# Patient Record
Sex: Male | Born: 1950 | Hispanic: No | Marital: Married
Health system: Northeastern US, Academic
[De-identification: ages and names within clinical notes are randomized; demographics above are authoritative.]

---

## 2020-01-25 IMAGING — CR Foot R
3 series · 3 of 3 positions shown · non-contrast
Comparison: none

Exam: Right foot 3 views
Patient has a history of an ulcer on the foot.
Significant soft tissue abnormality involving the lateral aspect of the foot at the base of the fifth toe. Erosive changes involving the proximal phalanx of the fifth toe and the distal aspect of the fifth metatarsal. Probable pathologic fractures through the base of the proximal phalanx of the fifth toe and the distal aspect of the fifth metatarsal. No radiopaque foreign body. No other bony abnormality.

[AP]
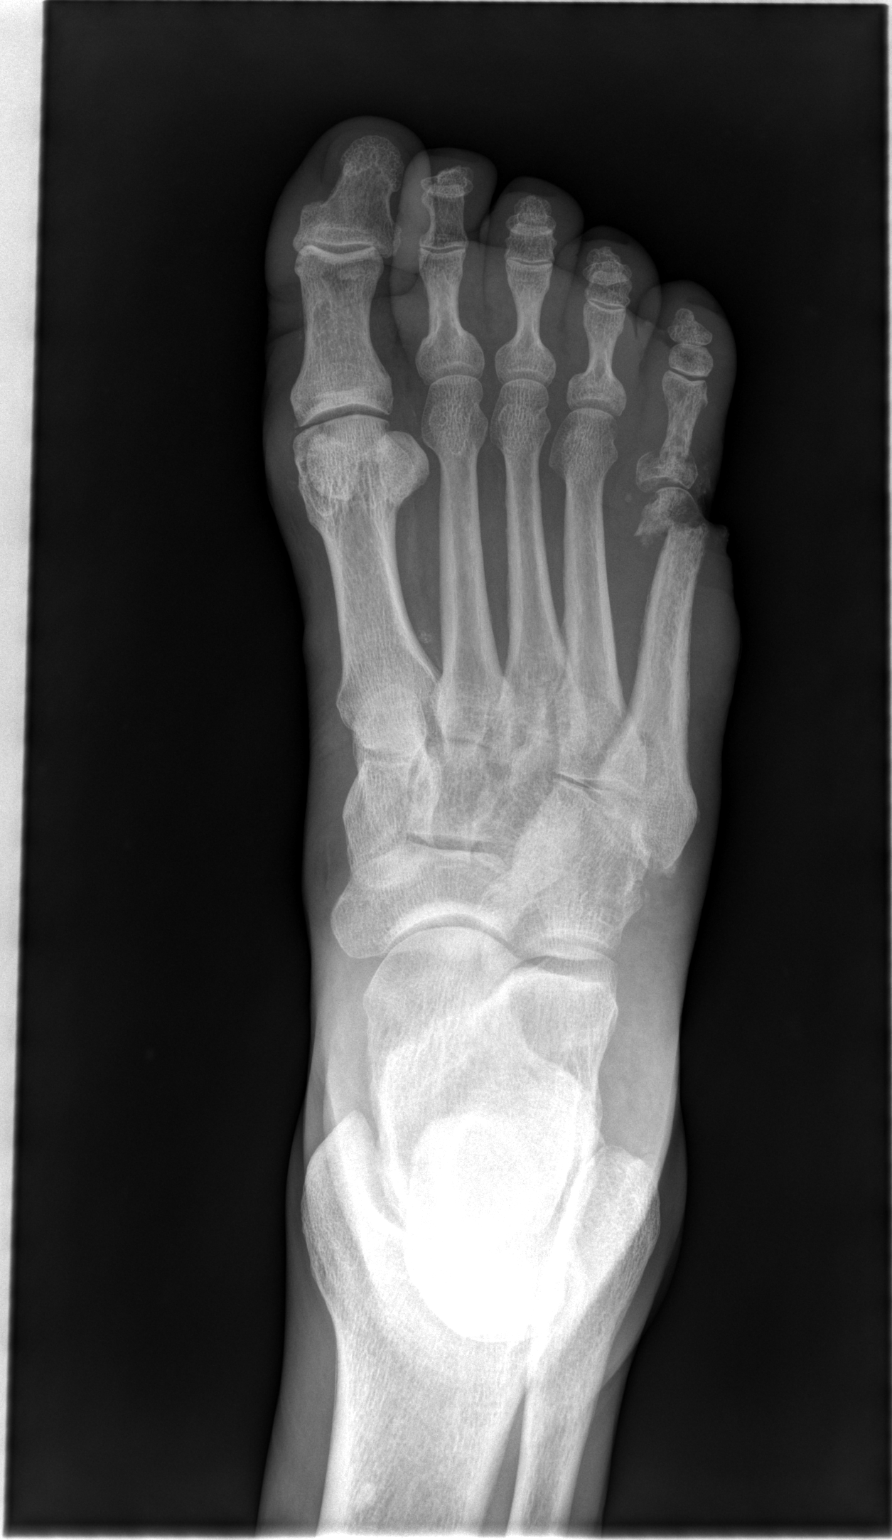

[oblique]
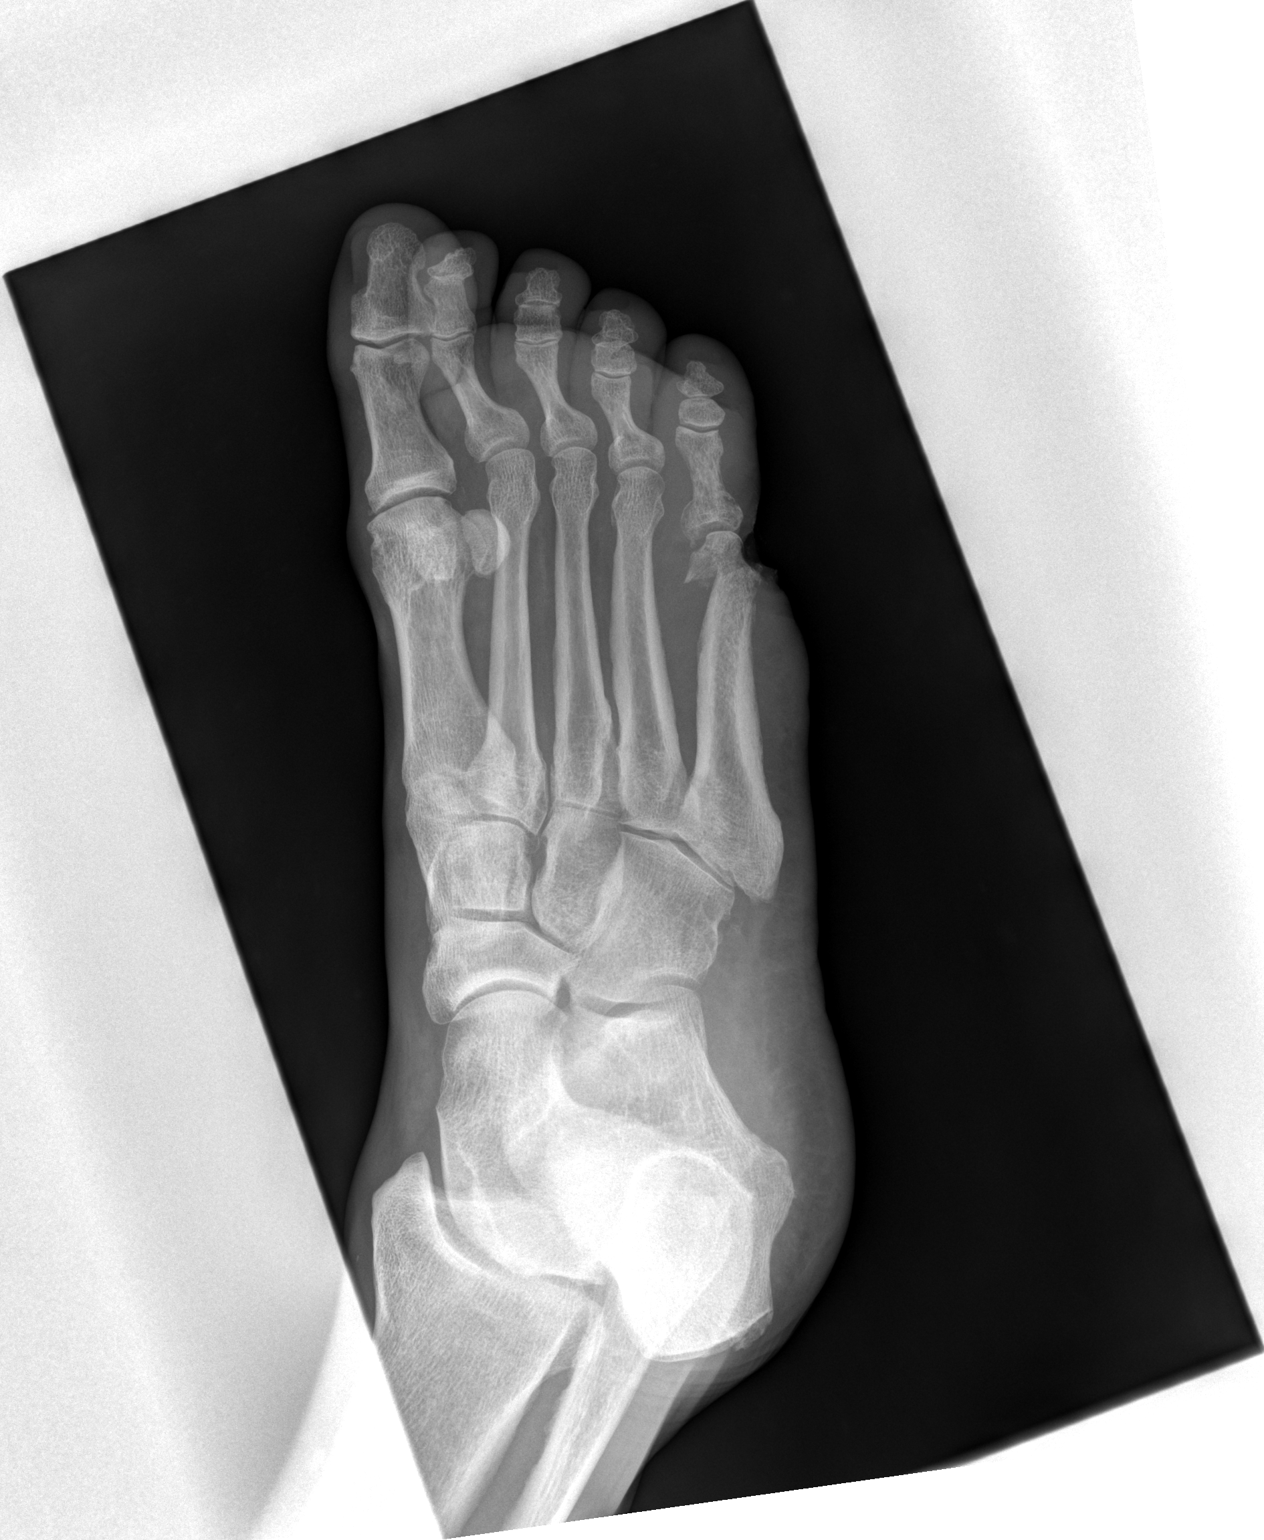

[lateral]
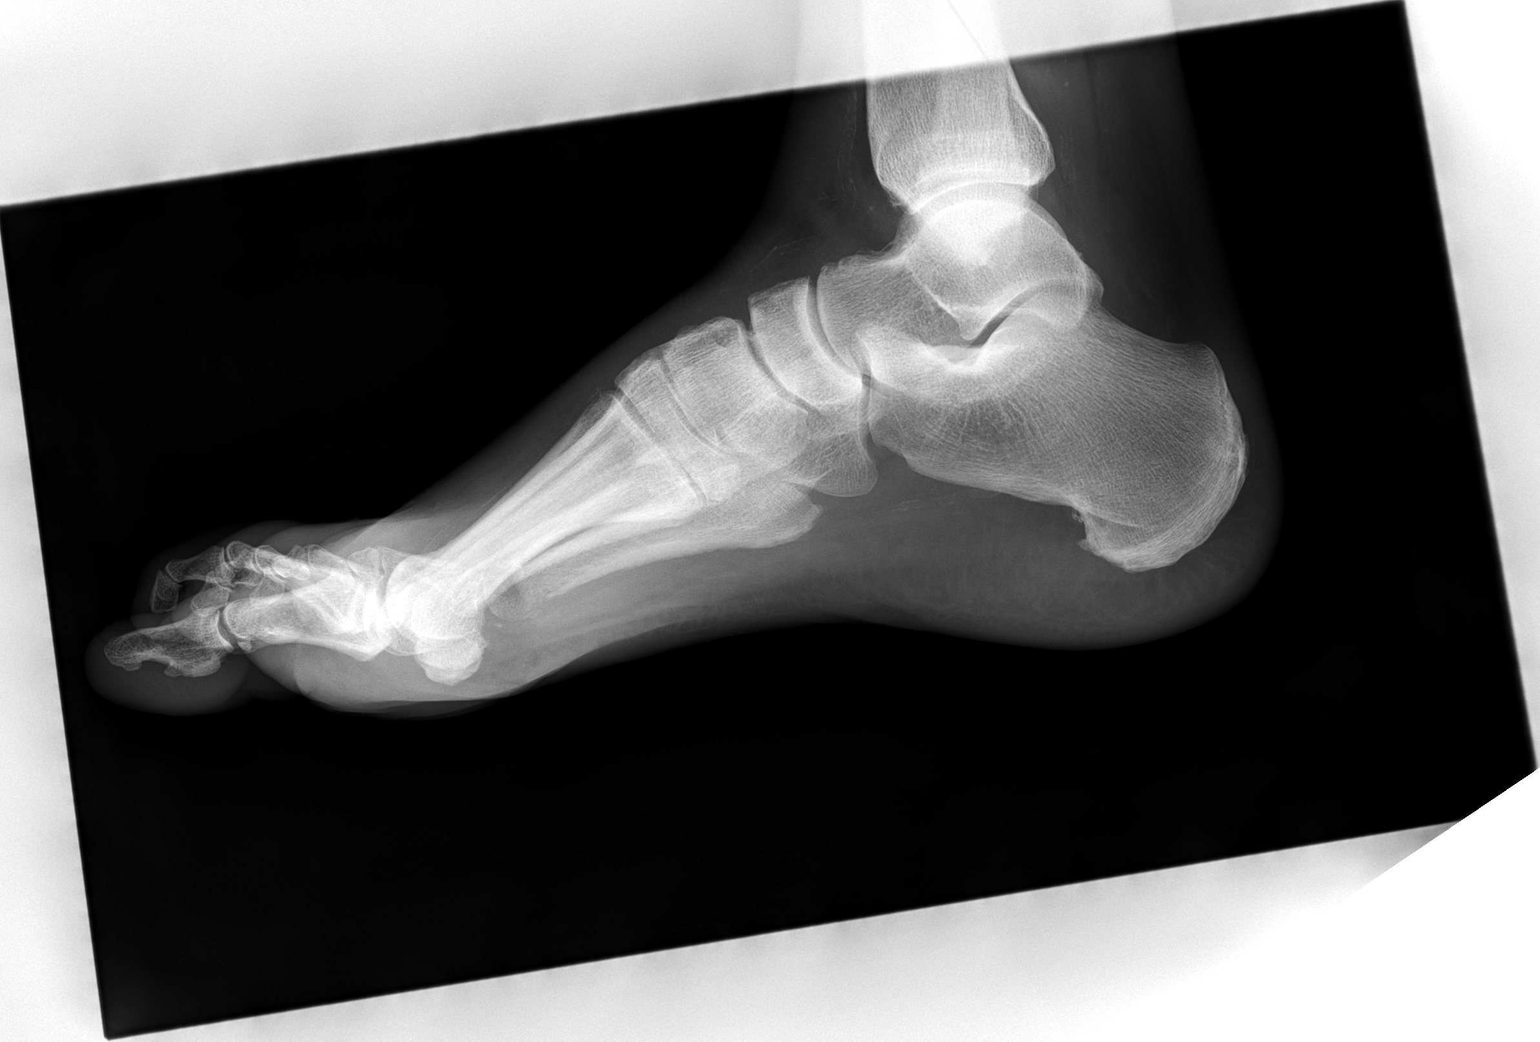

[3 of 3 positions shown; findings below may reference images not displayed]

IMPRESSION: Significant soft tissue abnormality at the level the fifth metatarsal phalangeal joint. Probable osteomyelitis involving the proximal phalanx of the fifth toe and the distal aspect of the fifth metatarsal. Probable pathologic fractures through the proximal phalanx of the fifth toe and the distal aspect of the fifth metatarsal.

## 2020-01-25 IMAGING — US US LOW EXT ARTERIES RIGHT
1 series · 13 of 16 positions shown · non-contrast
Comparison: none

Exam: Right lower extremity arterial ultrasound
CLINICAL HISTORY: Right-sided diabetic foot ulcer hypertension

[Series 1: us low ext arteries right · 13 of 44 slices shown]
[im 1/44]
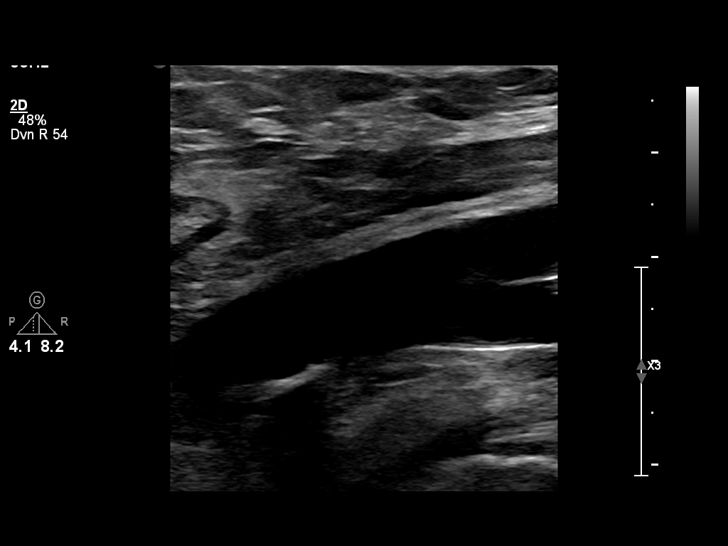
[im 3/44]
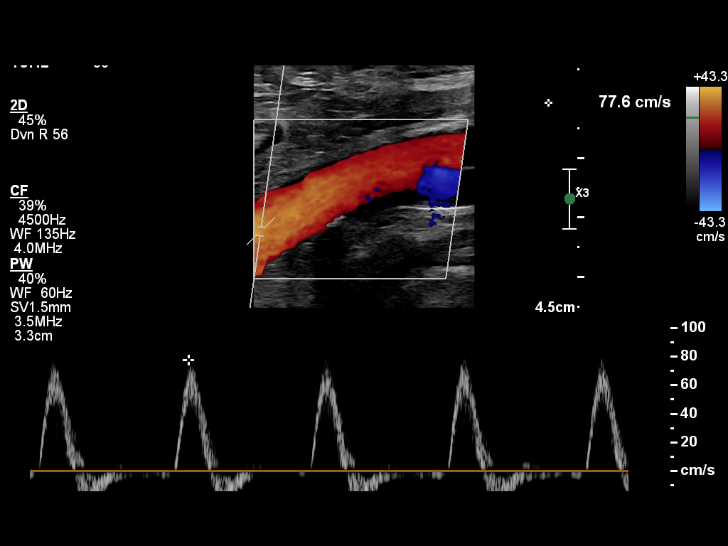
[im 9/44]
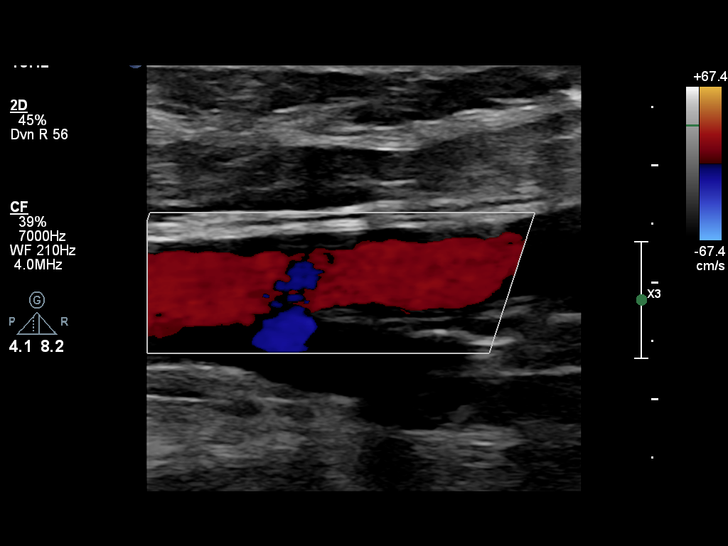
[im 12/44]
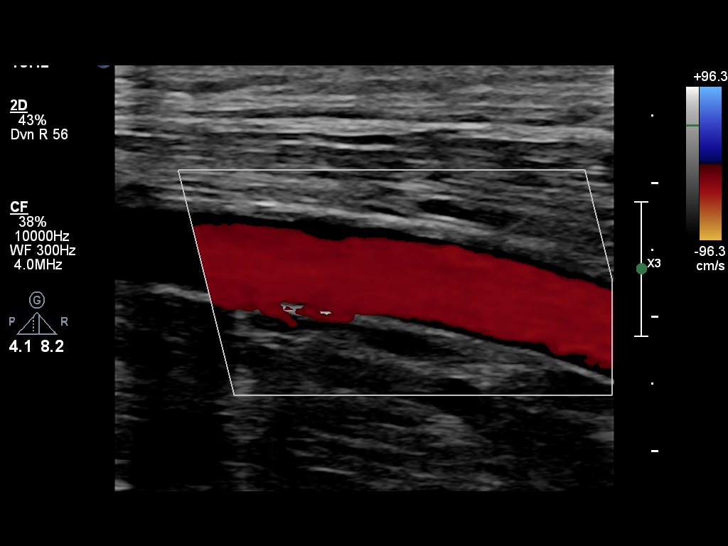
[im 15/44]
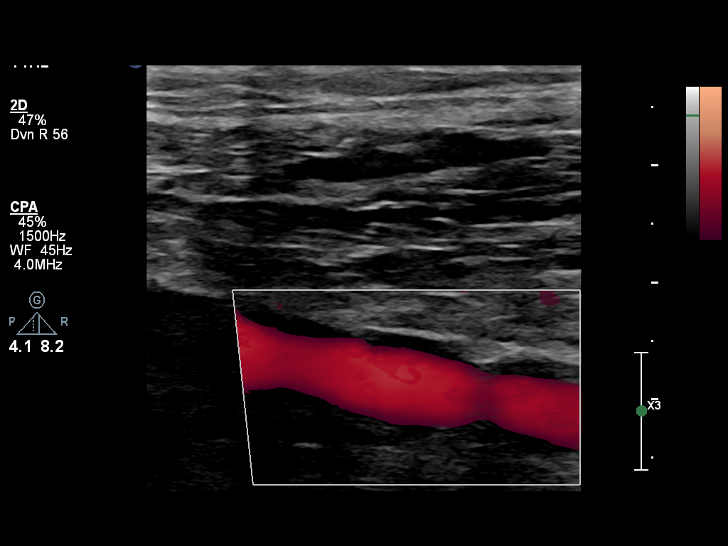
[im 18/44]
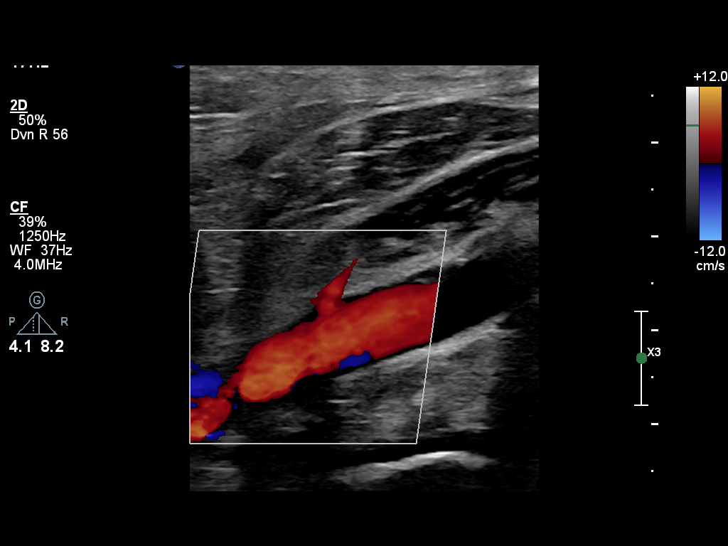
[im 23/44]
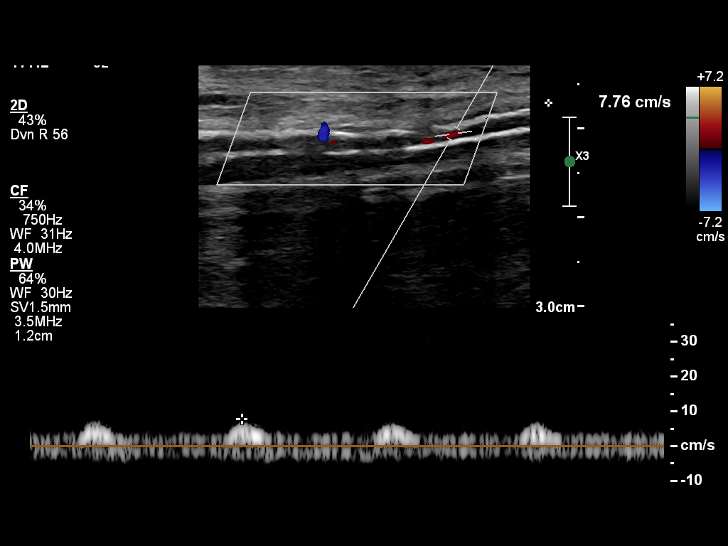
[im 26/44]
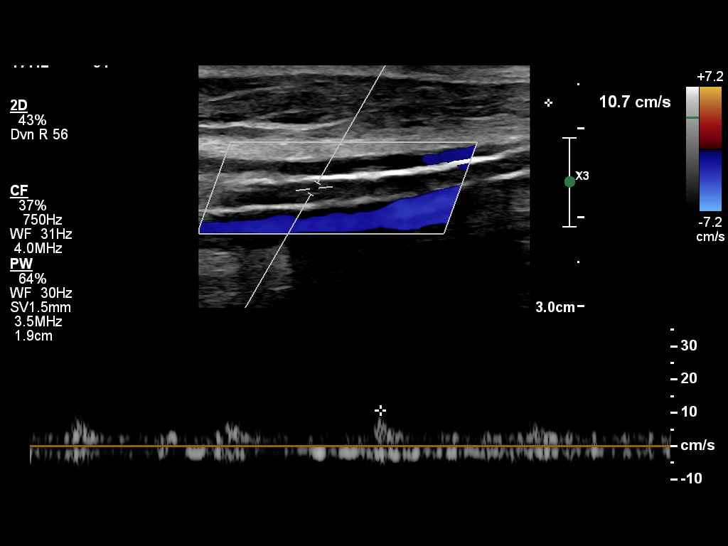
[im 29/44]
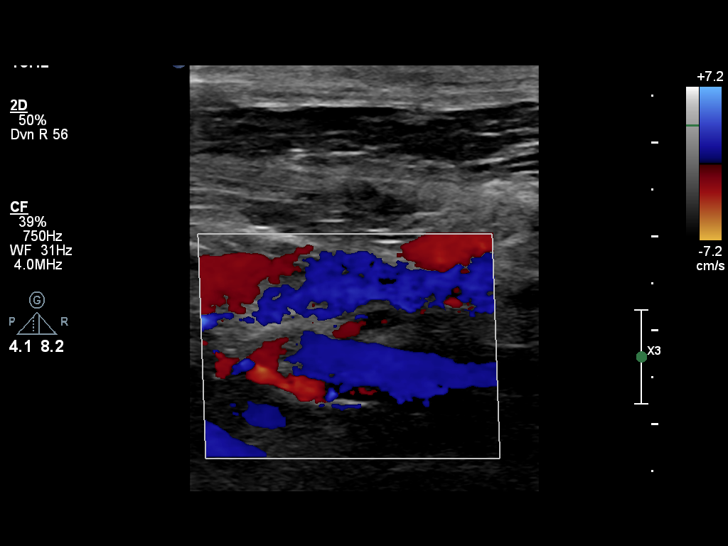
[im 32/44]
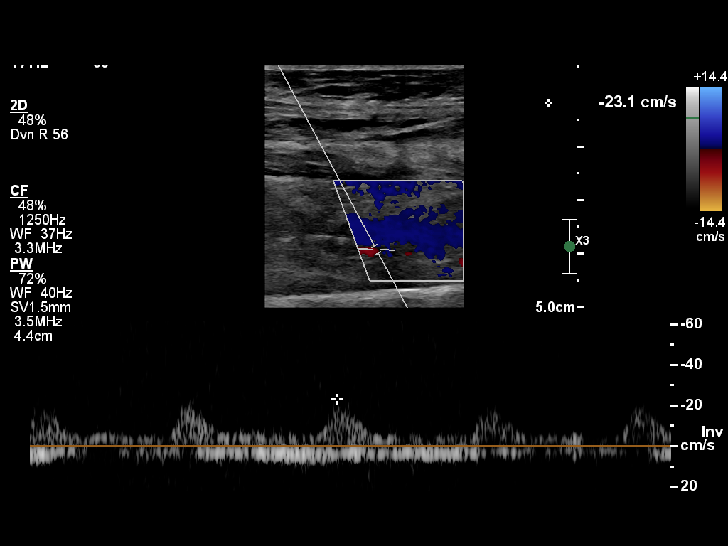
[im 35/44]
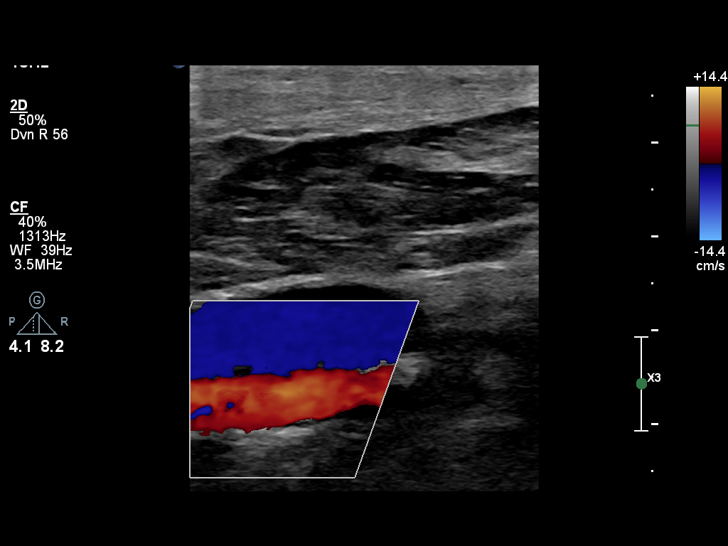
[im 41/44]
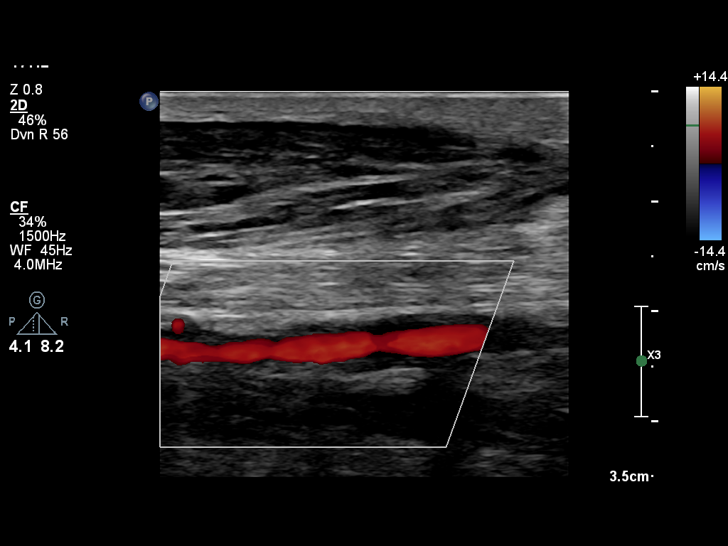
[im 44/44]
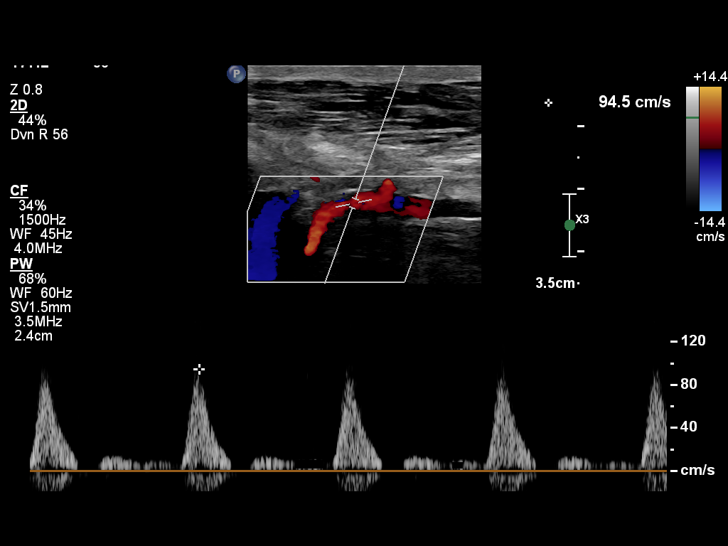

[13 of 16 positions shown; findings below may reference images not displayed]

FINDINGS: The right side: There is biphasic to triphasic waveform in the right common femoral artery. There is biphasic waveform in the right profunda femoris artery. There is triphasic waveform in the proximal mid and distal right superficial femoral artery. There is triphasic waveform in the proximal popliteal artery and the distal popliteal artery.
In the right posterior tibial artery there is biphasic waveform in the proximal segment with peak systolic velocity of 34 cm/s. In the mid posterior tibial artery the flow is very muted and monophasic at 11 cm/s. There is muted and slow flow that is monophasic in the distal right posterior tibial artery peak systolic velocity of 8 cm/s. This suggests significant stenosis in the proximal to mid posterior tibial artery.
In the right anterior tibial artery there is biphasic waveform in the proximal and mid segments.. There is biphasic waveform in the distal segment. There is only monophasic waveform significantly muted and slow in the right dorsalis pedis artery with peak systolic velocity of 12 cm/s. This suggests stenosis in the distal right anterior tibial artery.
There is monophasic waveform throughout the peroneal artery.
IMPRESSION: The appearance of likely stenosis, significant in the mid right posterior tibial and distal anterior tibial arteries as there is monophasic and muted flow more distally. Please see detailed discussion above.
Location 12

## 2020-03-22 IMAGING — US US LOW EXT ARTERIES BILAT
1 series · 13 of 16 positions shown · non-contrast
Comparison: none

REASON FOR EXAM: Peripheral arterial disease
STUDY PERFORMED: Bilateral lower extremity arterial duplex.

[Series 1: us low ext arteries bilat · 13 of 21 slices shown]
[im 1/21]
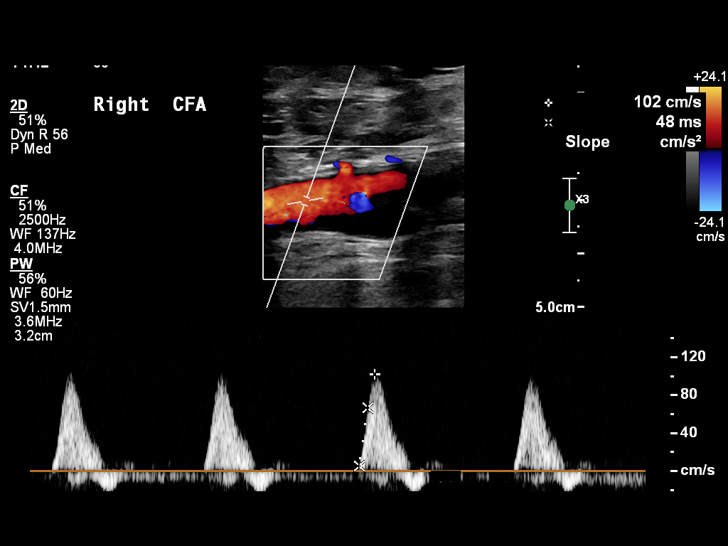
[im 2/21]
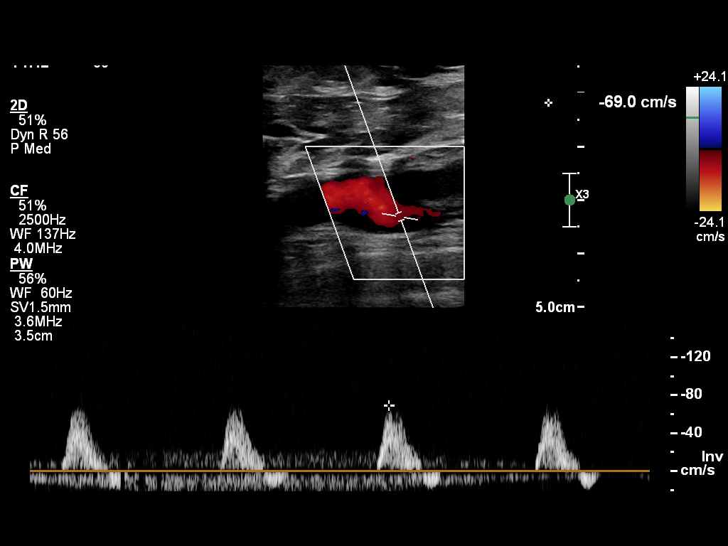
[im 5/21]
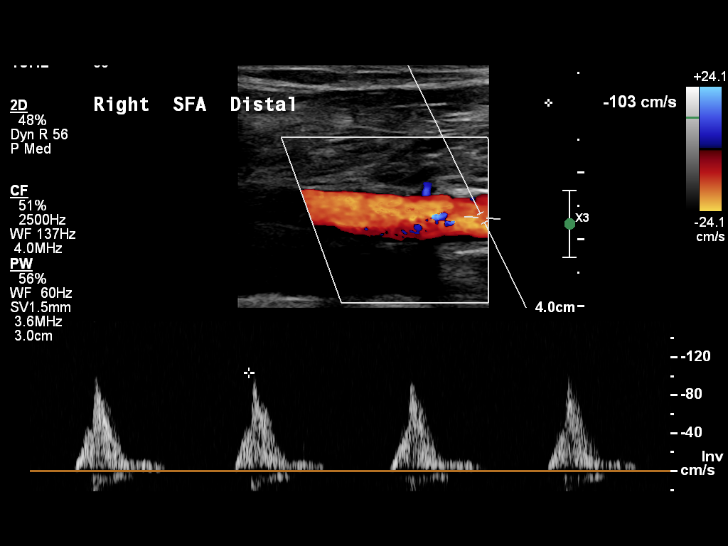
[im 6/21]
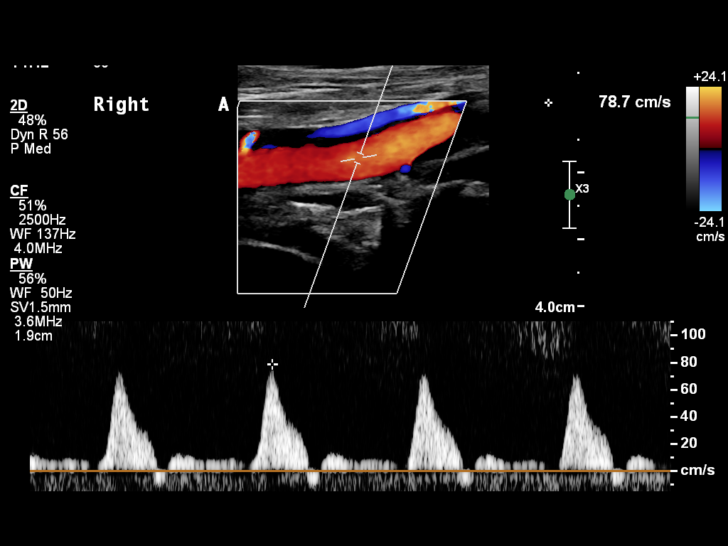
[im 7/21]
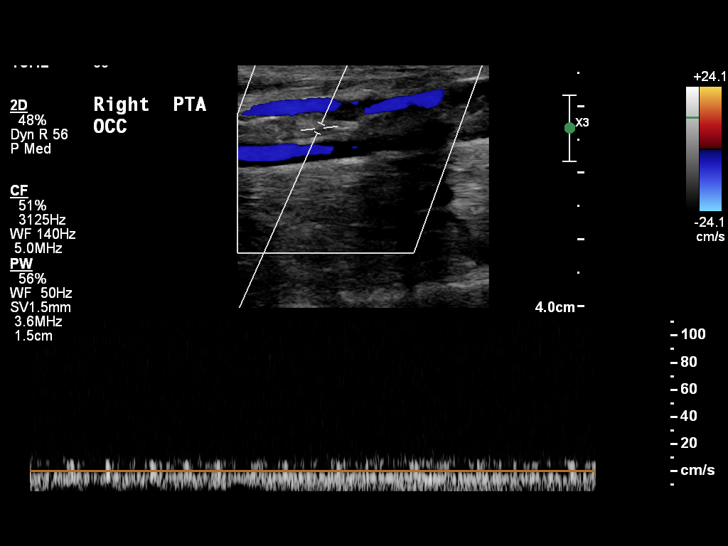
[im 9/21]
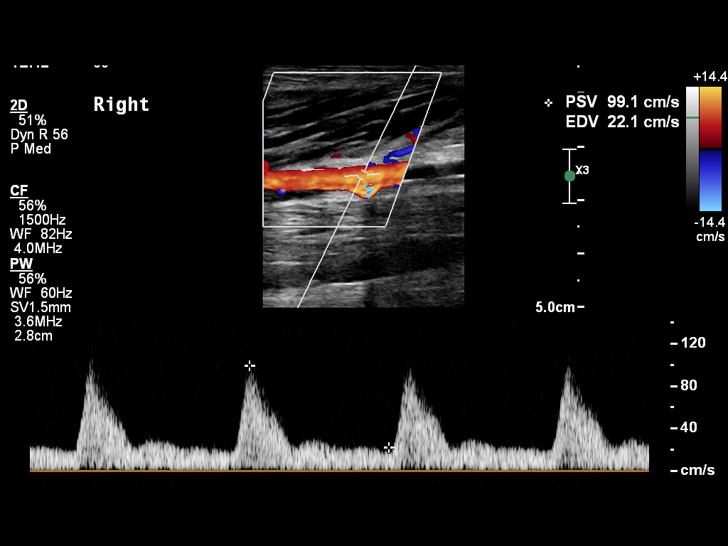
[im 11/21]
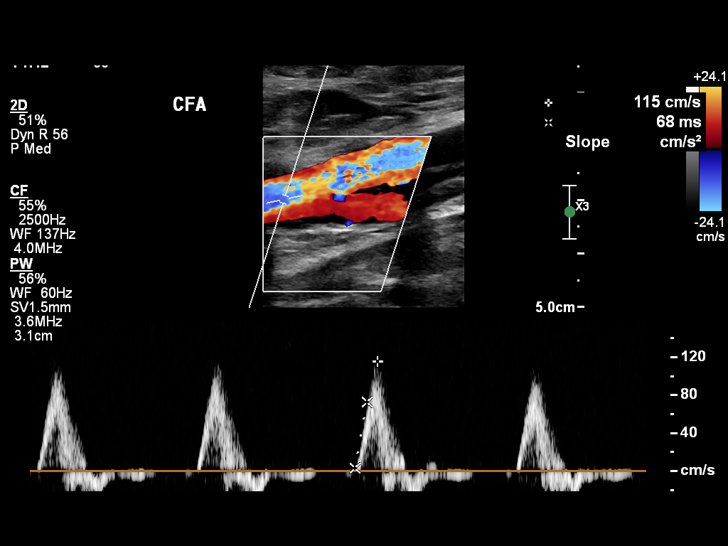
[im 13/21]
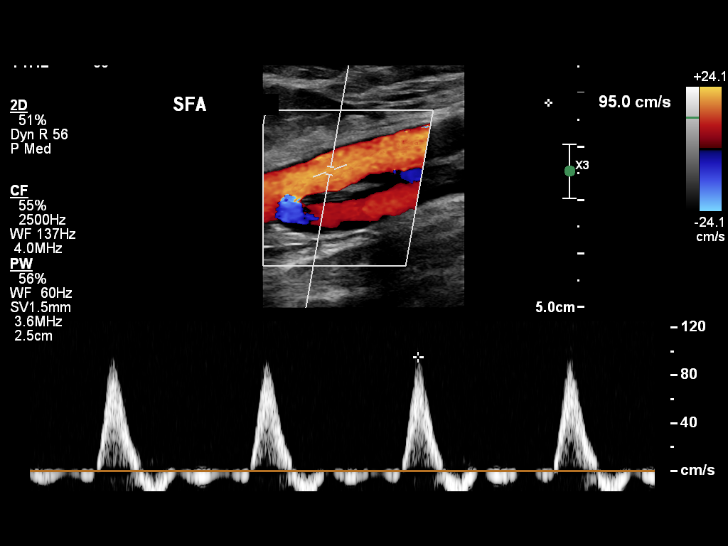
[im 14/21]
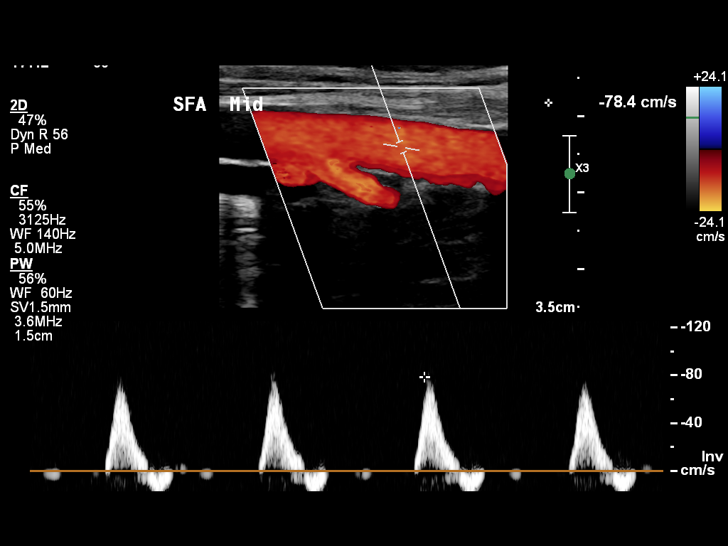
[im 15/21]
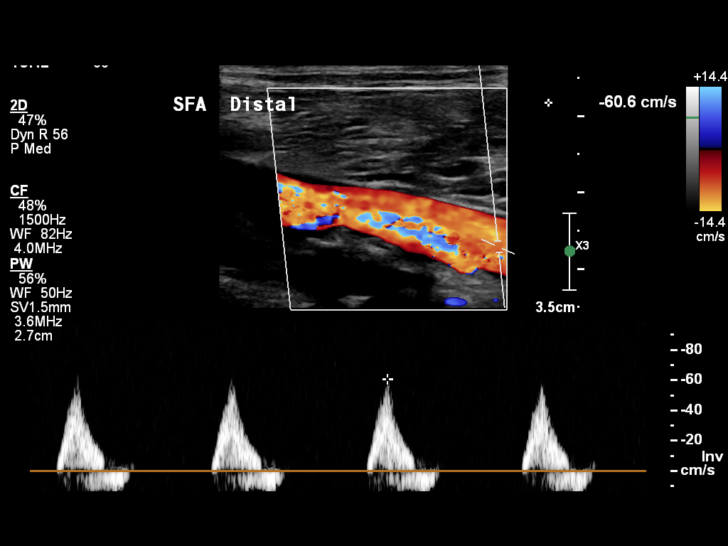
[im 17/21]
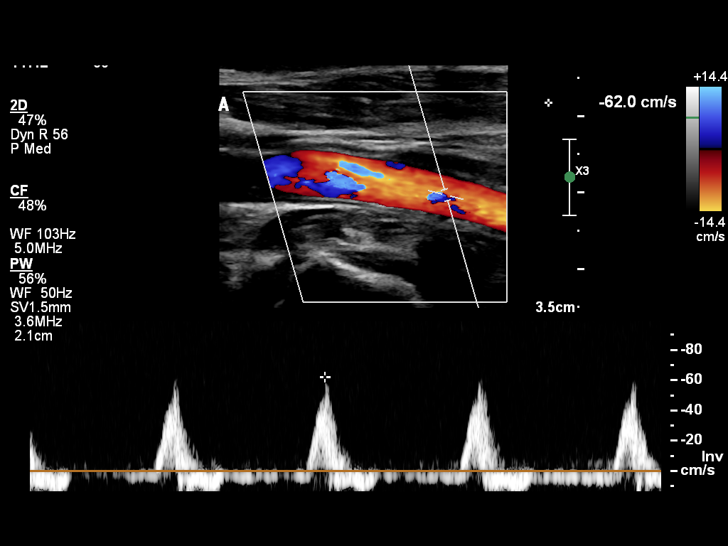
[im 19/21]
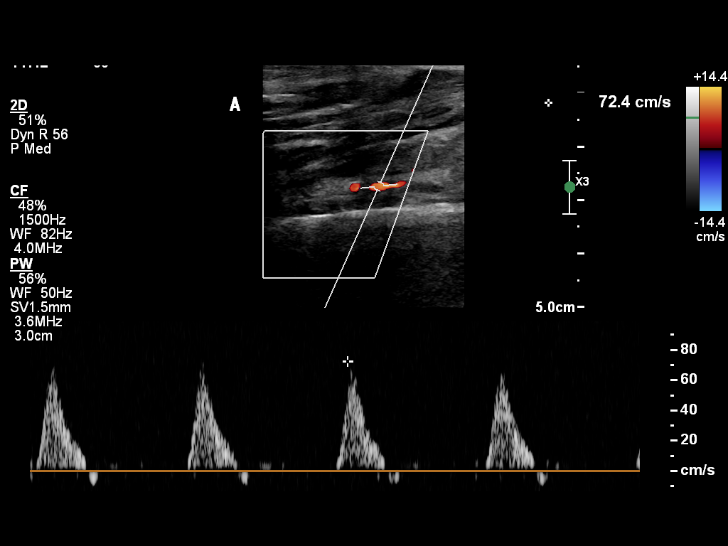
[im 21/21]
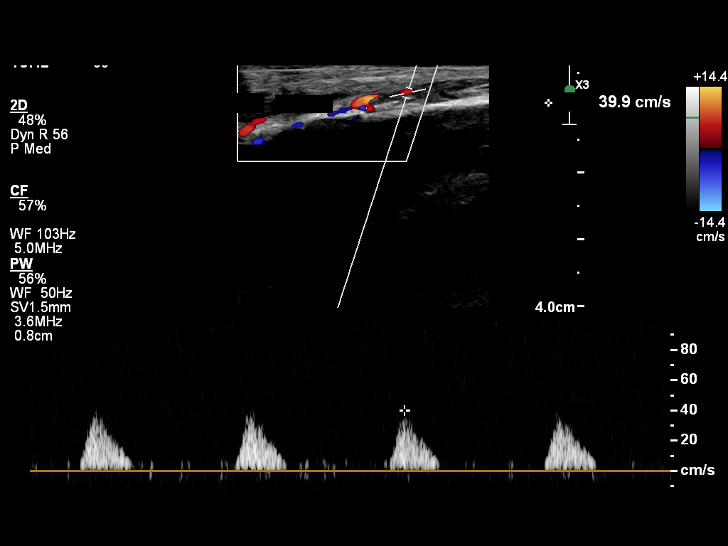

[13 of 16 positions shown; findings below may reference images not displayed]

FINDINGS: Right common femoral artery is patent with normal-appearing biphasic waveform. Right profunda femoris artery is patent. Right superficial femoral artery is patent with no focal velocity elevation to suggest stenosis. Right popliteal artery is patent with maintained biphasic waveform, as are the peroneal, anterior tibial, and dorsalis pedis arteries. Posterior tibial artery is occluded.
Left common femoral artery  patent with normal-appearing biphasic waveform. Left profunda femoris artery is patent. Left superficial femoral artery is patent with no focal velocity elevation to suggest stenosis. Left popliteal artery is patent with maintained biphasic waveform, as is the peroneal artery. The posterior tibial artery is occluded. Anterior tibial is retrograde with dorsalis pedis reconstituted antegrade likely from peroneal collaterals with relatively good velocity 40 cm/s.
IMPRESSION: 1.  Tibial vessel occlusive disease is seen bilaterally with the right posterior tibial artery occluded and left lower extremity with peroneal artery only runoff.

## 2021-06-18 IMAGING — CR XR FOOT 3+ VIEWS LEFT
1 series · 3 of 3 positions shown · non-contrast
Comparison: 421

FINAL REPORT:
XR FOOT 3+ VIEWS LEFT
INDICATION: Cellulitis of left lower limb

[Series 3656: AP · right · 3 of 3 slices shown]
[im 1/3]
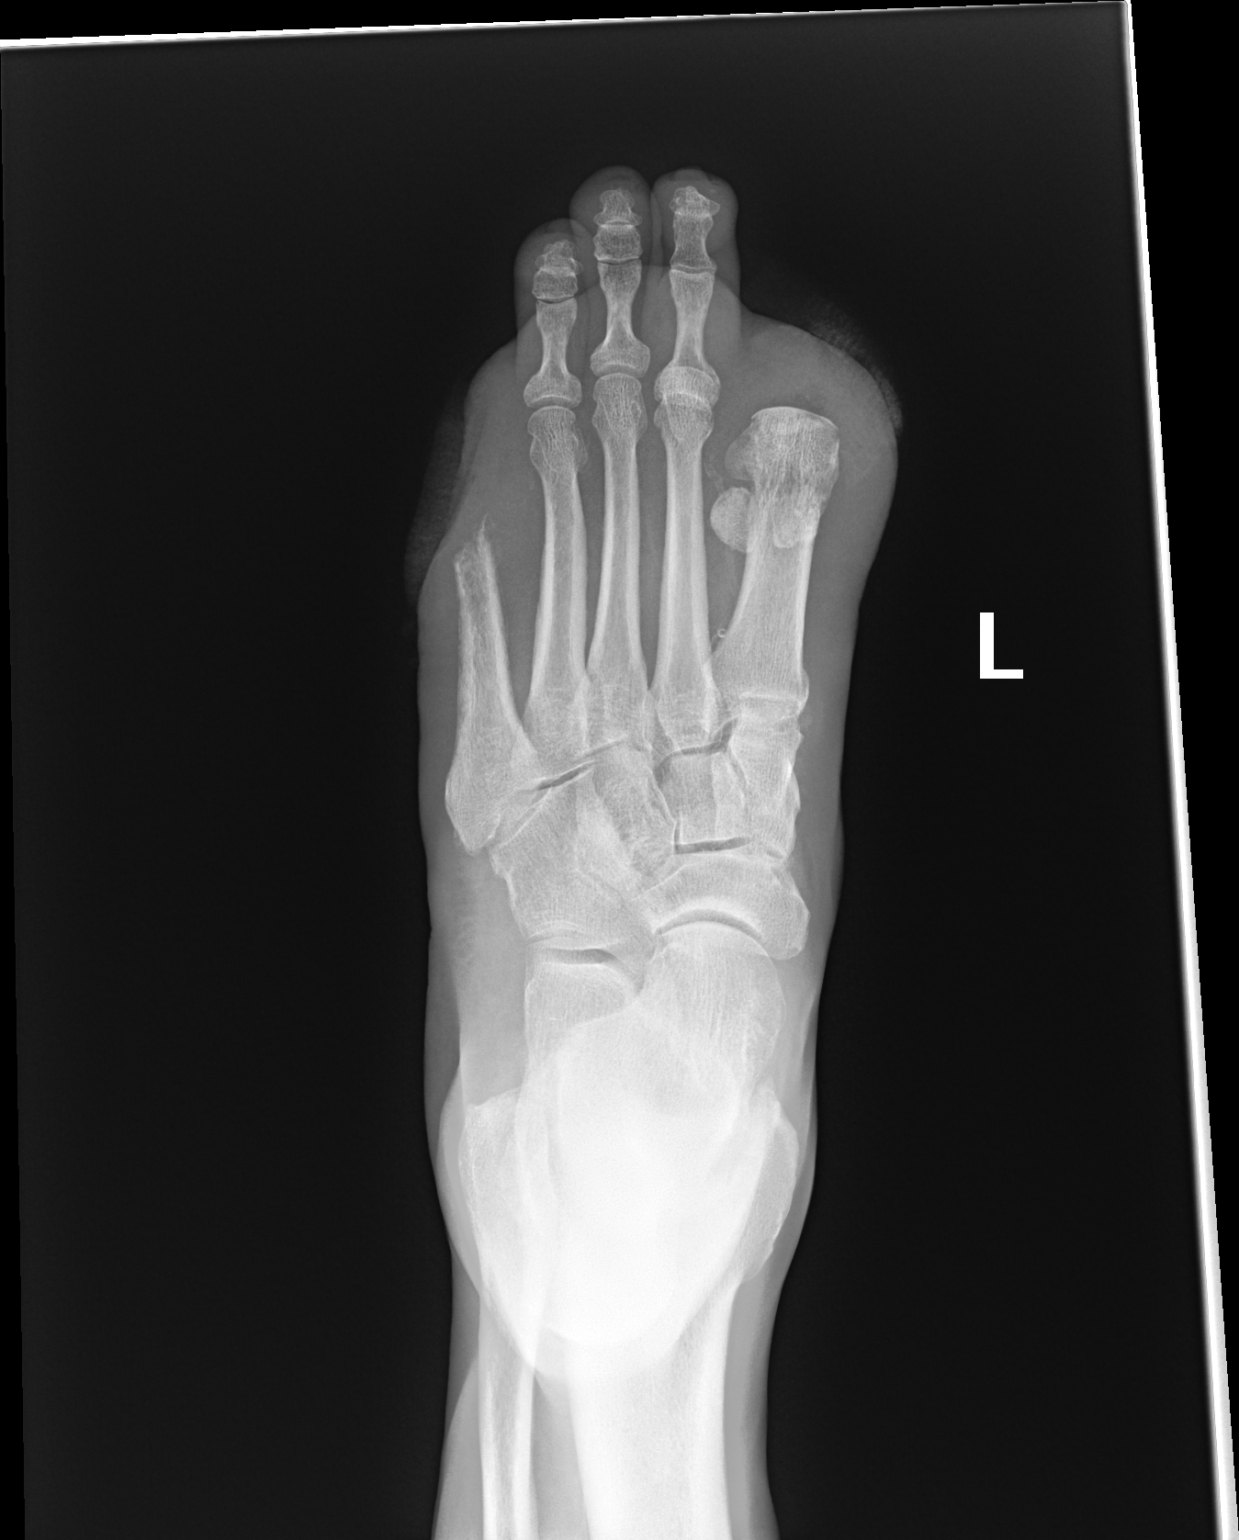
[im 2/3]
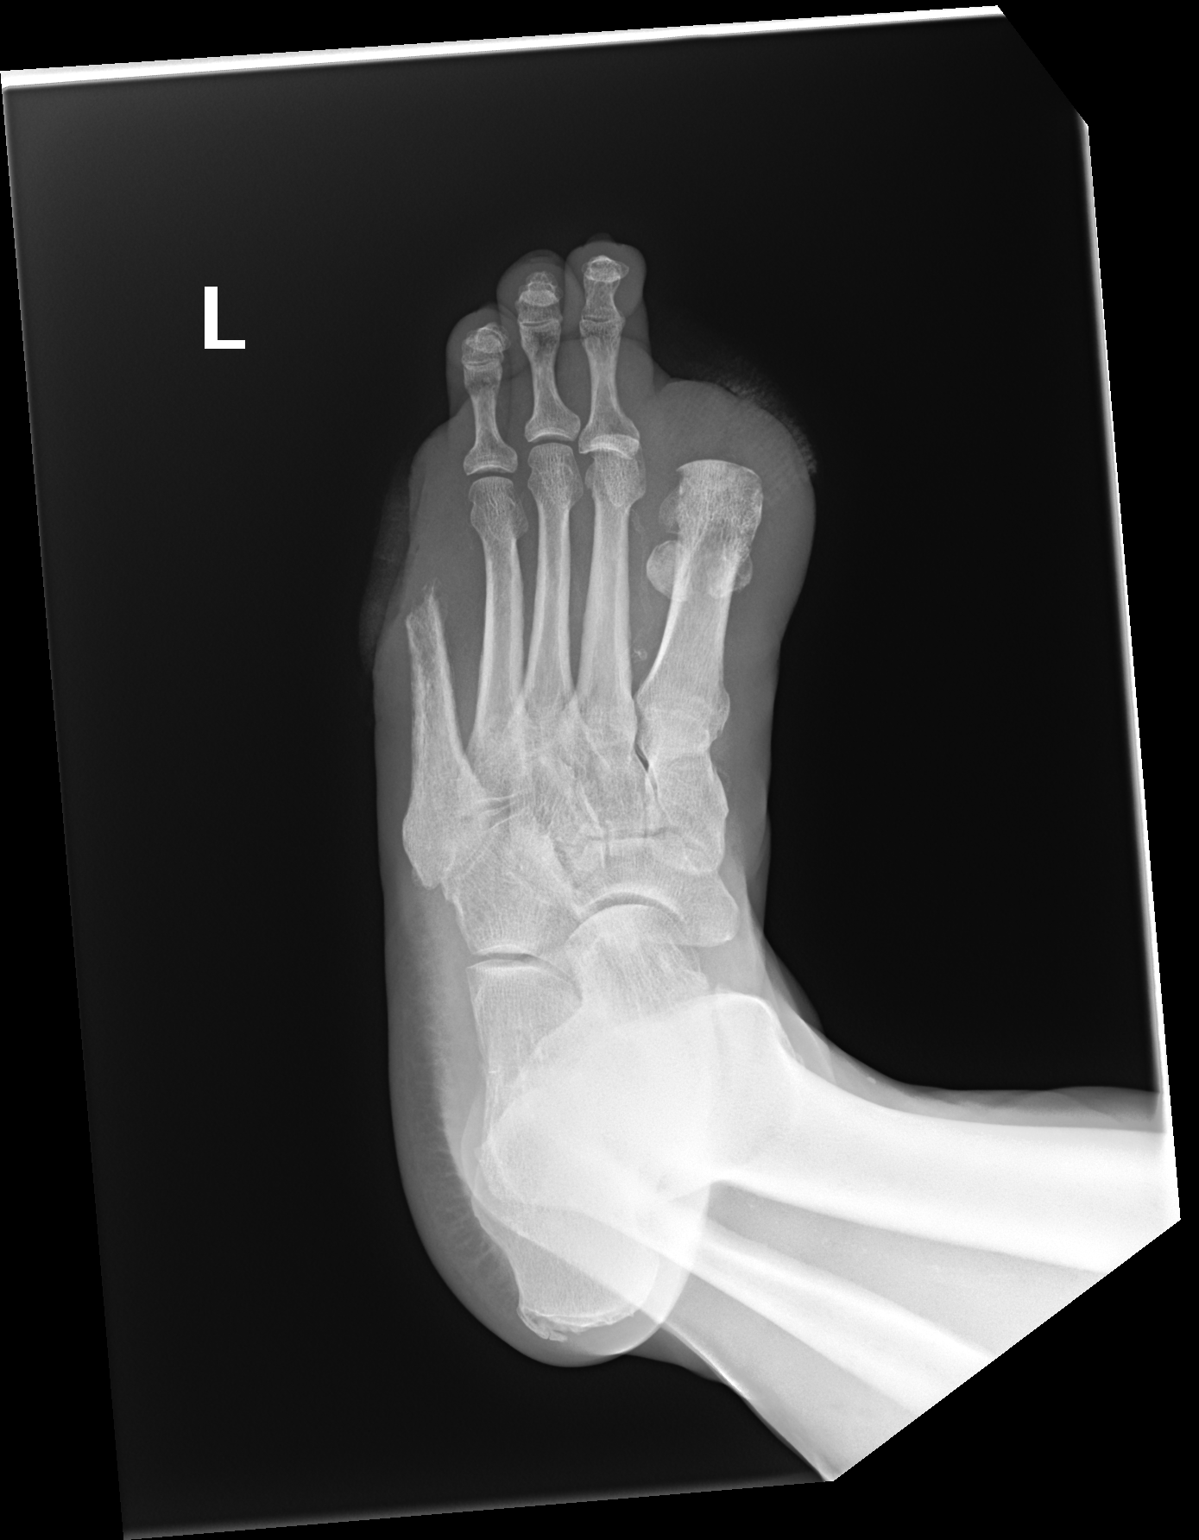
[im 3/3]
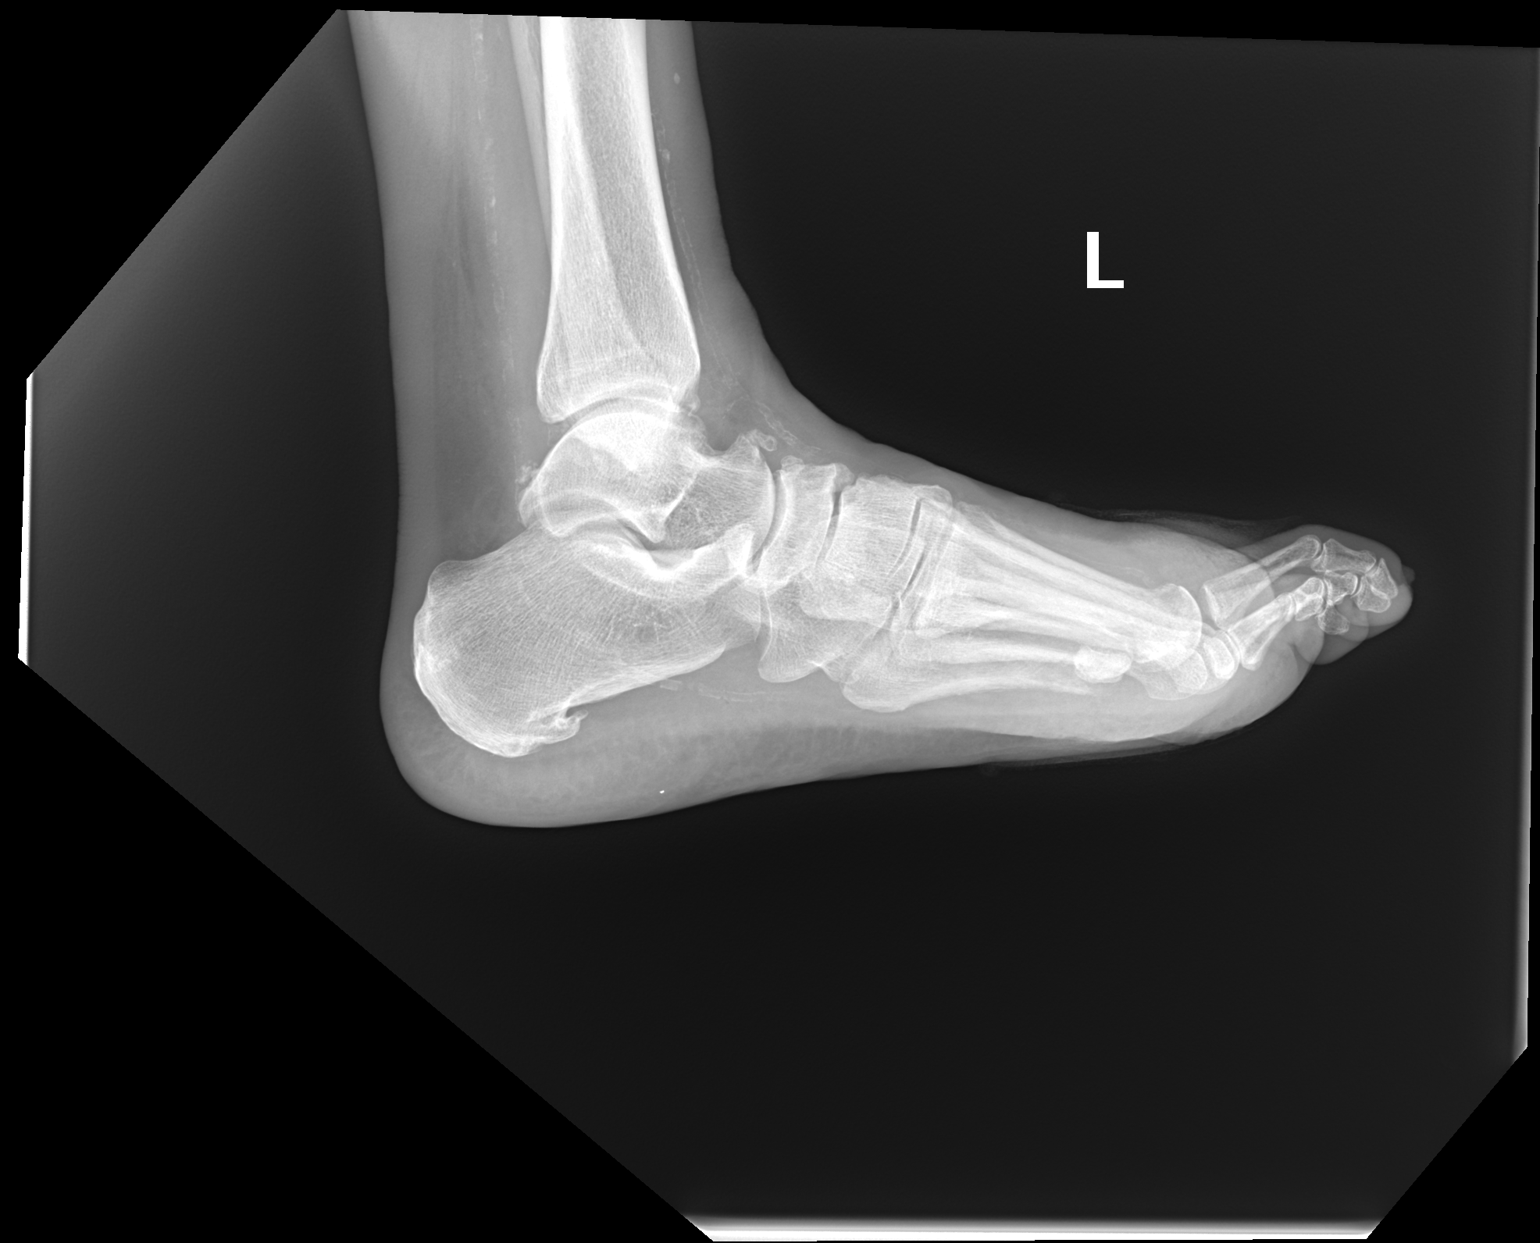

[3 of 3 positions shown; findings below may reference images not displayed]

FINDINGS: 3 views were obtained.
There has been resection of the first digit at the first metatarsophalangeal joint and fifth digits in the transmetatarsal location. There is a plantar calcaneal spur. Mild soft tissue swelling is seen over both amputation sites. No periosteal reaction or erosions or joint space abnormality is identified. Joint spaces are maintained. There are vascular calcifications throughout the colon and foot. There is a small plantar calcaneal spur.
IMPRESSION: Postoperative changes as above without findings of osteomyelitis. If this is persistent clinical concern recommend three-phase bone scan. Tissue swelling is likely due to cellulitis.

## 2021-07-04 IMAGING — MR MRI FOOT LEFT WITH AND WITHOUT IV CONTRAST
10 series · 40 of 40 positions shown · IV contrast (agent unspecified)
Comparison: None available

FINAL REPORT:
HISTORY: Foot ulceration
Procedure: MRI of the left foot without and with contrast
TECHNIQUE: Multisequence, multiplanar imaging of the left foot was performed.

[Series 2: survey · axial · left · 6.0mm · 0.59mm/px · 1 of 11 slices shown]
[im 1/11]
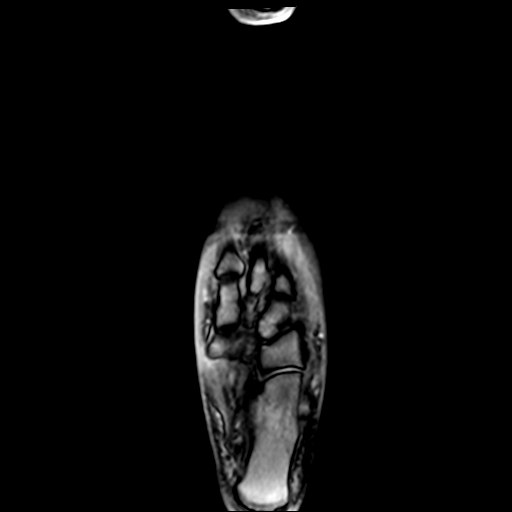

[Series 3: T2 fat-sat · coronal · left · 4.0mm · 0.47mm/px · 5 of 30 slices shown (1 of 2)]
[im 1/30]
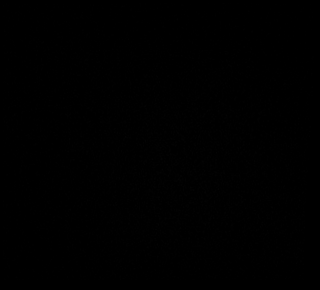
[im 8/30]
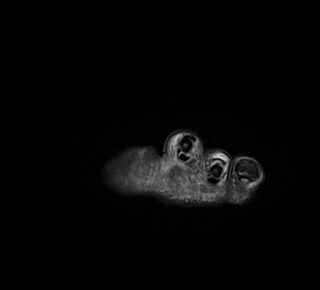
[im 15/30]
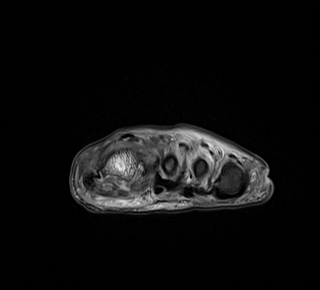
[im 22/30]
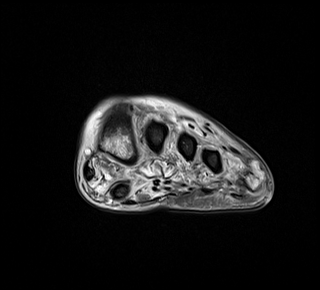
[im 30/30]
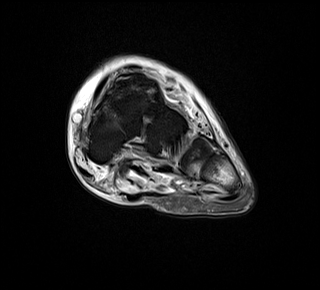

[Series 4: T1 · coronal · left · 4.0mm · 0.47mm/px · 5 of 30 slices shown (1 of 3)]
[im 1/30]
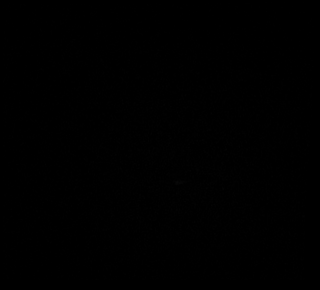
[im 8/30]
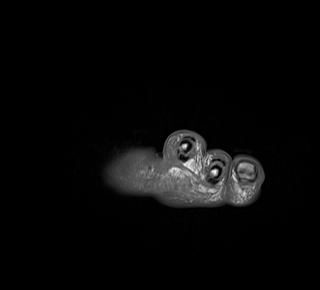
[im 15/30]
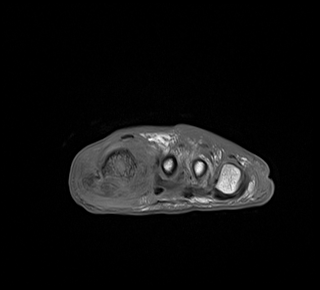
[im 22/30]
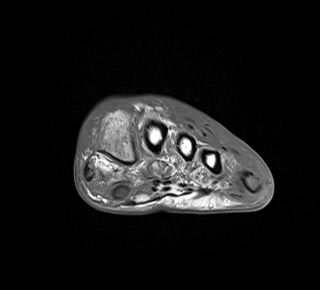
[im 30/30]
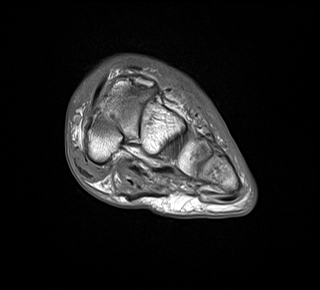

[Series 5: T1 · axial · left · 4.0mm · 0.42mm/px · z∈[-65,+18]mm · 3 of 20 slices shown (2 of 3)]
[im 1/20]
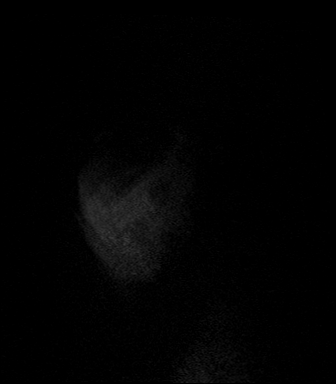
[im 10/20]
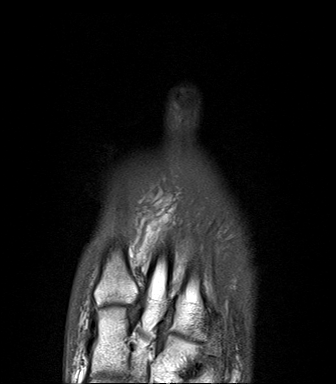
[im 20/20]
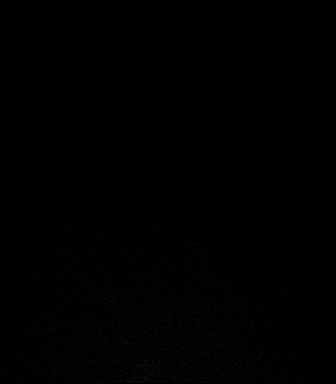

[Series 6: T2 fat-sat · axial · left · 4.0mm · 0.42mm/px · z∈[-65,+18]mm · 3 of 20 slices shown (2 of 2)]
[im 1/20]
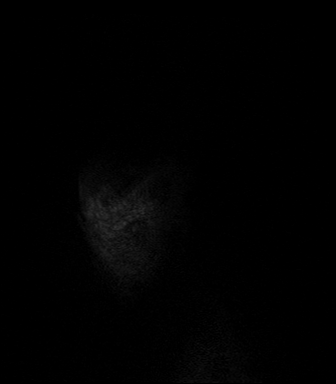
[im 10/20]
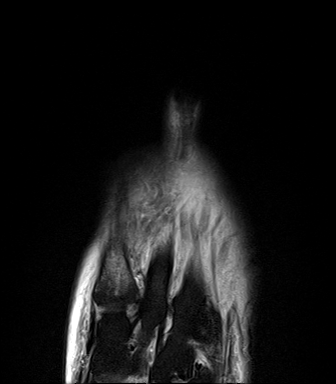
[im 20/20]
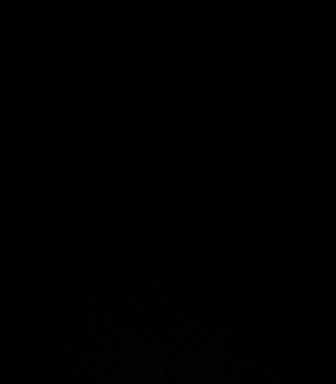

[Series 7: STIR · sagittal · left · 3.0mm · 0.50mm/px · 5 of 30 slices shown]
[im 1/30]
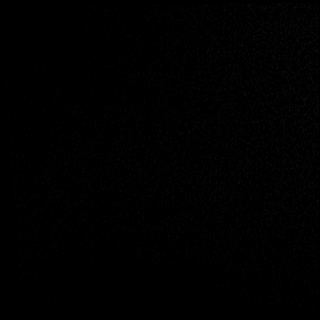
[im 8/30]
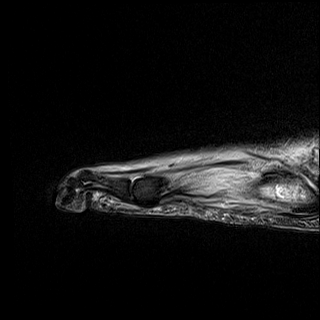
[im 15/30]
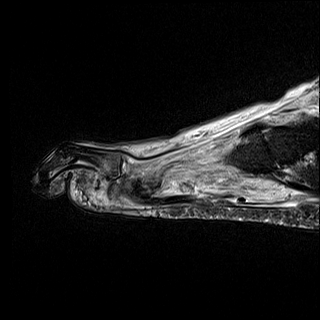
[im 22/30]
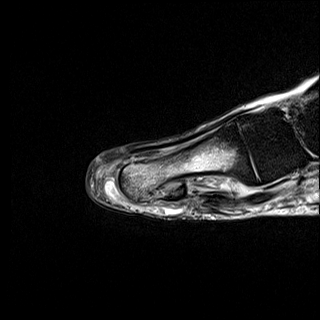
[im 30/30]
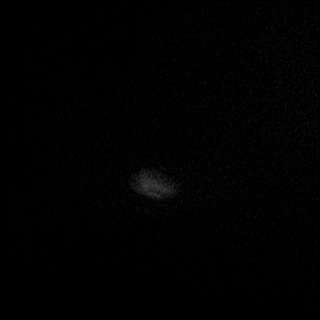

[Series 8: T1 · sagittal · left · 3.0mm · 0.50mm/px · 5 of 30 slices shown (3 of 3)]
[im 1/30]
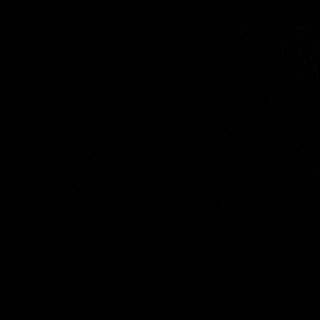
[im 8/30]
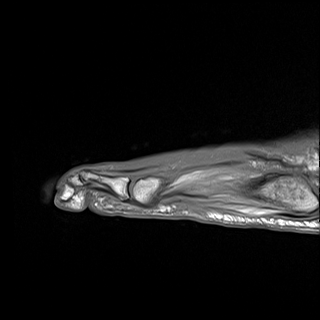
[im 15/30]
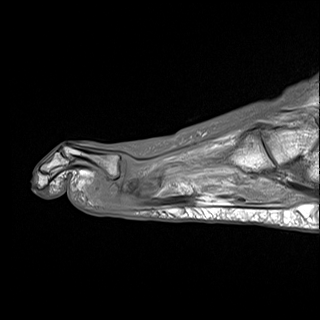
[im 22/30]
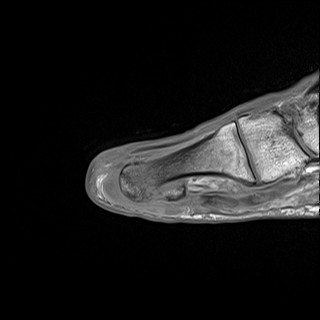
[im 30/30]
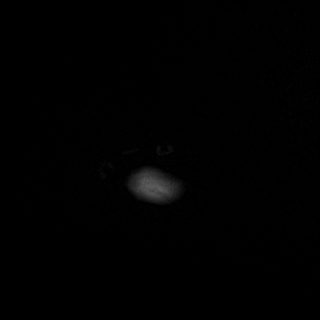

[Series 9: T1 fat-sat post-contrast · axial · left · 4.0mm · 0.42mm/px · z∈[-65,+18]mm · 3 of 20 slices shown (1 of 3)]
[im 1/20]
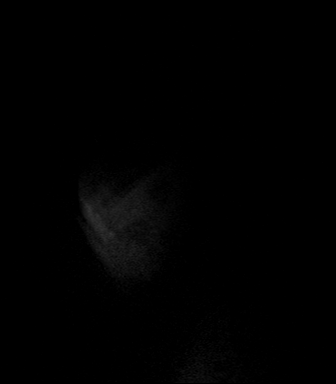
[im 10/20]
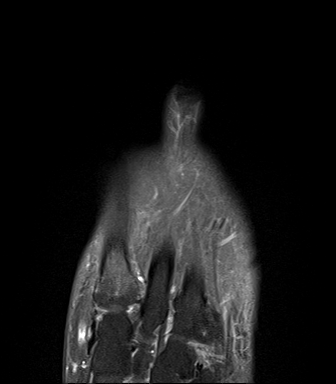
[im 20/20]
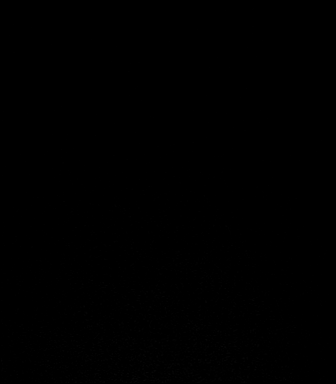

[Series 10: T1 fat-sat post-contrast · coronal · left · 4.0mm · 0.47mm/px · 5 of 30 slices shown (2 of 3)]
[im 1/30]
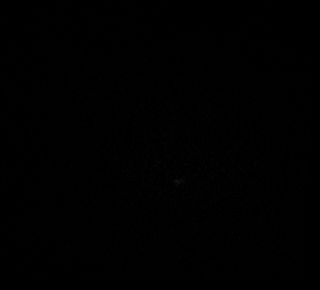
[im 8/30]
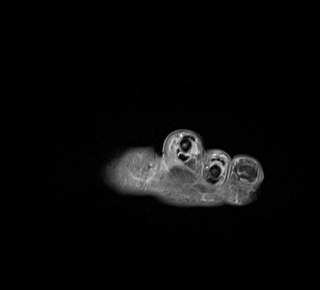
[im 15/30]
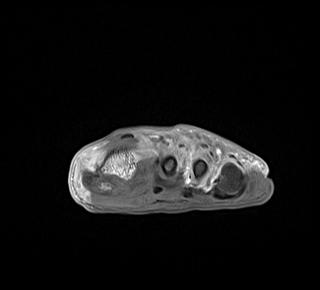
[im 22/30]
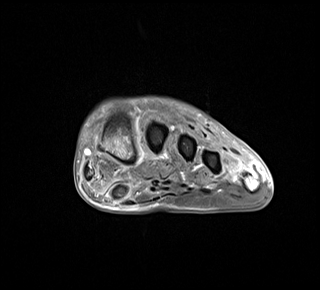
[im 30/30]
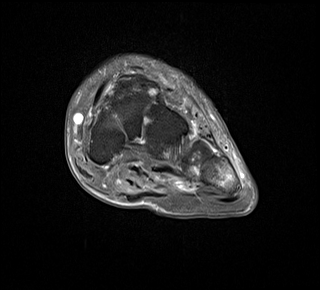

[Series 11: T1 fat-sat post-contrast · sagittal · left · 3.0mm · 0.50mm/px · 5 of 30 slices shown (3 of 3)]
[im 1/30]
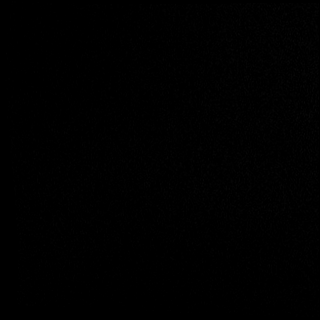
[im 8/30]
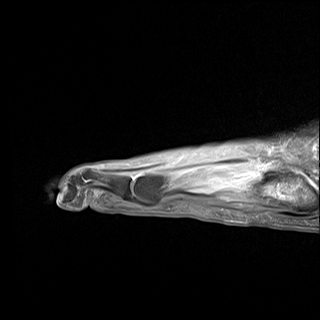
[im 15/30]
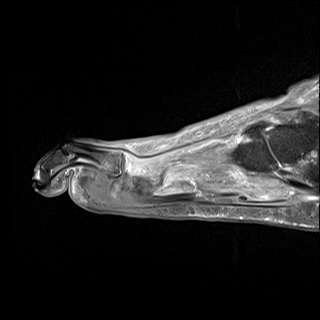
[im 22/30]
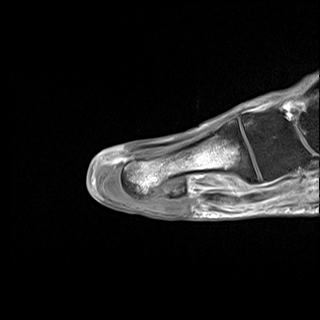
[im 30/30]
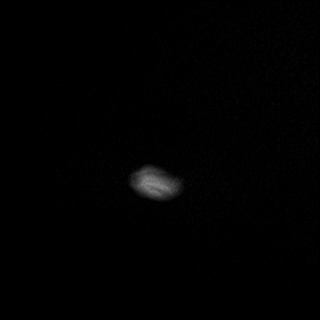

[40 of 40 positions shown; findings below may reference images not displayed]

FINDINGS: Edema-like signal throughout the first metatarsal. There is corresponding T1 hypointensity. There is amputation of the phalanges. There is an ulcer seen extending to the first metatarsal head. Edema and T1 hypointensity within the sesamoids. Edema of the second metatarsal head is present. There is no definitive T1 hypointensity. There is dorsal dislocation of the second phalanges. There is dorsal and plantar edema. Laxity of the flexor analysis longus tendon from amputation. The remainder of the tendons are unremarkable.
IMPRESSION: 
IMPRESSION: 1. Osteomyelitis the majority of the shaft of the first metatarsal and the adjacent sesamoids of the metatarsal head
2. Edema like signal in the second metatarsal head likely reactive osteitis
3. Dorsal dislocation of the second phalanges

## 2021-09-24 IMAGING — DX XR FOOT 3+ VIEWS LEFT
1 series · 3 of 3 positions shown · non-contrast
Comparison: none

FINAL REPORT:
XR FOOT 3+ VIEWS LEFT
INDICATION: Provided clinical information: "Foot pain" Additional information: Pain.
TECHNIQUE: Radiographs. Site / views listed above.
_______________

[Series 4186: AP · left · 0.20mm/px · 3 of 3 slices shown]
[im 1/3]
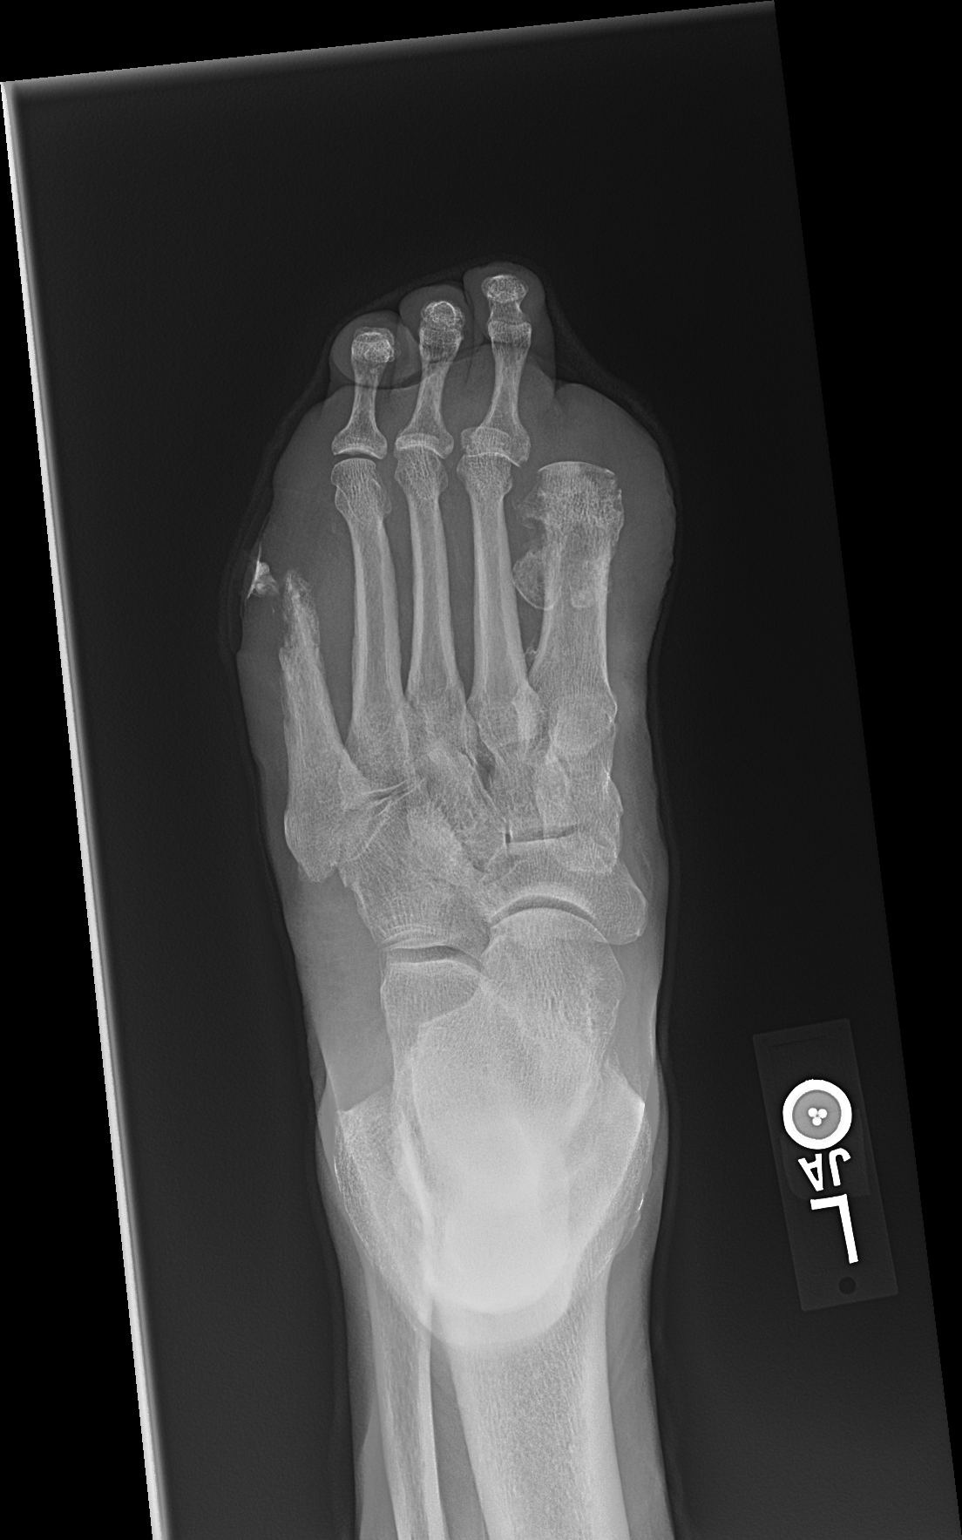
[im 2/3]
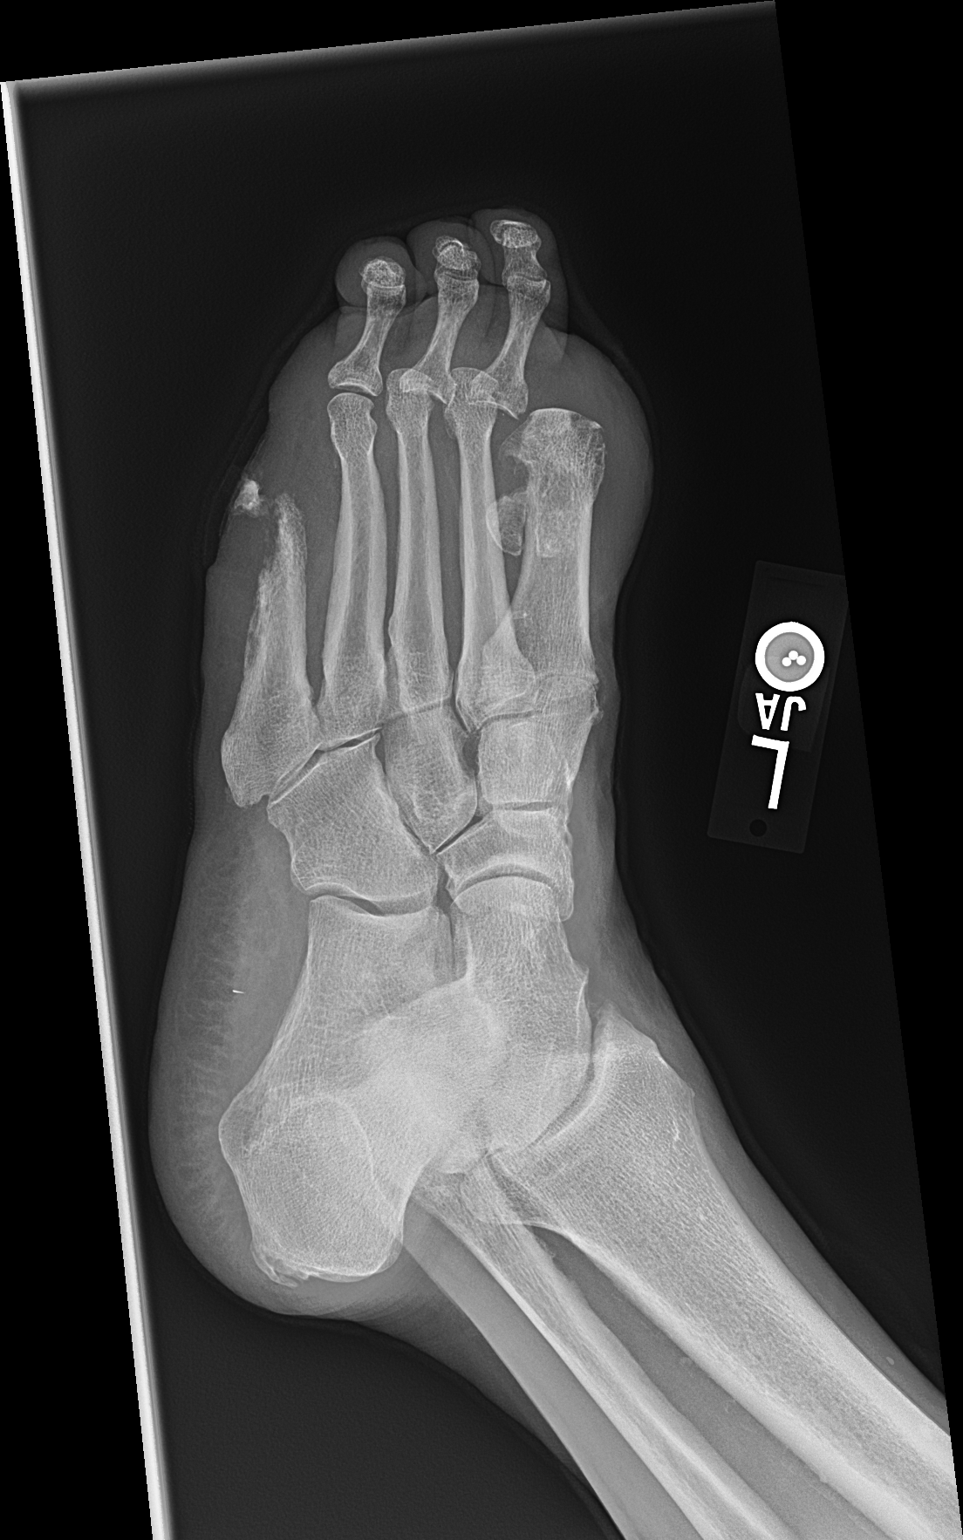
[im 3/3]
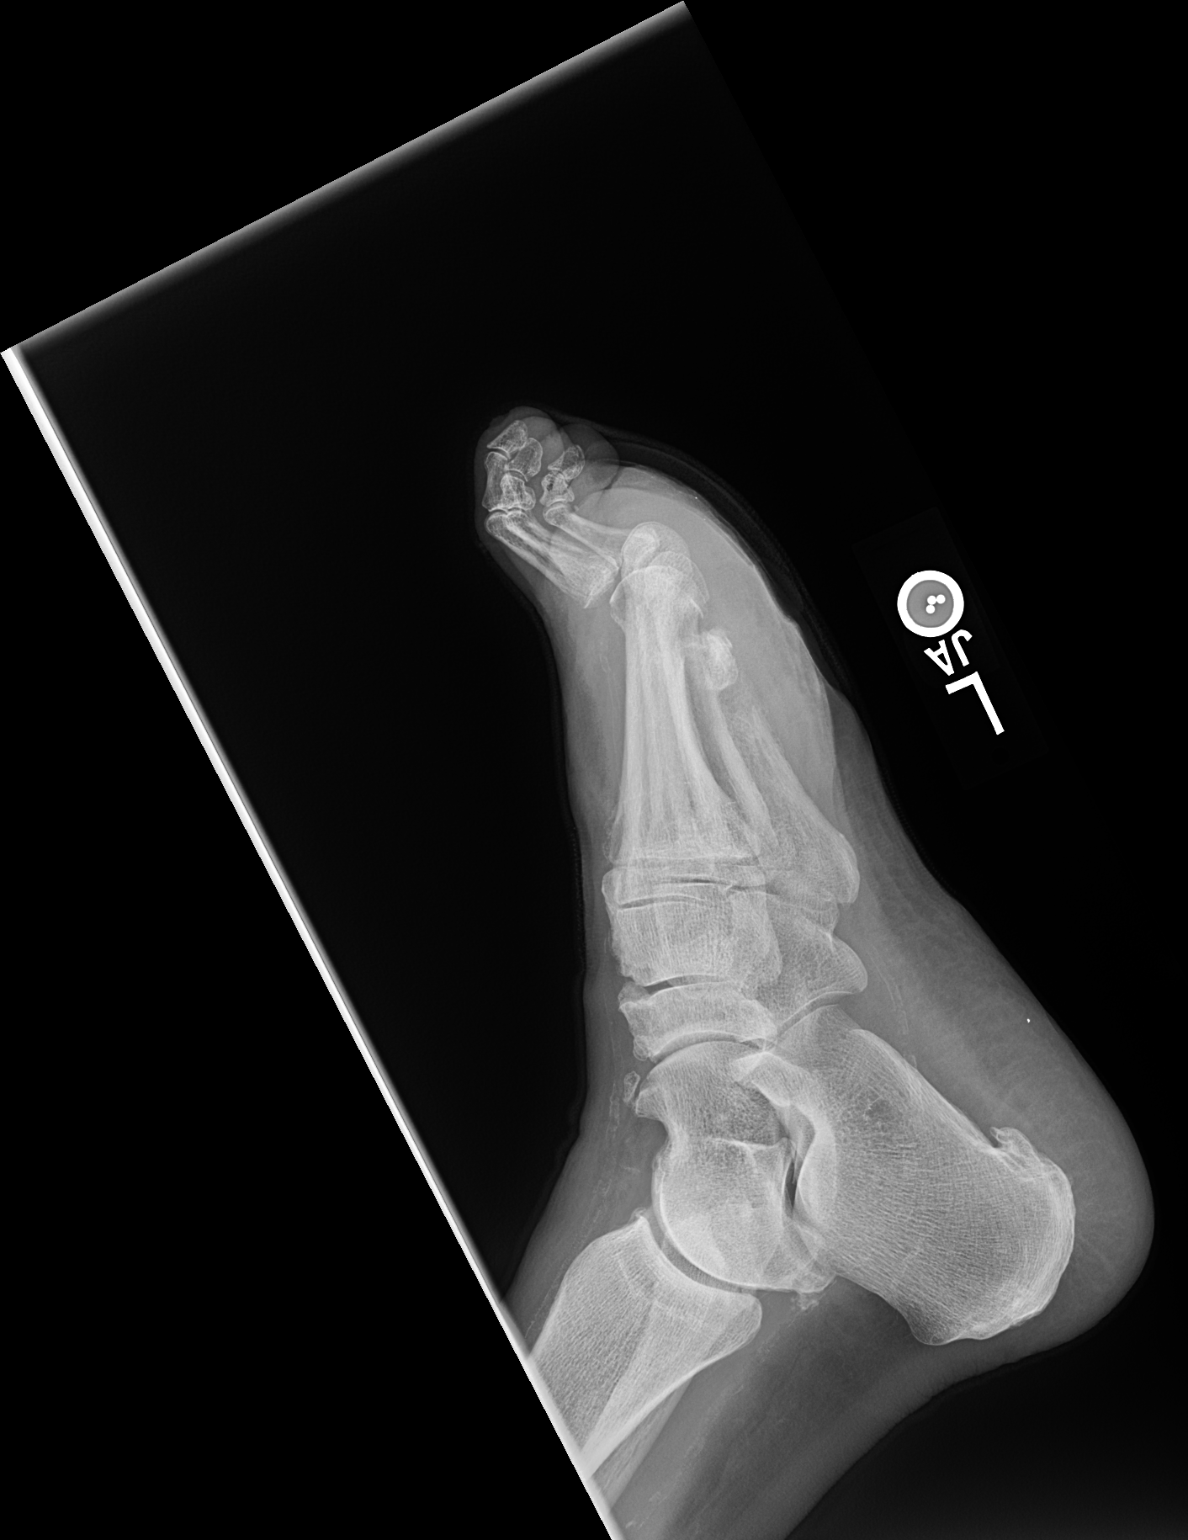

[3 of 3 positions shown; findings below may reference images not displayed]

FINDINGS: There is no acute fracture or dislocation.
There is amputation of the phalanges of the first digit and the fifth digit. Since last radiograph there has been progression of cortical changes at the first digit and the fifth digit suggesting osteomyelitis. The second third and fourth digits appear intact
No radiopaque foreign body or subcutaneous emphysema in the soft tissue.
_______________
IMPRESSION: First and fifth digit osteomyelitis. Recommend correlation with MRI of the foot.

## 2021-10-14 ENCOUNTER — Inpatient Hospital Stay: Admit: 2021-10-14 | Payer: MEDICARE

## 2021-10-14 ENCOUNTER — Emergency Department: Admit: 2021-10-14 | Payer: MEDICARE

## 2021-10-14 ENCOUNTER — Inpatient Hospital Stay: Admit: 2021-10-14 | Discharge: 2021-11-01 | Payer: MEDICARE | Attending: Foot & Ankle Surgery

## 2021-10-14 DIAGNOSIS — M869 Osteomyelitis, unspecified: Secondary | ICD-10-CM

## 2021-10-14 LAB — CBC WITH AUTO DIFFERENTIAL
BKR WAM ABSOLUTE IMMATURE GRANULOCYTES.: 0.05 x 1000/ÂµL (ref 0.00–0.30)
BKR WAM ABSOLUTE LYMPHOCYTE COUNT.: 1.45 x 1000/ÂµL (ref 0.60–3.70)
BKR WAM ABSOLUTE NRBC (2 DEC): 0 x 1000/ÂµL (ref 0.00–1.00)
BKR WAM ANALYZER ANC: 8.46 x 1000/ÂµL — ABNORMAL HIGH (ref 2.00–7.60)
BKR WAM BASOPHIL ABSOLUTE COUNT.: 0.04 x 1000/ÂµL (ref 0.00–1.00)
BKR WAM BASOPHILS: 0.4 % (ref 0.0–1.4)
BKR WAM EOSINOPHIL ABSOLUTE COUNT.: 0.18 x 1000/ÂµL (ref 0.00–1.00)
BKR WAM EOSINOPHILS: 1.6 % (ref 0.0–5.0)
BKR WAM HEMATOCRIT (2 DEC): 33.4 % — ABNORMAL LOW (ref 38.50–50.00)
BKR WAM HEMOGLOBIN: 10.7 g/dL — ABNORMAL LOW (ref 13.2–17.1)
BKR WAM IMMATURE GRANULOCYTES: 0.4 % (ref 0.0–1.0)
BKR WAM LYMPHOCYTES: 13 % — ABNORMAL LOW (ref 17.0–50.0)
BKR WAM MCH (PG): 25.4 pg — ABNORMAL LOW (ref 27.0–33.0)
BKR WAM MCHC: 32 g/dL (ref 31.0–36.0)
BKR WAM MCV: 79.1 fL — ABNORMAL LOW (ref 80.0–100.0)
BKR WAM MONOCYTE ABSOLUTE COUNT.: 0.97 x 1000/ÂµL (ref 0.00–1.00)
BKR WAM MONOCYTES: 8.7 % (ref 4.0–12.0)
BKR WAM MPV: 8.3 fL (ref 8.0–12.0)
BKR WAM NEUTROPHILS: 75.9 % — ABNORMAL HIGH (ref 39.0–72.0)
BKR WAM NUCLEATED RED BLOOD CELLS: 0 % (ref 0.0–1.0)
BKR WAM PLATELETS: 290 x1000/ÂµL (ref 150–420)
BKR WAM RDW-CV: 14.8 % (ref 11.0–15.0)
BKR WAM RED BLOOD CELL COUNT.: 4.22 M/ÂµL (ref 4.00–6.00)
BKR WAM WHITE BLOOD CELL COUNT: 11.2 x1000/ÂµL — ABNORMAL HIGH (ref 4.0–11.0)

## 2021-10-14 LAB — BASIC METABOLIC PANEL
BKR ANION GAP: 12 (ref 7–17)
BKR BLOOD UREA NITROGEN: 18 mg/dL (ref 8–23)
BKR BUN / CREAT RATIO: 16.4 (ref 8.0–23.0)
BKR CALCIUM: 10.2 mg/dL (ref 8.8–10.2)
BKR CHLORIDE: 106 mmol/L (ref 98–107)
BKR CO2: 25 mmol/L (ref 20–30)
BKR CREATININE: 1.1 mg/dL (ref 0.40–1.30)
BKR EGFR, CREATININE (CKD-EPI 2021): >60 mL/min/{1.73_m2} (ref >=60)
BKR GLUCOSE: 135 mg/dL — ABNORMAL HIGH (ref 70–100)
BKR POTASSIUM: 4.5 mmol/L (ref 3.3–5.3)
BKR SODIUM: 143 mmol/L (ref 136–144)

## 2021-10-14 LAB — SARS COV-2 (COVID-19) RNA: BKR SARS-COV-2 RNA (COVID-19) (YH): NEGATIVE

## 2021-10-14 LAB — C-REACTIVE PROTEIN     (CRP): BKR C-REACTIVE PROTEIN, HIGH SENSITIVITY: 182.5 mg/L — ABNORMAL HIGH

## 2021-10-14 LAB — PT/INR AND PTT (BH GH L LMW YH)
BKR INR: 1.06 (ref 0.89–1.15)
BKR PARTIAL THROMBOPLASTIN TIME: 30.4 s (ref 22.5–32.0)
BKR PROTHROMBIN TIME: 11.2 s (ref 9.5–12.1)

## 2021-10-14 MED ORDER — PIPERACILLIN-TAZOBACTAM (ZOSYN) 4.5GM MBP
Freq: Four times a day (QID) | INTRAVENOUS | Status: DC
Start: 2021-10-14 — End: 2021-10-15
  Administered 2021-10-15: 02:00:00 100.000 mL/h via INTRAVENOUS

## 2021-10-14 MED ORDER — DEXTROSE 15 GRAM/60 ML ORAL LIQUID
15 gram/60 mL | ORAL | Status: DC | PRN
Start: 2021-10-14 — End: 2021-10-23

## 2021-10-14 MED ORDER — PIPERACILLIN-TAZOBACTAM (ZOSYN) 4.5GM MBP
Freq: Four times a day (QID) | INTRAVENOUS | Status: DC
Start: 2021-10-14 — End: 2021-10-29
  Administered 2021-10-15 – 2021-10-29 (×56): 100.000 mL/h via INTRAVENOUS

## 2021-10-14 MED ORDER — DIPHENHYDRAMINE 25 MG CAPSULE
25 mg | Freq: Four times a day (QID) | ORAL | Status: DC | PRN
Start: 2021-10-14 — End: 2021-10-15

## 2021-10-14 MED ORDER — SODIUM CHLORIDE 0.9 % (FLUSH) INJECTION SYRINGE
0.9 % | Freq: Three times a day (TID) | INTRAVENOUS | Status: DC
Start: 2021-10-14 — End: 2021-11-02
  Administered 2021-10-15 – 2021-11-01 (×52): 0.9 mL via INTRAVENOUS

## 2021-10-14 MED ORDER — FRUIT JUICE
ORAL | Status: DC | PRN
Start: 2021-10-14 — End: 2021-10-23

## 2021-10-14 MED ORDER — VANCOMYCIN IVPB (1.25 G IN 250ML NS)
Freq: Two times a day (BID) | INTRAVENOUS | Status: DC
Start: 2021-10-14 — End: 2021-10-24
  Administered 2021-10-15 – 2021-10-23 (×18): 250.000 mL/h via INTRAVENOUS

## 2021-10-14 MED ORDER — ENOXAPARIN 30 MG/0.3 ML SUBCUTANEOUS SYRINGE
30 mg/0.3 mL | Freq: Two times a day (BID) | SUBCUTANEOUS | Status: DC
Start: 2021-10-14 — End: 2021-10-15
  Administered 2021-10-15: 02:00:00 30 mg/0.3 mL via SUBCUTANEOUS

## 2021-10-14 MED ORDER — VANCOMYCIN 1.5 G IN 500 ML IVPB (VIALMATE)
Freq: Once | INTRAVENOUS | Status: CP
Start: 2021-10-14 — End: ?
  Administered 2021-10-14: 500.000 mL/h via INTRAVENOUS

## 2021-10-14 MED ORDER — SODIUM CHLORIDE 0.9 % BOLUS (NEW BAG)
0.9 % | Freq: Once | INTRAVENOUS | Status: CP
Start: 2021-10-14 — End: ?
  Administered 2021-10-14: 21:00:00 0.9 mL/h via INTRAVENOUS

## 2021-10-14 MED ORDER — SKIM MILK
ORAL | Status: DC | PRN
Start: 2021-10-14 — End: 2021-10-23

## 2021-10-14 MED ORDER — VANCOMYCIN 1.5 G IN 500 ML IVPB (VIALMATE)
Freq: Two times a day (BID) | INTRAVENOUS | Status: DC
Start: 2021-10-14 — End: 2021-10-14

## 2021-10-14 MED ORDER — VANCOMYCIN 1.5 GRAM INTRAVENOUS SOLUTION
1.5 gram | Status: DC
Start: 2021-10-14 — End: 2021-11-02

## 2021-10-14 MED ORDER — POLYETHYLENE GLYCOL 3350 17 GRAM ORAL POWDER PACKET
17 gram | Freq: Every day | ORAL | Status: DC
Start: 2021-10-14 — End: 2021-10-21
  Administered 2021-10-16: 12:00:00 17 gram via ORAL

## 2021-10-14 MED ORDER — ACETAMINOPHEN 325 MG TABLET
325 mg | Freq: Four times a day (QID) | ORAL | Status: DC | PRN
Start: 2021-10-14 — End: 2021-11-02
  Administered 2021-10-26 – 2021-10-28 (×3): 325 mg via ORAL

## 2021-10-14 MED ORDER — OXYCODONE IMMEDIATE RELEASE 5 MG TABLET
5 mg | ORAL | Status: AC | PRN
Start: 2021-10-14 — End: ?

## 2021-10-14 MED ORDER — GLUCAGON 1 MG/ML IN STERILE WATER
Freq: Once | INTRAMUSCULAR | Status: DC | PRN
Start: 2021-10-14 — End: 2021-11-02

## 2021-10-14 MED ORDER — INSULIN LISPRO 100 UNIT/ML (SLIDING SCALE)
100 unit/mL | Freq: Three times a day (TID) | SUBCUTANEOUS | Status: DC
Start: 2021-10-14 — End: 2021-10-16
  Administered 2021-10-16: 17:00:00 100 unit/mL via SUBCUTANEOUS

## 2021-10-14 MED ORDER — LORAZEPAM 0.5 MG TABLET
0.5 mg | Freq: Once | ORAL | Status: DC
Start: 2021-10-14 — End: 2021-10-15

## 2021-10-14 MED ORDER — PIPERACILLIN-TAZOBACTAM (ZOSYN) 4.5GM MBP - ED FIRST DOSE
Freq: Once | INTRAVENOUS | Status: CP
Start: 2021-10-14 — End: ?
  Administered 2021-10-14: 21:00:00 100.000 mL/h via INTRAVENOUS

## 2021-10-14 MED ORDER — INSULIN LISPRO 100 UNIT/ML (SLIDING SCALE)
100 unit/mL | Freq: Every evening | SUBCUTANEOUS | Status: CP
Start: 2021-10-14 — End: ?
  Administered 2021-10-22: 01:00:00 100 unit/mL via SUBCUTANEOUS

## 2021-10-14 MED ORDER — ONDANSETRON 4 MG DISINTEGRATING TABLET
4 mg | Freq: Four times a day (QID) | ORAL | Status: DC | PRN
Start: 2021-10-14 — End: 2021-11-02

## 2021-10-14 MED ORDER — DEXTROSE 10 % IV BOLUS FOR ORDERABLE
INTRAVENOUS | Status: DC | PRN
Start: 2021-10-14 — End: 2021-10-23

## 2021-10-14 MED ORDER — OXYCODONE (ROXICODONE) IMMEDIATE RELEASE 2.5 MG HALFTAB
2.5 mg | ORAL | Status: AC | PRN
Start: 2021-10-14 — End: ?

## 2021-10-14 MED ORDER — NALOXONE 0.4 MG/ML INJECTION SOLUTION
0.4 mg/mL | INTRAVENOUS | Status: DC | PRN
Start: 2021-10-14 — End: 2021-11-02

## 2021-10-14 MED ORDER — SODIUM CHLORIDE 0.9 % (FLUSH) INJECTION SYRINGE
0.9 % | INTRAVENOUS | Status: DC | PRN
Start: 2021-10-14 — End: 2021-11-02
  Administered 2021-10-16: 12:00:00 0.9 mL via INTRAVENOUS

## 2021-10-14 NOTE — ED Notes
4:49 PM From home for foot ulcer on L foot. Reports fevers/chills and feeling generally unwell. Pt is not from this area and reports he is under the care of drs where he lives and they advised him after he sent photos to come here for care. Pt arrives aaox4, there is an open wound to bottom of foot and a closed blister like elation on top of the toes, crepitus noted on palpation exam to that area. Pt has hx of diabetes and diabetic ulcer, multiple surgerys for toe amputations. Plan for xray to r/o osteo, consult with podiatry and abx. 5:09 PMTo xray5:46 PMabx initiated.

## 2021-10-14 NOTE — ED Triage Note
Provider in Triage Note53 y.o. year old male  presents with diabetic foot wound- left foot. From [place], in [place] working. Physical Exam: awake, alert, nad, no resp distress, ambulatoryOrders placed in triage: yesDisposition: Main Emergency Department for further [facility], MD/MPHYale Emergency MedicineThis is a brief medical screening note and not intended to replace or supercede a full medical examination. 10/14/2021 3:59 PM

## 2021-10-14 NOTE — Utilization Review (ED)
UM Status: Meets Inpatient StatusXR foot with concerns for osteomyelitis, IV abx ordered.

## 2021-10-15 ENCOUNTER — Inpatient Hospital Stay: Admit: 2021-10-15 | Payer: MEDICARE | Attending: Student in an Organized Health Care Education/Training Program

## 2021-10-15 ENCOUNTER — Inpatient Hospital Stay: Admit: 2021-10-15 | Payer: MEDICARE

## 2021-10-15 ENCOUNTER — Encounter: Admit: 2021-10-15 | Payer: PRIVATE HEALTH INSURANCE | Attending: Anesthesiology

## 2021-10-15 DIAGNOSIS — M869 Osteomyelitis, unspecified: Secondary | ICD-10-CM

## 2021-10-15 LAB — BASIC METABOLIC PANEL
BKR ANION GAP: 12 (ref 7–17)
BKR ANION GAP: 12 (ref 7–17)
BKR BLOOD UREA NITROGEN: 19 mg/dL (ref 8–23)
BKR BLOOD UREA NITROGEN: 11 mg/dL (ref 8–23)
BKR BUN / CREAT RATIO: 19 (ref 8.0–23.0)
BKR BUN / CREAT RATIO: 13.8 (ref 8.0–23.0)
BKR CALCIUM: 9.4 mg/dL (ref 8.8–10.2)
BKR CALCIUM: 9.3 mg/dL (ref 8.8–10.2)
BKR CHLORIDE: 107 mmol/L (ref 98–107)
BKR CHLORIDE: 107 mmol/L (ref 98–107)
BKR CO2: 20 mmol/L (ref 20–30)
BKR CO2: 18 mmol/L — ABNORMAL LOW (ref 20–30)
BKR CREATININE: 1 mg/dL (ref 0.40–1.30)
BKR CREATININE: 0.8 mg/dL (ref 0.40–1.30)
BKR EGFR, CREATININE (CKD-EPI 2021): >60 mL/min/{1.73_m2} (ref >=60)
BKR EGFR, CREATININE (CKD-EPI 2021): >60 mL/min/{1.73_m2} (ref >=60)
BKR GLUCOSE: 130 mg/dL — ABNORMAL HIGH (ref 70–100)
BKR GLUCOSE: 106 mg/dL — ABNORMAL HIGH (ref 70–100)
BKR POTASSIUM: 4.4 mmol/L (ref 3.3–5.3)
BKR POTASSIUM: 4.4 mmol/L (ref 3.3–5.3)
BKR SODIUM: 139 mmol/L (ref 136–144)
BKR SODIUM: 137 mmol/L (ref 136–144)

## 2021-10-15 LAB — CBC WITH AUTO DIFFERENTIAL
BKR WAM ABSOLUTE IMMATURE GRANULOCYTES.: 0.03 x 1000/ÂµL (ref 0.00–0.30)
BKR WAM ABSOLUTE LYMPHOCYTE COUNT.: 1.24 x 1000/ÂµL (ref 0.60–3.70)
BKR WAM ABSOLUTE NRBC (2 DEC): 0 x 1000/ÂµL (ref 0.00–1.00)
BKR WAM ANALYZER ANC: 6.17 x 1000/ÂµL (ref 2.00–7.60)
BKR WAM BASOPHIL ABSOLUTE COUNT.: 0.04 x 1000/ÂµL (ref 0.00–1.00)
BKR WAM BASOPHILS: 0.5 % (ref 0.0–1.4)
BKR WAM EOSINOPHIL ABSOLUTE COUNT.: 0.24 x 1000/ÂµL (ref 0.00–1.00)
BKR WAM EOSINOPHILS: 2.9 % (ref 0.0–5.0)
BKR WAM HEMATOCRIT (2 DEC): 35.4 % — ABNORMAL LOW (ref 38.50–50.00)
BKR WAM HEMOGLOBIN: 11.1 g/dL — ABNORMAL LOW (ref 13.2–17.1)
BKR WAM IMMATURE GRANULOCYTES: 0.4 % (ref 0.0–1.0)
BKR WAM LYMPHOCYTES: 14.9 % — ABNORMAL LOW (ref 17.0–50.0)
BKR WAM MCH (PG): 24.7 pg — ABNORMAL LOW (ref 27.0–33.0)
BKR WAM MCHC: 31.4 g/dL (ref 31.0–36.0)
BKR WAM MCV: 78.8 fL — ABNORMAL LOW (ref 80.0–100.0)
BKR WAM MONOCYTE ABSOLUTE COUNT.: 0.59 x 1000/ÂµL (ref 0.00–1.00)
BKR WAM MONOCYTES: 7.1 % (ref 4.0–12.0)
BKR WAM MPV: 8.2 fL (ref 8.0–12.0)
BKR WAM NEUTROPHILS: 74.2 % — ABNORMAL HIGH (ref 39.0–72.0)
BKR WAM NUCLEATED RED BLOOD CELLS: 0 % (ref 0.0–1.0)
BKR WAM PLATELETS: 296 x1000/ÂµL (ref 150–420)
BKR WAM RDW-CV: 14.7 % (ref 11.0–15.0)
BKR WAM RED BLOOD CELL COUNT.: 4.49 M/ÂµL (ref 4.00–6.00)
BKR WAM WHITE BLOOD CELL COUNT: 8.3 x1000/ÂµL (ref 4.0–11.0)

## 2021-10-15 LAB — LIPID PANEL
BKR CHOLESTEROL/HDL RATIO: 5 (ref 0.0–5.0)
BKR CHOLESTEROL: 130 mg/dL
BKR HDL CHOLESTEROL: 26 mg/dL — ABNORMAL LOW (ref >=40)
BKR LDL CHOLESTEROL SAMPSON CALCULATED: 85 mg/dL
BKR TRIGLYCERIDES: 101 mg/dL

## 2021-10-15 LAB — C-REACTIVE PROTEIN     (CRP): BKR C-REACTIVE PROTEIN, HIGH SENSITIVITY: 169.8 mg/L — ABNORMAL HIGH

## 2021-10-15 LAB — SEDIMENTATION RATE (ESR)
BKR SEDIMENTATION RATE, ERYTHROCYTE: 49 mm/h — ABNORMAL HIGH (ref 0–20)
BKR SEDIMENTATION RATE, ERYTHROCYTE: 66 mm/h — ABNORMAL HIGH (ref 0–20)

## 2021-10-15 LAB — ZZZMRSA BY PCR- VANCO RX (PHARMACY USE ONLY) (YH): BKR MRSA COLONIZATION STATUS PCR: NOT DETECTED

## 2021-10-15 MED ORDER — PHENYLEPHRINE 1 MG/10 ML (100 MCG/ML) IN 0.9 % SOD.CHLORIDE IV SYRINGE
110100 mg/0 mL (00 mcg/mL) | INTRAVENOUS | Status: DC | PRN
Start: 2021-10-15 — End: 2021-10-15
  Administered 2021-10-15 (×6): 1 mg/0 mL (00 mcg/mL) via INTRAVENOUS

## 2021-10-15 MED ORDER — BUPIVACAINE (PF) 0.5 % (5 MG/ML) INJECTION SOLUTION
0.55 % (5 mg/mL) | Status: DC | PRN
Start: 2021-10-15 — End: 2021-10-15
  Administered 2021-10-15: 22:00:00 0.5 % (5 mg/mL)

## 2021-10-15 MED ORDER — GADOTERATE MEGLUMINE 0.5 MMOL/ML (376.9 MG/ML) INTRAVENOUS SOLUTION
0.5 mmol/mL (376.9 mg/mL) | Freq: Once | INTRAVENOUS | Status: CP | PRN
Start: 2021-10-15 — End: ?
  Administered 2021-10-15: 10:00:00 0.5 mL via INTRAVENOUS

## 2021-10-15 MED ORDER — PROPOFOL 10 MG/ML INTRAVENOUS EMULSION
10 mg/mL | INTRAVENOUS | Status: DC | PRN
Start: 2021-10-15 — End: 2021-10-15
  Administered 2021-10-15: 22:00:00 10 mg/mL via INTRAVENOUS

## 2021-10-15 MED ORDER — LACTATED RINGERS INTRAVENOUS SOLUTION
INTRAVENOUS | Status: DC | PRN
Start: 2021-10-15 — End: 2021-10-15
  Administered 2021-10-15: 22:00:00 via INTRAVENOUS

## 2021-10-15 MED ORDER — EPHEDRINE 25 MG/5 ML IN 0.9% SODIUM CHLORIDE (WRAPPED ERX)
25 mg/5 mL (5 mg/mL) | Status: CP
Start: 2021-10-15 — End: ?

## 2021-10-15 MED ORDER — EPHEDRINE SULFATE 5 MG/ML INTRAVENOUS SOLUTION
5 mg/mL | INTRAVENOUS | Status: DC | PRN
Start: 2021-10-15 — End: 2021-10-15
  Administered 2021-10-15 (×2): 5 mg/mL via INTRAVENOUS

## 2021-10-15 MED ORDER — DIPHENHYDRAMINE 25 MG CAPSULE
25 mg | Freq: Once | ORAL | Status: CP | PRN
Start: 2021-10-15 — End: ?
  Administered 2021-10-15: 05:00:00 25 mg via ORAL

## 2021-10-15 MED ORDER — ONDANSETRON HCL (PF) 4 MG/2 ML INJECTION SOLUTION
42 mg/2 mL | INTRAVENOUS | Status: DC | PRN
Start: 2021-10-15 — End: 2021-10-15
  Administered 2021-10-15: 22:00:00 4 mg/2 mL via INTRAVENOUS

## 2021-10-15 MED ORDER — GABAPENTIN 300 MG CAPSULE
300 mg | Freq: Three times a day (TID) | ORAL | Status: DC
Start: 2021-10-15 — End: 2021-11-02
  Administered 2021-10-15 – 2021-11-01 (×52): 300 mg via ORAL

## 2021-10-15 MED ORDER — HYDROMORPHONE 0.5 MG/0.5 ML INJECTION SYRINGE
0.50.5 mg/ mL | INTRAVENOUS | Status: DC | PRN
Start: 2021-10-15 — End: 2021-10-15

## 2021-10-15 MED ORDER — EMOLLIENT COMBINATION NO.92 TOPICAL LOTION
Freq: Every day | TOPICAL | Status: DC | PRN
Start: 2021-10-15 — End: 2021-11-02
  Administered 2021-10-15 – 2021-10-19 (×4): 177.000 mL via TOPICAL

## 2021-10-15 MED ORDER — FENTANYL (PF) 50 MCG/ML INJECTION SOLUTION
50 mcg/mL | INTRAVENOUS | Status: DC | PRN
Start: 2021-10-15 — End: 2021-10-15
  Administered 2021-10-15: 22:00:00 50 mcg/mL via INTRAVENOUS

## 2021-10-15 MED ORDER — BUPIVACAINE (PF) 0.5 % (5 MG/ML) INJECTION SOLUTION
0.5 % (5 mg/mL) | Status: DC
Start: 2021-10-15 — End: 2021-11-02

## 2021-10-15 MED ORDER — ESCITALOPRAM 10 MG TABLET
10 mg | Freq: Every day | ORAL | Status: CP
Start: 2021-10-15 — End: ?
  Administered 2021-10-15 – 2021-10-24 (×10): 10 mg via ORAL

## 2021-10-15 MED ORDER — FENTANYL (PF) 50 MCG/ML INJECTION SOLUTION
50 mcg/mL | INTRAVENOUS | Status: DC | PRN
Start: 2021-10-15 — End: 2021-10-15

## 2021-10-15 MED ORDER — INSULIN U-100 REGULAR HUMAN 100 UNIT/ML (SLIDING SCALE)
100 unit/mL | Freq: Four times a day (QID) | SUBCUTANEOUS | Status: DC
Start: 2021-10-15 — End: 2021-10-15

## 2021-10-15 MED ORDER — ACETAMINOPHEN 1,000 MG/100 ML (10 MG/ML) INTRAVENOUS SOLUTION
10 mg/mL | INTRAVENOUS | Status: DC | PRN
Start: 2021-10-15 — End: 2021-10-15
  Administered 2021-10-15: 23:00:00 10 mg/mL via INTRAVENOUS

## 2021-10-15 MED ORDER — ASPIRIN 81 MG CHEWABLE TABLET
81 mg | Freq: Every day | ORAL | Status: DC
Start: 2021-10-15 — End: 2021-11-02
  Administered 2021-10-16 – 2021-11-01 (×16): 81 mg via ORAL

## 2021-10-15 MED ORDER — MIDAZOLAM 1 MG/ML INJECTION SOLUTION
1 mg/mL | Status: CP
Start: 2021-10-15 — End: ?

## 2021-10-15 MED ORDER — PROPOFOL 10 MG/ML INTRAVENOUS EMULSION
10 mg/mL | INTRAVENOUS | Status: DC | PRN
Start: 2021-10-15 — End: 2021-10-15
  Administered 2021-10-15: 22:00:00 10 mL/h via INTRAVENOUS

## 2021-10-15 MED ORDER — MIDAZOLAM 1 MG/ML INJECTION SOLUTION
1 mg/mL | INTRAVENOUS | Status: DC | PRN
Start: 2021-10-15 — End: 2021-10-15
  Administered 2021-10-15: 22:00:00 1 mg/mL via INTRAVENOUS

## 2021-10-15 MED ORDER — METOPROLOL TARTRATE IMMEDIATE RELEASE 25 MG TABLET
25 mg | Freq: Two times a day (BID) | ORAL | Status: CP
Start: 2021-10-15 — End: ?
  Administered 2021-10-15 – 2021-10-31 (×32): 25 mg via ORAL

## 2021-10-15 MED ORDER — SODIUM CHLORIDE 0.9 % IRRIGATION SOLUTION
0.9 % irrigation | Status: CP | PRN
Start: 2021-10-15 — End: ?
  Administered 2021-10-15 (×2): 0.9 % irrigation

## 2021-10-15 MED ORDER — PHENYLEPHRINE 1 MG/10 ML (100 MCG/ML) IN 0.9 % SOD.CHLORIDE IV SYRINGE
1 mg/0 mL (00 mcg/mL) | Status: CP
Start: 2021-10-15 — End: ?

## 2021-10-15 MED ORDER — DIMENHYDRINATE 5 MG/ML IN 0.9% SODIUM CHLORIDE
INTRAVENOUS | Status: DC | PRN
Start: 2021-10-15 — End: 2021-10-15

## 2021-10-15 MED ORDER — FENTANYL (PF) 50 MCG/ML INJECTION SOLUTION
50 mcg/mL | Status: CP
Start: 2021-10-15 — End: ?

## 2021-10-15 MED ORDER — SODIUM HYPOCHLORITE 0.125 % SOLUTION
0.125 % | Freq: Every day | TOPICAL | Status: DC
Start: 2021-10-15 — End: 2021-11-02
  Administered 2021-10-16 – 2021-11-01 (×16): 0.125 mL via TOPICAL

## 2021-10-15 MED ORDER — ENOXAPARIN 40 MG/0.4 ML SUBCUTANEOUS SYRINGE
40 mg/0.4 mL | SUBCUTANEOUS | Status: DC
Start: 2021-10-15 — End: 2021-10-16

## 2021-10-15 MED ORDER — PIPERACILLIN-TAZOBACTAM 4.5 GRAM INTRAVENOUS SOLUTION
4.5 gram | Status: CP
Start: 2021-10-15 — End: ?
  Administered 2021-10-17: 04:00:00 4.5 gram

## 2021-10-15 MED ORDER — ONDANSETRON HCL (PF) 4 MG/2 ML INJECTION SOLUTION
42 mg/2 mL | INTRAVENOUS | Status: DC | PRN
Start: 2021-10-15 — End: 2021-10-15

## 2021-10-15 NOTE — H&P
[facility]	Podiatry Admission H&PDate: 4/24/2023Patient Name: Edwin [name]: [id number] Attending Provider: Roma Schanz, MDCC: Left foot woundSubjective: HPI: Edwin [name] is a 71 y.o. male with PMHx of DM c/b neuropathy, multiple podiatric amputations, remaining PMH unknown, pt is from [place] and working in [place] temporarily.  Patient reports that he noticed left foot wound this week and has since worsened.  Patient states that he he has almost no feeling in his feet bilateral.  Patient unsure about progression of wound or drainage coming from wound.  Patient has not had any recent antibiotics. Denies n/v/f/c/sob/cp.PMH: PSH: No past medical history on file. No past surgical history on file. Social History: Family History: Social History Tobacco Use ? Smoking status: Not on file ? Smokeless tobacco: Not on file Substance Use Topics ? Alcohol use: Not on file  No family history on file. Allergies: Patient has no known allergies.Review of Systems: Constitutional: Negative for chills and fever. HENT: Negative for hearing loss and tinnitus.  Eyes: Negative for blurred vision and double vision. Respiratory: Negative for cough and shortness of breath.  Cardiovascular: Negative for chest pain, palpitations and leg swelling. Gastrointestinal: Negative for heartburn and nausea. Genitourinary: Negative for dysuria and urgency. Musculoskeletal: Negative for myalgias. Skin: Negative for itching and rash. Neurological: Negative for dizziness and headaches. Psychiatric/Behavioral: Negative for depression and suicidal ideas. Vitals: Vitals:  10/14/21 1602 BP: 136/82 Pulse: 82 Resp: 18 Temp: 98 ?F (36.7 ?C) Physical Exam: Gen: AAOx3, NADChest: CTAB, no r/r/wCardio: RRR, S1, S2, no m/r/gAbd: Soft NT, ND, +bsVascular: DP/PT pulses non-palpable. Delayed CRT to all digits left foot. Foot is hot to touch. Pedal hair growth is absent. Neuro: Sensation is absent.MSK: Left foot first and fifth digit/partial ray amputations.  Right 2-5 partial ray resections. No TTP. All digits left foot dactylitic Derm: Left foot: Full thickness plantar wound sub 2,3 met heads that tunnels to dorsal skin.  Wound PTB to multiple metatarsals.  Wound with necrotic tissue present in wound bed, seropurulent drainage present in wound.  HPK tissue surrounding wound.  Left foot with non-blanchable erythema and edema noted.  No fluctuance, crepitus, proximal streaking.  Atrophic skin changes to dorsal foot at the level of the 2nd and 3rd metatarsophalangeal joint, skin with purplish discoloration.  Skin is taut. Right foot:  Full-thickness wound to the plantar aspect of the 3rd metatarsal amputation site.  Negative probe to bone.  Wound bed is fibrotic.  No purulent drainage, fluctuance, proximal streaking, erythema, edema.Labs: Lab Results Component Value Date  WBC 11.2 (H) 10/14/2021  HGB 10.7 (L) 10/14/2021  HCT 33.40 (L) 10/14/2021  CREATININE 1.10 10/14/2021  BUN 18 10/14/2021  CO2 25 10/14/2021  PLT 290 10/14/2021  INR 1.06 10/14/2021  GLU 135 (H) 10/14/2021  K 4.5 10/14/2021  NA 143 10/14/2021  CL 106 10/14/2021  HSCRP 182.5 (H) 10/14/2021 Imaging: XR L foot: 10/14/2021 IMPRESSION: Postsurgical changes with findings concerning for osteomyelitis of the first metatarsal head, hallux sesamoid bone and possibly at the resection margin of the fifth metatarsal is well. Further evaluation with MRI should be considered.Micro/Path: Micro: 10/14/2021 - pending1. DWCx Left foot: Assessment/Plan: 71 y.o. male with past medical history of diabetes presenting with bilateral foot wounds.  Left foot wound with acute signs of infection.- Admit to podiatry under Dr. Matthias Hughs- Labs and imaging reviewed:	-Afebrile and VSS	-WBC 11.2 CRP 183	-XR c/f OM - Verbal consent obtained, bedside incision and drainage performed of left foot wound under sterile technique, wounds copiously lavaged with sterile saline, left foot dressed with Betadine,  4 x 4 gauze, Kerlix roll.- Right foot dressed with Betadine DSD- Podiatry will perform dressing changes as indicated- Nursing please reinforce dressing PRN with ABDs, 4x4 gauze, kerlix, ACE- Insulin regimen started- Pre-op chest XR and EKG ordered- Left foot MRI w and wo contrast ordered- Right foot XR orderedAbx- vanc/zosynConsult- Medicine: for medical comanagement and pre-op risk stratificationDiet: CCActivity: NWB to LLEDVT Ppx: lovenoxCode: FullDispo: Pending advanced imaging, improvement on IV Abx, perfusion assessmentDiscussed with attending Dr. Matthias Hughs Please page on-call podiatry resident with questions:?	SEE AMION FOR CALL SCHEDULE Electronically Signed by Heidi Dach, DPM, October 14, 2021 Discussed with resident, agree with plan. Patient will need urgent MRI, NPO after midnight given X-ray shows signs of local gas but likely mostly within the wound itself. Our team will decompress any gas bedside in the meantime.Electronically Signed by Paulene Floor, DPM, April 24, 2023Addendum:Marked left foot surrounding erythema >2cm. Sluggish CFT ~4s to left digits. Obtained informed consent from patient for bedside I&D- XR and labs reviewed- Verbal consent obtained; Site cleansed with betadine, Bedside I&D performed using 15 blade and laceration kit with ~5cc of purulence. Purulence was cultured (Dorsal wound culture). all non-viable tissue removed from wound site, wound copious lavaged with 50cc of NS, patient tolerated procedure well- Redressed wound with betadine DSD- Podiatric surgery will perform dressing changes as indicated- Nursing please reinforce PRN with 4x4 gauze, ABD pads, kerlix.- c/w V/Z- NWB to LEFT foot- ordered NIVS- pending dorsal and plantar wound cultures- Will consider PV consult in the AM- Patient consented to go to the OR tomorrow for Left foot excisional debridement of all nonviable soft tissue and bone including incision and drainage- NPO at midnight and pre-op accordinglyAttending: Dr. Leland Her, DPM, PhDPodiatric Surgery, PGY-1Discussed with resident, agree with plan. Electronically Signed by Paulene Floor, DPM, April 24, 2023I saw and evaluated Edwin [name] with the resident. I agree with the findings and the plan of care as documented in the resident note. MRIs reviewed, concerning for left foot diffuse infection with septic arthritis of 2nd and 3rd MPJs, first metatarsal osteomyelitis with gas, and fifth metatarsal osteomyelitis. I discussed with the patient that the most reasonable and effective approach to providing him with infection control but a functional amputation is a transmetatarsal amputation. The patient is amenable to this and states that he prefers a TMA as he is concerned about proximal spreading of the infection. Patient has been NPO and written informed consent has been obtained. Risks and benefits of podiatric surgical intervention discussed with patient in detail. Risks include but are not limited to infection, pain, swelling, scarring, nonhealing, deformity, altered/impaired gait, numbness, need for further surgical intervention, deep vein thrombosis, pulmonary embolism, amputation, loss of limb, loss of life. Patient demonstrated awareness of these risks and is amenable to procedure. No guarantees were given or implied.I informed him that his amputation will be open and he will require a staged procedure this admission with likely delayed primary closure and tendo achilles lengthening, pending results of his intraoperative pathology and cultures.Electronically Signed by Paulene Floor, DPM, 10/15/2021

## 2021-10-15 NOTE — Other
Operative Diagnosis:Pre-op:   Abscess of left foot [L02.612] Patient Coded Diagnosis   Pre-op diagnosis: Abscess of left foot  Post-op diagnosis: Abscess of left foot  Patient Diagnosis   Pre-op diagnosis: Abscess of left foot [L02.612]  Post-op diagnosis:     Post-op diagnosis:   * Abscess of left foot [L02.612]Operative Procedure(s) :Procedure(s) (LRB):LEFT FOOT TRANSMETATARSAL AMPUTATION (Left)Post-op Procedure & Diagnosis ConfirmationPost-op Diagnosis: Post-op Diagnosis confirmed (no changes)Post-op Procedure: Post-op Procedure confirmed (no changes)

## 2021-10-15 NOTE — Brief Op Note
[facility] HealthPatient Name: Edwin [name]        [id number] Patient DOB: Apr 24, 1951     Surgery Date: 4/25/2023Surgeon(s) and Role:   * Paulene Floor, DPM - PrimaryAssistant(s):Resident: Heidi Dach., DPMStaff:  Circulator: Ophelia Charter, RNRelief Circulator: Marcello Fennel, RNRelief Scrub: Dorna Bloom Person: Andrey Campanile, LorenzoPre-Op Diagnosis: Abscess of left foot [L02.612] Procedure(s) and Anesthesia Type:   * LEFT FOOT TRANSMETATARSAL AMPUTATION - MONITORED ANESTHESIA CAREOperative Findings (enter relevant operative findings; do not refer to an operative report that is not yet transcribed): Left foot with full thickness wounds dorsal and plantar with liquefactive necrosis present in wounds..2cc purulence expressed from dissection or upon incision. Bone stock distal to amputation site softBone stock proximal to amputation site hard. Bleeding intraoperatively was acceptable. Upon closure, flaps noted to have capillary refill time within normal limits.Signs of infection present at the time of surgery at the operative site: Cellulitis, Infected tissue necrosis and Deep abscess Blood and Blood Products: none                 Drains:  noneImplants: * No implants in log * Specimens: ID Type Source Tests Collected by Time 1 : left 2nd toe bone Tissue Foot, Left PATHOLOGY [facility]) Paulene Floor, DPM 10/15/2021  5:40 PM 2 : left 3rd toe bone Tissue Foot, Left PATHOLOGY [facility]) Paulene Floor, DPM 10/15/2021  5:44 PM 3 : left foot tissue Tissue Foot, Left PATHOLOGY [facility]) Paulene Floor, DPM 10/15/2021  5:44 PM 4 : left 1st metatarsal clean margin Tissue Foot, Left PATHOLOGY [facility]) Paulene Floor, DPM 10/15/2021  5:44 PM 5 : left 2nd metatarsal clean margin Tissue Foot, Left PATHOLOGY [facility]) Paulene Floor, DPM 10/15/2021  5:44 PM 6 : left 3rd metatarsal clean margin Tissue Foot, Left PATHOLOGY [facility]) Paulene Floor, DPM 10/15/2021  5:45 PM 7 : left 4th metatarsal clean margin Tissue Foot, Left PATHOLOGY [facility]) Paulene Floor, DPM 10/15/2021  5:45 PM 8 : left 5th metatarsal clean margin Tissue Foot, Left PATHOLOGY [facility]) Paulene Floor, DPM 10/15/2021  5:45 PM 9 : Left TMA Gross Only Foot, Left PATHOLOGY ([facility]) Paulene Floor, DPM 10/15/2021  5:47 PM  Intra-op Labs (16h ago, onward)   Start     Ordered  10/15/21 1806  Deep wound culture - Includes Gram Stain and Anaerobic Culture  [[id number]]  Once      Question Answer Comment Specimen site/method Foot LT  Specimen Site Details (26 character limit): left 5th metatarsal clean margin  Lab Specific Advisory Note: DO NOT PLACE ORDERS IN COMMENT FIELD  Release to patient Immediate    10/15/21 1805  10/15/21 1804  Deep wound culture - Includes Gram Stain and Anaerobic Culture  [[id number]]  Once      Question Answer Comment Specimen site/method Foot LT  Specimen Site Details (26 character limit): left 4th metatarsal clean margin  Lab Specific Advisory Note: DO NOT PLACE ORDERS IN COMMENT FIELD  Release to patient Immediate    10/15/21 1804  10/15/21 1803  Deep wound culture - Includes Gram Stain and Anaerobic Culture  [[id number]]  Once      Question Answer Comment Specimen site/method Foot LT  Specimen Site Details (26 character limit): left 3rd metatarsal clean margin  Lab Specific Advisory Note: DO NOT PLACE ORDERS IN COMMENT FIELD  Release to patient Immediate    10/15/21 1802  10/15/21 1800  Deep wound culture - Includes Gram Stain and Anaerobic Culture  [[id number]]  Once      Question Answer Comment Specimen site/method Foot LT  Specimen Site Details (26 character limit): left 2nd metatarsal clean margin  Lab Specific Advisory Note: DO NOT PLACE ORDERS IN COMMENT FIELD  Release to patient Immediate    10/15/21 1800  10/15/21 1759 Deep wound culture - Includes Gram Stain and Anaerobic Culture  [[id number]]  Once      Question Answer Comment Specimen site/method Foot LT  Specimen Site Details (26 character limit): left 1st metatarsal clean margin  Lab Specific Advisory Note: DO NOT PLACE ORDERS IN COMMENT FIELD  Release to patient Immediate    10/15/21 1759  10/15/21 1758  Deep wound culture - Includes Gram Stain and Anaerobic Culture  [[id number]]  Once      Question Answer Comment Specimen site/method Foot LT  Specimen Site Details (26 character limit): left 3rd toe bone  Lab Specific Advisory Note: DO NOT PLACE ORDERS IN COMMENT FIELD  Release to patient Immediate    10/15/21 1758  10/15/21 1757  Deep wound culture - Includes Gram Stain and Anaerobic Culture  [[id number]]  Once      Question Answer Comment Specimen site/method Foot LT  Specimen Site Details (26 character limit): left 2nd toe bone  Lab Specific Advisory Note: DO NOT PLACE ORDERS IN COMMENT FIELD  Release to patient Immediate    10/15/21 1756  10/15/21 1755  Deep wound culture - Includes Gram Stain and Anaerobic Culture  [[id number]]  Once      Question Answer Comment Specimen site/method Foot LT  Specimen Site Details (26 character limit): left foot tissue  Lab Specific Advisory Note: DO NOT PLACE ORDERS IN COMMENT FIELD  Release to patient Immediate    10/15/21 1755    EBL: 150 mL      Post Operative Diagnosis: same Hasan Douse M Drelyn Pistilli, DPM4/25/20237:05 PM

## 2021-10-15 NOTE — Plan of Care
Problem: Adult Inpatient Plan of CareGoal: Readiness for Transition of CareOutcome: Interventions implemented as appropriate Plan of Care Overview/ Patient Status    Met/spoke with patient, introduced myself and reviewed Care Management role. Patient verbalized understanding.PMHx:    	 DMP/W:                          Bilateral foot wounds, left foot with s/s of acute infection.    FROM:             Home in [place] with spouse.  Pt and spouse are in [place] for pt's work.  Indep at baseline, drives.    PLAN:            DC needs not yet identified.  CM cont to follow. Case Management Screening and Evaluation  Flowsheet Row Most Recent Value Case Management Screening: Chart review completed. If YES to any question below then proceed to CM Eval/Plan  Is there a change in their cognitive function No Do you anticipate a change in this patient's physicial function that will effect discharge needs? Yes Has there been a readmission within the last 30 days No Were there services prior to admission ( Examples: Assisted Living, HD, Homecare, Extended Care Facility, Methadone, SNF, Outpatient Infusion Center) No Negative/Positive Screen Positive Screening: Complete CM Evaluation and Plan Case Manager Attestation  I have reviewed the medical record and completed the above screen. CM staff will follow patient's progress and discuss the plan of care with the Treatment Team. Yes Case Management Evaluation and Plan  Arrived from prior to admission home/apartment/condo Do you have a caregiver? No  Lives With Spouse Services Prior to Admission none Patient Requires Care Coordination Intervention Due To discharge planning needs/concerns Prior to Hospitalization: Assistance Needed/DME being used None Documented Insurance Accurate Yes Any financial concerns related to anticipated discharge needs No Patient's home address verified Yes Patient's PCP of record verified Yes Last Date Seen by PCP 3-6 months Source of Clinical History  Patient's clinical history has been reviewed and source of Information is: Patient, Medical Provider, Medical record Case Manager Attestation  I have reviewed the medical record and completed the above evaluation with the following recommendations. Yes Discharge Planning Coordination Recommendations  Discharge Planning Coordination Recommendations Needs not determined at this time  Dwyne Hasegawa B. Christin Fudge, RN, Mining engineer, SLA-2 & V2-NMHB/Cell: [phone]

## 2021-10-15 NOTE — ED Notes
7:16 PM report received, pending admission bed. Here for diabetic ulcers bilaterally, pending MRI imaging/antibiotics. 7:58 PM vanco infusing, MRI screening completed. Pending admission bed. Floor Handoff Telemetry: 	[]  Yes		[x]  NoCode Status:   [x]  Full		[]  DNR		[]  DNI		Other (specify):Safety Precautions: [x]  Fall Risk  []  Sitter   []  Restraints	[]  Suicidal	[]  None	Other (specify):Mentation/Orientation:	 A&O (Self, person, place, time) x     4     	 Disoriented to:                    	 Special Accommodations: []  Hearing impaired   []  Blind  []  Nonverbal  []  Cognitive impairmentOxygenation Upon Admission: []  RA	[]  NC	[]  Venti  []  Simple Mask []  Other	Baseline O2 Status? []  Yes	[]  NoAmbulation: [x]  Independent	[]  Cane   []  Walker	[]  Wheelchair	[]  Bedbound		[]  Hemiplegic	[]  Paraplegic	[]  QuadraplegicEliminiation: [x]  Independent	[]  Commode	[]  Bedpan/Urinal  []  Straight Cath []  Foley cath			[]  Urostomy	[]  Colostomy	Other (specify):Diarrhea/Loose stool : []  1x within 24h  []  2x within 24h  []  3x within 24h  [x]  None 	C.Diff Order: 	[]  Ordered- needs to be collected             []  Collected-sent to lab             []  Resulted - Negative C.Diff             []  Resulted - Positive C.Diff[]  Not Ordered   []  N/ASkin Alteration: []  Pressure Injury [x]  Wound []  None []  Skin not assessedDiet: [x]  Regular/No order placed	[]  NPO		Other (specify):IV Access: [x]  PIV   []  PICC    []  Port    [] Central line    []  A-line    Other (specify)IVF/GTT Running Upon ED Departure? [x]  No	    []  Yes (specify):Outstanding Meds/Treatments/Tests:Patient Belongings:Are the belongings documented?          []  No	    []  YesIs someone taking belongings home?   []  No     []  Yes  Who? (specify)                                   ED RN and Contact number/MHB #:   Gatha Mayer RN

## 2021-10-15 NOTE — Other
PACU to Floor Nursing Transfer NotePreop Diagnosis: Abscess of left foot [L02.612]Procedure Done: *  LEFT FOOT INCISION & DRAINAGE BELOW FASCIA, W/WO TENDON SHEATH INVOLVEMENT, FOOT; SINGLE BURSAL SPACE (Left). *Nerve Block: N/AAny Significant Events Intra-Op: noneAbnormal Assessment in PACU: noneLevel of Consciousness: awake, alert  and orientedLast Set of VS:  Vitals:  10/15/21 1628 10/15/21 1905 10/15/21 1906 10/15/21 1915 BP: 130/73 96/67 96/67  117/70 Pulse: 74 74 80 79 Resp: 19 (!) 13 16 (!) 13 Temp: 98.7 ?F (37.1 ?C) 97.4 ?F (36.3 ?C) 97.4 ?F (36.3 ?C)  TempSrc: Oral Temporal Temporal  SpO2: 97% 100% 97% 98% Weight:     Height:     Device (Oxygen Therapy): room air  Baseline Neuro/developmental Status: WDLLabs Collected: NoSpecial Needs of the Patient: noneAntibiotics: next dose due at: per [facility] vanco given last @ 1532. Zosyn given last @ 1749. IV Access: Periph IV 10/15/21 0240 dorsum left arm over-the-needle catheter system 20 gauge;1 in length (Active) SiteCare/Dressing Status/Securement dressing dry and intact 10/15/21 1907 Date Dressing Applied/Changed 10/15/21 10/15/21 0240 Next Date Dressing Change 10/22/2021 10/15/21 0240 Lumen 1 Patency/Care flushed w/o difficulty;Patent 10/15/21 1907 Site Signs asymptomatic without redness, warmth, swelling, pain, palpable cord, streak formation, drainage, dressing intact 10/15/21 1907 Phlebitis 0-->no symptoms 10/15/21 1907 Infiltration/Extravasation Assessment 0-->no symptoms 10/15/21 1907 Patient Education Instructed to keep IV site dry;Instructed to call nurse if site is painful,red,swollen, burning;Instructed to call nurse if fluid leaking from site 10/15/21 1016 Daily Review of Necessity ** completed 10/15/21 1016 IV Fluids:         Body Position: supine, log-rolledHead of Bed (HOB) Positioning: HOB at 30-45 degrees   Time of Last Void or Time that Urinary Catheter was Removed Intra-Op: has not voided Additional Info: to RR in stretcher. Alert and oriented. VSS on RA. Afebrile. L foot primary dressing CDI. Several other wounds, see flow sheets. Xrays in PACU. BG 103. Pleasant and cooperative. Please see flowsheets and MAR for further details. Contact RN (name and phone number): Alex [phone] Main PACU.

## 2021-10-15 NOTE — Other
-  CONSULT  REQUEST  DOCUMENTATION-[facility] NOTE-Type of consult: [facility] Internal Medicine -New Consult: [id number] Edwin [name] /Location: A01H/A01H / Reason for Consult? med co-managment and pre-op risk stratification/Callback Cell Phone: [phone] / Please confirm receipt of this message by texting back ?OK?-1 - Talked to Ayepah, R at 2346 who will look into consult order.Mobile Heartbeat message sent to Beaver, SR at 2354-[name]4/24/20237:17 [facility] [phone]

## 2021-10-15 NOTE — Other
Subsequent to admission for surgery or invasive procedure, I have reassessed the patient by examination and review of relevant data pertaining to the planned procedure. I have verified the planned procedure and there are no relevant changes since the H&P.  Cardiac: RRR, s1/s2Lungs: CTAB, no resp distressDenies f/n/v/sob/cpPatient to OR today for Left foot excisional debridement of all nonviable soft tissue and bone including Incision and DrainageConsent obtained; all details, potential risks and complications discussedNPO status confirmedSite markedElectronically Signed by Manus Rudd, DPM, October 15, 2021

## 2021-10-15 NOTE — Anesthesia Pre-Procedure Evaluation
This is a 71 y.o. male scheduled for LEFT FOOT INCISION & DRAINAGE BELOW FASCIA, W/WO TENDON SHEATH INVOLVEMENT, FOOT; SINGLE BURSAL SPACE (Left).Review of Systems/ Medical HistoryPatient summary, Labs, pre-procedure vitals, height, weight and NPO status reviewed.No previous anesthesia concernsAnesthesia Evaluation:   No history of anesthetic complications  Estimated body mass index is 27.41 kg/m? as calculated from the following:  Height as of this encounter: 5' 11 (1.803 m).  Weight as of this encounter: 89.1 kg. CC/HPI: 71 y.o. 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 Medicine is consulted for co-management. Cardiovascular:Patient has a history of: hypercholesterolemia and hypertension.  No angina. -Exercise tolerance: >4 METS -Coronary Artery Disease: CAD, CABG and cardiac stents -Vascular Disease:  Negative   -Other Cardiovascular:  CABG 2022. HEENT: Negative.Neuromuscular: -Neuropathy: peripheral neuropathyHematological/Lymphatic: -Anemia: Patient has anemia.  Endocrine/Metabolic: -Diabetes mellitus:  Patient has diabetes mellitus.Behavioral/Social/Psychiatric & Syndromes:  Patient has depression.Physical ExamCardiovascular:    Rhythm: regularPulmonary:  Exam deferredAirway:  Mallampati: IITM distance: >3 FBNeck ROM: fullMouth Opening: >3cmAnesthesia PlanASA 3 The primary anesthesia plan is  MAC. Perioperative Code Status confirmed: It is my understanding that the patient is currently designated as 'Full Code' and will remain so throughout the perioperative period.Anesthesia informed consent obtained. Consent obtained from: patientUse of blood products: consented  The post operative pain plan is IV analgesics and perineural.Plan discussed with CRNA.Anesthesiologist's Pre Op NoteI personally evaluated and examined the patient prior to the intra-operative phase of care on the day of the procedure.Marland Kitchen

## 2021-10-15 NOTE — Other
Admission Note Nursing Edwin [name] is a 71 y.o. male admitted with a chief complaint of left foot infection. Patient arrived from  Patient is   A/Ox4. Vitals:  10/14/21 1858 10/14/21 1945 10/14/21 2129 10/15/21 0052 BP: 128/78 128/74 127/86 125/69 Pulse: 80 84 (!) 93 77 Resp: 18 18 20 18  Temp:  99.6 ?F (37.6 ?C) 98.3 ?F (36.8 ?C) 98.9 ?F (37.2 ?C) TempSrc:  Oral Oral Oral SpO2: 98% 95% 96% 100% Weight:   89.1 kg  Height:   5' 11 (1.803 m)  Oxygen therapy Oxygen TherapySpO2: 100 %Device (Oxygen Therapy): room airI have reviewed the patient's current medication orders..I have reviewed patient valuables Belongings charted in last 7 days: Valuable(s) : Vision; Cabin crew; Money; Wallet; Other (Comments) (meds, cells, wallet, belt, jeans, boots. socks, hat, pocket knife, glasses, sunglasses,underwear, cash: 3-50's, 2-20's, 2-10's, 1-5's, 1-1's) (10/14/2021  9:29 PM) Comments:Pt is A/Ox4. No c/o pain. Tingling to hands. No sensation to BLE. Doppler to PD.VSS, on RA. Afebrile. +BS. CC diet-NPO at midnight. BG ACHS. LBM 4/24. Up to the toilet to void. Ambulate Ax1 w/ RW. NWB to LLE.Cont IV Vanco and Zosyn per MAR. PIVx2. Call light within reach. Safety precautions maintained. Meds and cash verified, locked nursing unit.See flowsheets, patient education and plan of care for additional information.

## 2021-10-15 NOTE — Plan of Care
Plan of Care Overview/ Patient Status    Pt AOx4, VSS, on RA. Pt denies pain. BLE dressings per podiatry. Pt continent, using the urinal. IV abx administered. Pt NPO for procedure. NWB to LLE, RW OOB, SBA. Refused bed alarm despite edu. Son at bedside, took patient's clothing home. Will CTM. 1630 - pt down to the ORProblem: Adult Inpatient Plan of CareGoal: Plan of Care ReviewOutcome: Interventions implemented as appropriateFlowsheets (Taken 10/15/2021 1425)Plan of Care Reviewed With:? patient? son

## 2021-10-15 NOTE — Other
[facility]	Preoperative Medical ConsultationPlanned Surgical Intervention/Procedure: Excisional Debridement with I/D Referring Physician:  Manus Rudd DPM Date of Surgery: 4/25/23___________________________ HPI: 23 M from [place] in [place] for short term contract work with pmhx of CAD ccb anginal only s/p remote PCI with x3 stent placement, CABG Jan 2022, controlled DM 6.1% ccb neuropathy, b/l LE OM s/p b/l toe amputations. USOH until March 8th when he noticed callous on L lower extremity anterior sole, showed to PCP in [place] and performed I/D at that time and tx with oral doxy. Reports progression of symptoms this week including body aches, chills, fever, and LLE pain. Home PCP changed abx to augmentin and bactrim. Today sent photo of foot to PCP and advised to present to hospital. Now admitted for LLE OM work up. Mild fever tonight, potential plan for OR tomorrow 4/25 @ 4 PM for I/D and debridement.  PMHx- Jan 2022; PCI to CABG, had angina never stemi  - PCI with stents placed > 18 years ago - angina; no chest pain since Jan 2022 - DM A1c 6.1% PSHx - Aug 2021 RLE toe amputation; no complications per pt - Oct 2022 LLE toe amputation; no complications per pt SHx - never smoker, no other substance use  - prior hx of severe AUD ccb DT, in stable remission for 7 years Meds - lexapro 10 mg daily - metoprolol  50 mg XL - ASA 350 mg daily - gabapentin 300 TID - xigdou 10 mg / 2 g daily Preoperative Revised Cardiac Risk Index:Developed from stable pts > 50 yrs undergoing elective major non-cardiac procedures. High-Risk Surgery: NoIschemic Heart Disease: YesHistory of Congestive Heart Failure: NoHistory of Cerebrovascular Disease: No no Insulin Therapy for Diabetes Mellitus: No Perioperative Serum Creatinine >2.0 mg/dL: NoThe Revised Cardiac Risk Index: score is:  1- Low Risk. Estimated Rate of Myocardial Infarction, Pulmonary Edema, Ventricular Fibrillation, Cardiac Arrest, or Complete Heart Block is 6.0%Gupta Perioperative Cardiac Risk- Probability for myocardial infarction or cardiac arrestEstimates pt risk  but should not be taken as an absolute. Known/remote coronary artery disease was not  in the analysis. Clinician judgement is required for each individual pt risk assessment.Chales Abrahams Estimated Total Risk:Estimated Total Risk 0.1%  for perioperative myocardial infarction or cardiac arrest NSQIP:Functional capacity:?	4-10 MET's (Climb a flight of stairs/hill, Walk briskly, heavy housework, double tennis, Golf):  Limited by SOB, reports no angina or chest discomfort since Jan 2022 CABG. Anesthesia History: has had general anesthesia without problems.Pulmonary Risk Assessment:Respiratory infection in last month: No Undergoing upper abdominal or intrathoracic surgery: NoIs planned surgery upper abdominal or intrathoracic: NoKnown OSA: NoSuspected OSA: Low OSA RiskHistory of anemia: NoReduced oxygen saturation: No Duration of surgery > 2 hours: No Current Smoker: No Smoker within the year: NoThromboembolic Risk Assessment:Prior DVT / PE:  NoHypercoaguable state: NoStroke in past month: NoBMI > 25: NoCaprini Risk Score = 1-2 [low] Either compression boots or prophylactic anticoagulation during hospitalization. VTE rate = 1.5%Bleeding problems (self or family):  NoFamily Hx anesthetic problems: No Antiplatelet use:  NoAnticoag use:  NoInsulin use:   NoOral antiDM use:  NoSteroid use past year: NoSmoking status: NoAlcohol use: NoRecreational drug use: No____________________________Medications: No current facility-administered medications on file prior to encounter. No current outpatient medications on file prior to encounter. Allergies: No Known AllergiesPMH/PSH: No past medical history on file.No past surgical history on file.Physical Exam: Vitals:  10/14/21 1602 10/14/21 1858 10/14/21 1945 10/14/21 2129 BP: 136/82 128/78 128/74 127/86 Pulse: 82 80 84 (!) 93 Resp: 18 18 18  20 Temp: 98 ?F (36.7 ?C)  99.6 ?F (37.6 ?C) 98.3 ?F (36.8 ?C) TempSrc: Oral  Oral Oral SpO2: 98% 98% 95% 96% Weight: 85.3 kg   89.1 kg Height: 5' 11 (1.803 m)   5' 11 (1.803 m) GENERAL: Pleasant and conversant, in no apparent distress. HEENT: No appreciable JVD. Oropharynx without observable lesions. Moist mucous membranes.  Mallampati score = III (soft and hard palate and base of uvula visible)CV: Regular.  Normal S1 and S2.  No murmur or gallop.\ULM: Normal expansion bilaterally. Lung sounds are clear in all fields. ABD: Soft, Non-tender, Non-distended.  Bowel sounds presnt. EXT: No edema. Palpable dorsal pedal and post tibial pulses bilaterally.PSYCH: Normal affect and mentation. Prior 6 months Diagnostic studies: Recent Results (from the past 4368 hour(s)) CBC auto differential  Collection Time: 10/14/21  4:28 PM Result Value Ref Range  WBC 11.2 (H) 4.0 - 11.0 x1000/?L  RBC 4.22 4.00 - 6.00 M/?L  Hemoglobin 10.7 (L) 13.2 - 17.1 g/dL  Hematocrit 84.16 (L) 60.63 - 50.00 %  MCV 79.1 (L) 80.0 - 100.0 fL  MCH 25.4 (L) 27.0 - 33.0 pg  MCHC 32.0 31.0 - 36.0 g/dL  RDW-CV 01.6 01.0 - 93.2 %  Platelets 290 150 - 420 x1000/?L  MPV 8.3 8.0 - 12.0 fL  Neutrophils 75.9 (H) 39.0 - 72.0 %  Lymphocytes 13.0 (L) 17.0 - 50.0 %  Monocytes 8.7 4.0 - 12.0 %  Eosinophils 1.6 0.0 - 5.0 %  Basophil 0.4 0.0 - 1.4 %  Immature Granulocytes 0.4 0.0 - 1.0 %  nRBC 0.0 0.0 - 1.0 %  ANC(Abs Neutrophil Count) 8.46 (H) 2.00 - 7.60 x 1000/?L  Absolute Lymphocyte Count 1.45 0.60 - 3.70 x 1000/?L  Monocyte Absolute Count 0.97 0.00 - 1.00 x 1000/?L  Eosinophil Absolute Count 0.18 0.00 - 1.00 x 1000/?L  Basophil Absolute Count 0.04 0.00 - 1.00 x 1000/?L  Absolute Immature Granulocyte Count 0.05 0.00 - 0.30 x 1000/?L  Absolute nRBC 0.00 0.00 - 1.00 x 1000/?L No results found for this or any previous visit (from the past 4368 hour(s)). XR Chest PA and LateralResult Date: 4/24/2023Cardiac silhouette is enlarged. Status post CABG. Mild interstitial septal thickening, likely represents edema and/or infection. No large pleural effusion or pneumothorax. Reported And Signed By: Madie Reno, MD  [facility] Biomedical [facility]  Results for orders placed or performed during the hospital encounter of 10/14/21 EKG Result Value Ref Range Status  Heart Rate 91 bpm Final  QRS Interval 135 ms Final  QT Interval 402 ms Final  QTC Interval 495 ms Final  P Axis 76 deg Final  QRS Axis -65 deg Final  T Wave Axis 106 deg Final  P-R Interval 219 msec Final  SEVERITY Abnormal ECG severity Final   Comment: :Sinus rhythm:Prolonged PR interval:Probable left atrial enlargement:RBBB and LAFB:LVH with secondary repolarization abnormality:Electronically Signed On 10-14-2021 19:40:33 EDT by Flint Melter, MD  No results found for this or any previous visit. ___________________________Assessment:    71 y.o. 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 On home high dose ASA for unclear reasoning, possibly for angina or for CABG Jan 2022. PMHx for controlled DM, and CAD with angina, now stably resolved. On vancomycin and zosyn. The patient is at Low Risk for thisLow Risk procedure.Estimated Risk of Major Adverse Cardiac Events?	Revised Cardiac Index:  6 % ?	Gupta    0.1 %?	NSQIP:   0.3%To best optimize for surgery, specific perioperative recommendations include:Discontinue NSAIDs 48  hours prior to surgeryDiscontinue aspirin for one (1) week prior to surgery; if possible will discuss with attending Monitor pulmonary status and use nebulized albuterol, incentive spirometry as needed perioperativelyNPO midnight Follow up Plans: will continue to follow @KAHSIGN @Preferred  contact  MHB___________________________ Attending Addendum:

## 2021-10-15 NOTE — Other
Post Anesthesia Transfer of Care NotePatient: Edwin C HeathProcedure(s) Performed: Procedure(s) (LRB):LEFT FOOT TRANSMETATARSAL AMPUTATION (Left) Patient location: PACU Last Vitals: Vitals Value Taken Time BP 96/67 10/15/21 1906 Temp 36.3 ?C 10/15/21 1906 Pulse 81 10/15/21 1907 Resp 20 10/15/21 1907 SpO2 96 % 10/15/21 1907 Vitals shown include unvalidated device data.Level of consciousness: awake, alert  and orientedTransport Vital Signs:  Stable since the last set of recorded intra-operative vital signsIntra-operative Complications: noneIntra-operative Intake & Output and Antibiotics as per Anesthesia record and discussed with the RN.

## 2021-10-15 NOTE — Other
-  CONSULT  REQUEST  DOCUMENTATION-[facility] NOTE-Type of consult: [facility] Vascular Surgery -New Consult: [id number] [name] /Location: L273/L273-02 / Brief Clinical Question: 20m PMHx Dm presenting with diabetic foot ulcer and non-palpable pulses, perfusion assessment/Callback Cell Phone: [phone] / Please confirm receipt of this message by texting back ?OK?-2 - Smartweb Page sent to [facility] Vascular Thoracic Surgery [id number] at 8:16 AM.-Haylen Bellotti Justice Deeds, PCT4/25/20238:16 [employer] [phone]

## 2021-10-15 NOTE — Other
VANCOMYCIN CONSULT Loading Dose: N/ACurrent Vancomycin Order: 1.25 g IV every 12 hours.  Vancomycin Indication: Suspected MRSA infectionRenal Function: StableScr: Creatinine (mg/dL) Date Value 54/02/8118 1.00 10/14/2021 1.10  CrCl: Estimated Creatinine Clearance: 74 mL/min (by C-G formula based on SCr of 1 mg/dL).Day of Therapy: 1Planned Duration of Therapy: TBDTrough Goal: 10-15Vancomycin level: not indicated at this time based on the following:  Patient has not yet reached steady state. The pharmacist will follow daily and schedule level per protocol. ID/AST Consulted? NoYNHH/LMH/WH Vancomycin Dosing GuidelineBH Vancomycin Dosing GuidelineFor questions, please contact the pharmacist: Barron Alvine, [facility]         Phone/Mobile Heartbeat: MHB

## 2021-10-15 NOTE — Other
Medicine Consult Note Hospital Day: 1Subjective Patient endorses pain in foot and in bilateral hands. States has not been taking statin recently due to joint aches, otherwise medicines are as described previously. No chest pain or shortness of breath recently. He walked up 5 flights of stairs within the last month or so.  ObjectiveVitals:  10/15/21 1228 BP: 114/70 Pulse: 70 Resp: 20 Temp: 98.3 ?F (36.8 ?C)  Exam: Gen: Talkative well appearing gentleman conversational while lying flat, comfortable appearingCV: RRR, no murmur or gallopPulm: No increased WOB, breath sounds clear to auscultation bilaterallyLE: No edema in ankles, feet wrapped bilaterallySkin: Bilateral dorsal hand erythema and scabbing Labs notable for: Cr: 0.8WBC: 8.3Hgb: 11. : 78.8CRP: 169  Assessment & Plan70 y.o. 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 Medicine is consulted for co-management. # foot wounds- plan for I&D in OR today. - Abx per podiatry	- Vanc/Zosyn 4/24 - present- Pain control per podiatry- Miralax standing- Hold Lovenox 40 QD, re-start tomorrow#microcytic anemiaLikely 2/2 chronic foot wounds/OM. Consider iron studies, ask about colonoscopy.- CBC daily#T2DMDiet : NPO now, regular post-surgery- Regular LDSSI Q6 while NPO#CADS/p CABG Jan 2022.  Reports recovering well. Remote stents, unclear why full ASA as opposed to DAPT.- ASA 81 mg QD for now- metoprolol 25mg  BID- Start rosuvastatin 5mg  QD tomorrow#sunburn - Lubriderm ordered for bilateral hands#Depression- Lexapro 10mg QDDiscussed with attending please await addendumKelly Fredonia Highland MD Internal Medicine-Primary Care PGY-2Preferred Contact MHB4/25/2023

## 2021-10-16 ENCOUNTER — Encounter: Admit: 2021-10-16 | Payer: PRIVATE HEALTH INSURANCE

## 2021-10-16 LAB — CBC WITH AUTO DIFFERENTIAL
BKR WAM ABSOLUTE IMMATURE GRANULOCYTES.: 0.03 x 1000/ÂµL (ref 0.00–0.30)
BKR WAM ABSOLUTE LYMPHOCYTE COUNT.: 1.36 x 1000/ÂµL (ref 0.60–3.70)
BKR WAM ABSOLUTE NRBC (2 DEC): 0 x 1000/ÂµL (ref 0.00–1.00)
BKR WAM ANALYZER ANC: 4.77 x 1000/ÂµL (ref 2.00–7.60)
BKR WAM BASOPHIL ABSOLUTE COUNT.: 0.07 x 1000/ÂµL (ref 0.00–1.00)
BKR WAM BASOPHILS: 1 % (ref 0.0–1.4)
BKR WAM EOSINOPHIL ABSOLUTE COUNT.: 0.57 x 1000/ÂµL (ref 0.00–1.00)
BKR WAM EOSINOPHILS: 7.8 % — ABNORMAL HIGH (ref 0.0–5.0)
BKR WAM HEMATOCRIT (2 DEC): 34.1 % — ABNORMAL LOW (ref 38.50–50.00)
BKR WAM HEMOGLOBIN: 10.5 g/dL — ABNORMAL LOW (ref 13.2–17.1)
BKR WAM IMMATURE GRANULOCYTES: 0.4 % (ref 0.0–1.0)
BKR WAM LYMPHOCYTES: 18.6 % (ref 17.0–50.0)
BKR WAM MCH (PG): 24.8 pg — ABNORMAL LOW (ref 27.0–33.0)
BKR WAM MCHC: 30.8 g/dL — ABNORMAL LOW (ref 31.0–36.0)
BKR WAM MCV: 80.6 fL (ref 80.0–100.0)
BKR WAM MONOCYTE ABSOLUTE COUNT.: 0.51 x 1000/ÂµL (ref 0.00–1.00)
BKR WAM MONOCYTES: 7 % (ref 4.0–12.0)
BKR WAM MPV: 8.1 fL (ref 8.0–12.0)
BKR WAM NEUTROPHILS: 65.2 % (ref 39.0–72.0)
BKR WAM NUCLEATED RED BLOOD CELLS: 0 % (ref 0.0–1.0)
BKR WAM PLATELETS: 300 x1000/ÂµL (ref 150–420)
BKR WAM RDW-CV: 14.7 % (ref 11.0–15.0)
BKR WAM RED BLOOD CELL COUNT.: 4.23 M/ÂµL (ref 4.00–6.00)
BKR WAM WHITE BLOOD CELL COUNT: 7.3 x1000/ÂµL (ref 4.0–11.0)

## 2021-10-16 LAB — FERRITIN: BKR FERRITIN: 126 ng/mL (ref 30–400)

## 2021-10-16 LAB — IRON AND TIBC
BKR IRON SATURATION: 15 % (ref 15–50)
BKR IRON: 30 ug/dL — ABNORMAL LOW (ref 59–158)
BKR TOTAL IRON BINDING CAPACITY: 199 ug/dL — ABNORMAL LOW (ref 250–450)

## 2021-10-16 LAB — VITAMIN B12: BKR VITAMIN B12: 217 pg/mL — ABNORMAL LOW (ref 232–1245)

## 2021-10-16 MED ORDER — INSULIN LISPRO 100 UNIT/ML (SLIDING SCALE)
100 unit/mL | Freq: Three times a day (TID) | SUBCUTANEOUS | Status: CP
Start: 2021-10-16 — End: ?
  Administered 2021-10-17 – 2021-10-24 (×8): 100 unit/mL via SUBCUTANEOUS

## 2021-10-16 MED ORDER — CYANOCOBALAMIN (VIT B-12) 1,000 MCG TABLET
1000 MCG | Freq: Every day | ORAL | Status: DC
Start: 2021-10-16 — End: 2021-11-02
  Administered 2021-10-16 – 2021-11-01 (×16): 1000 MCG via ORAL

## 2021-10-16 MED ORDER — ENOXAPARIN 40 MG/0.4 ML SUBCUTANEOUS SYRINGE
40 mg/0.4 mL | SUBCUTANEOUS | Status: DC
Start: 2021-10-16 — End: 2021-11-02
  Administered 2021-10-16 – 2021-11-01 (×13): 40 mg/0.4 mL via SUBCUTANEOUS

## 2021-10-16 MED ORDER — ROSUVASTATIN 10 MG TABLET
10 mg | Freq: Every day | ORAL | Status: DC
Start: 2021-10-16 — End: 2021-10-24
  Administered 2021-10-16 – 2021-10-24 (×9): 10 mg via ORAL

## 2021-10-16 NOTE — Progress Notes
[facility]	Podiatry Progress Note4/26/2023Subjective: No events overnight. Pt is in agreeable manner. Denies pain to the left foot LE. Denies f/n/v/cp/sob.S/p left foot transmetatarsal amputation, partially OPEN (DOS: 10/15/2021).Objective: VitalsTemp:  [97.3 ?F (36.3 ?C)-98.7 ?F (37.1 ?C)] 98.6 ?F (37 ?C)Pulse:  [58-99] 62Resp:  [13-20] 20BP: (96-135)/(67-73) 118/71SpO2:  [96 %-100 %] 97 %Gen: Alert, no distressLeft foot: Central surgical wound with exposed bone.  No purulence on manual expression.  Hemostatic.  Surrounding erythema edema consistent postoperative course.  Sutures intact. No active bleeding. Neurovascular status unchanged.Labs: Recent Labs   04/24/231628 04/24/231941 04/24/232241 04/25/230941 04/25/231212 04/25/231225 04/25/232103 WBC 11.2*  --   --  8.3  --   --   --  HGB 10.7*  --   --  11.1*  --   --   --  HCT 33.40*  --   --  35.40*  --   --   --  CREATININE 1.10 1.00  --   --  0.80  --   --  BUN 18 19  --   --  11  --   --  CO2 25 20  --   --  18*  --   --  HSCRP 182.5* 169.8*  --   --   --   --   --  SEDRATE 49*  --   --  66*  --   --   --  PLT 290  --   --  296  --   --   --  INR 1.06  --   --   --   --   --   --  GLU 135* 130*   < >  --  106*   < > 107* K 4.5 4.4  --   --  4.4  --   --  NA 143 139  --   --  137  --   --  CL 106 107  --   --  107  --   --   < > = values in this interval not displayed. Imaging: XR Left Foot: 10/15/2021 - pending final readMicro/Path: Micro: 10/15/2021 - prelim1. Left 2nd toe wound2. Left 3rd toe bone3. Left foot tissue4. Left 1st metatarsal clean margin5. Left 2nd metatarsal clean margin6. Left 3rd metatarsal clean margin7. Left 4th metatarsal clean margin8. Left 5th metatarsal clean margin?Path: 10/15/2021 - pending1. Left 2nd toe wound2. Left 3rd toe bone3. Left foot tissue4. Left 1st metatarsal clean margin5. Left 2nd metatarsal clean margin6. Left 3rd metatarsal clean margin7. Left 4th metatarsal clean margin8. Left 5th metatarsal clean margin9. Left TMAAssessment/Plan: 71 y.o. male with S/p left foot transmetatarsal amputation, partially OPEN (DOS: 10/15/2021). - Dressings changed dakins, DSD- Podiatry will perform dressing changes as indicated- Nursing please reinforce dressing PRN with ABDs, 4x4 gauze, kerlix, ACE?Abx- vanc/zosyn?Consult- Medicine: for medical comanagement and pre-op risk stratification- VS: for perfusion assessment?Diet: CCActivity: NWB to LLE, WBAT to RLEDVT Ppx: lovenoxCode: Full?Dispo:  Pending micro and path results and clinical improvement likely stage procedure this admission?Will discuss with attending Dr. Matthias Hughs Please page on-call podiatry resident with questions:?	SEE AMION FOR CALL SCHEDULEElectronically Signed by Budd Palmer, DPM, April 26, 2023Discussed with resident, agree with plan. Electronically Signed by Paulene Floor, DPM, October 16, 2021

## 2021-10-16 NOTE — Plan of Care
Problem: Adult Inpatient Plan of CareGoal: Plan of Care ReviewOutcome: Interventions implemented as appropriate Problem: Fall Injury RiskGoal: Absence of Fall and Fall-Related InjuryOutcome: Interventions implemented as appropriate Problem: Impaired Wound HealingGoal: Optimal Wound HealingOutcome: Interventions implemented as appropriate Pt is A/O x 4. Denies any SOB, chest pain, calf tenderness. VSS on RA, afebrile.Pt s/p L TMA.NWB to LLE.L foot dressing is CD & I & elevated with 2 pillows.PT eval today.Pt on IV Vanco & IV Zosyn.  Call bell and belongings within reach. Safety maintained. BP 116/67  - Pulse (!) 58  - Temp 97.9 ?F (36.6 ?C) (Oral)  - Resp 18  - Ht 5' 11 (1.803 m)  - Wt 89.1 kg  - SpO2 97%  - BMI 27.41 kg/m?

## 2021-10-16 NOTE — Plan of Care
Plan of Care Overview/ Patient Status    Pt AOx4, VSS. On RA, no resp distress. Pt denies pain. Pt reports no sensation to the lower extremities. Both LE dressings managed by podiatry service. LLE dressing changed this morning per podiatry, elevated on x2 pillows, C/D/I. RLE with gauze, C/D/I. Pt continent, using the urinal, reports one BM today. Pt independent in the bed. Calls appropriately. Up to toilet Ax1 RW with NWB to LLE. Will CTM.Problem: Adult Inpatient Plan of CareGoal: Plan of Care ReviewOutcome: Interventions implemented as appropriateFlowsheets (Taken 10/16/2021 1022)Plan of Care Reviewed With: patient

## 2021-10-16 NOTE — Plan of Care
Plan of Care Overview/ Patient Status    1900-0700Pt transferred to floor from PACU, post left foot I&D with metatarsal amputation. Pt denies pain, is aaox4, conversing freely and breathing with room air. No signs of cardiac or respiratory distress noted. Pt able to transfer from stretcher to bed unaided. Left foot wound dressing CDI, no drainage noted to right foot ulcer, dressing also CDI. IV patent and flushed without concern. Head to toe assessment unremarkable, please see flowsheets for further details. Due medications given as prescribed. Pt's VS WNL. BGM 109, sliding scale insulin held as order parameters not met. Bed in lowest position, wheels locked and call bell within reach. Will continue to monitor pt.

## 2021-10-16 NOTE — Plan of Care
Plan of Care Overview/ Patient Status    Inpatient Physical Therapy Evaluation IP Adult PT Eval/Treat - 10/16/21 1115    Date of Visit / Treatment  Date of Visit / Treatment 10/16/21   Note Type Evaluation   Progress Report Due 10/30/21   Start Time 1045   End Time 1115   Total Treatment Time 30    General Information  Pertinent History Of Current Problem per chart 71 y.o. male with past medical history of diabetes presenting with bilateral foot wounds.  Left foot wound with acute signs of infection   Subjective there are no stairs to get into my basement, they can drive the car right up to the door   Referring Physician  D'Angelo   General Observations received supine in bed, LLE dresings intact, NAD   Precautions/Limitations Fall Precautions   Precautions/Limitations Comment NWB LLE    Weight Bearing Status  Weight Bearing Status Comments NWB LLE    Prior Level of Functioning/Social History  Additional Comments Pt lives with spouse in a multi-level home in [place]. At home, he reports owning a RW and w/c. His plan is to fly back home following d/c from hospital. Wife can physically assist as needed.    Vital Signs and Orthostatic Vital Signs  Vital Signs Free text NAD on RA    Pain/Comfort  Pain Comment (Pre/Post Treatment Pain) no c/o    Cognition  Overall Cognitive Status WFL    Range of Motion  Range of Motion Examination WFL except   Range of Motion Comments L ankle NT    Manual Muscle Testing  Manual Muscle Testing Results No strength deficits were identified    Muscle Tone  Muscle Tone Testing Results No muscle tone deficits noted    Sensory Assessment  Sensory Tests Results No sensory impairment noted    Skin Assessment  Skin Assessment See Nursing Documentation    Balance  Sitting Balance: Static  FAIR+     Maintains static position without assist or device, may require Supervision or Verbal Cues (>2 minutes)   Sitting Balance: Dynamic  FAIR+    Performs dynamic activities through full range with Supervision   Standing Balance: Static FAIR+     Maintains static position without assist or device, may require Supervision or Verbal Cues (>2 minutes)   Standing Balance: Dynamic  FAIR-     Performs dynamic activities through partial range (50-75%) with Contact Guard   Balance Assist Device Rolling walker   Balance Skills Training Comment Ax1    Bed Mobility  Supine-to-Sit Independence/Assistance Level Contact guard   Supine-to-Sit Assist Device Bed rails   Sit-to-Supine Independence/Assistance Level --   Sit-to-Supine Assist Device --   Bed Mobility Comments steady    Sit-Stand Transfer Training  Sit-to-Stand Transfer Independence/Assistance Level Contact guard   Sit-to-Stand Transfer Assist Device Rolling walker   Stand-to-Sit Transfer Independence/Assistance Level Contact guard   Stand-to-Sit Transfer Assist Device Rolling walker   Sit-Stand Transfer Comments cues for sequencing    Toilet Transfer Training  Toilet Transfer Independence/Assistance Level Level Contact guard   Toilet Transfer Assist Device Rolling walker;Grab bars   Toilet Transfer Comments decent eccentric contorl, assist for pulling up underwear following + void/BM, (I) with hygiene    Gait Training  Independence/Assistance Level  Contact guard   Assistive Device  Rolling walker   Gait Distance 15 feet;x2   Gait Pattern Analysis 3-point gait   Gait Training Comments cues initially to keep LLE elevated, improved ability to self-sequence  with ambulation back from bathroom    Stair Performance  Stair Performance Comment NT    Handoff Documentation  Handoff Patient in chair;Chair alarm;Patient instructed to call nursing for mobility;Discussed with nursing    PT- AM-PAC - Basic Mobility Screen- How much help from another person do you currently need.....  Turning from your back to your side while in a a flat bed without using rails? 4 - None - Does not require any help and does the activity independently. Can use assistive devices.   Moving from lying on your back to sitting on the side of a flat bed without using bed rails? 4 - None - Does not require any help and does the activity independently. Can use assistive devices.   Moving to and from a bed to a chair (including a wheelchair)? 3 - A Little - Requires a little help (supervision, minimal assistance). Can use assistive devices.   Standing up from a chair using your arms(e.g., wheelchair or bedside chair)? 3 - A Little - Requires a little help (supervision, minimal assistance). Can use assistive devices.   To walk in a hospital room? 3 - A Little - Requires a little help (supervision, minimal assistance). Can use assistive devices.   Climbing 3-5 steps with a railing? 3 - A Little - Requires a little help (supervision, minimal assistance). Can use assistive devices.   AMPAC Mobility Score 20   TARGET Highest Level of Mobility Mobility Level 6, Walk 10+steps    Clinical Impression  Initial Assessment Pt tolerated evaluation fairy; Ax1 and RW utilized for STS transfers, 3-point gait, and ADLs in restroom. Pt demonstrates adequate balance, strength, and safety awareness for safe d/c home with family assist. Pt anticipated to fly back to [place] following discharge; rec RW and w/c for safety.   Criteria for Skilled Therapeutic Interventions Met yes;treatment indicated   Rehab Potential good, to achieve stated therapy goals    Patient/Family Stated Goals  Patient/Family Stated Goal(s) return home    Frequency/Equipment Recommendations  PT Frequency 3x per week   What day of week is next treatment expected? Thursday   PT/PTA completing this assessment Deago Burruss    PT Recommendations for Inpatient Admission  Activity/Level of Assist ambulate;assist of 1;with rolling walker    Planned Treatment / Interventions  Education Treatment / Interventions Patient Education / Training   Planned Treatment/Interventions Comments POC, PT role, d/c recs    PT Discharge Summary  Physical Therapy Disposition Recommendation Home   Additional Physical Therapy Disposition Recommendations Home with family support;Home Physical Therapy   Equipment Recommendations for Discharge Rolling walker;Wheelchair     Problem: Physical Therapy GoalsGoal: Physical Therapy GoalsDescription: PT GOALS1. Patient will perform bed mobility independently2. Patient will perform transfers with least assistive device with modified independence3. Patient will ambulate a minimum of 50 feet with least assistive device with modified independenceOutcome: Initial problem identification Jacqulyn Bath, DPTMHB: [phone]

## 2021-10-16 NOTE — Progress Notes
Vascular Surgery Progress NoteInterval Hx- s/p L foot TMA with podiatry- NAEO - afebrile, HDSO. Vitals reviewed4/26/2023- 1 Day Post-OpTemp:  [97.3 ?F (36.3 ?C)-98.7 ?F (37.1 ?C)] 97.3 ?F (36.3 ?C)Pulse:  [58-99] 63Resp:  [13-20] 18BP: (96-135)/(67-73) 135/72SpO2:  [96 %-100 %] 96 %  RA  Physical Exam: Gen: laying in bedHEENT: NCATCV: RRPulm: unlabored on RAAbd: SNTNDExt: L foot with postoperative dressing above ankle; vascular exam deferred given postoperative wound and podiatry's dressingRecent Labs Lab 04/24/231628 04/25/230941 WBC 11.2* 8.3 HGB 10.7* 11.1* HCT 33.40* 35.40* PLT 290 296 Recent Labs Lab 04/24/231628 04/24/231941 04/24/232241 04/25/231212 04/25/231225 04/25/231630 04/25/231908 04/25/232103 NA 143 139  --  137  --   --   --   --  K 4.5 4.4  --  4.4  --   --   --   --  CL 106 107  --  107  --   --   --   --  CO2 25 20  --  18*  --   --   --   --  BUN 18 19  --  11  --   --   --   --  CREATININE 1.10 1.00  --  0.80  --   --   --   --  GLU 135* 130*   < > 106*   < > 92 103* 107* CALCIUM 10.2 9.4  --  9.3  --   --   --   --   < > = values in this interval not displayed. No results for input(s): ALT, AST, ALKPHOS, GGT, BILITOT, BILIDIR, AMYLASE, LIPASE in the last 168 hours.Recent Labs Lab 04/24/231628 PTT 30.4 INR 1.06 ANKLE BRACHIAL INDICES BILAT AND CW/PVR MULTILEVELS?COMPARISON: None.?INDICATION: Nonpalpable pulses.  ?TECHNIQUE: Pressures were obtained, ABIs were calculated and PVR waveforms were obtained in the thigh, calf, ankle, metatarsal and great toe regions of both lower extremities.?FINDINGS: The pressures in mmHg are as noted below. The thigh, calf, ankle and toe brachial indices are in parentheses.?RIGHT:?Brachial- 115?Proximal thigh- 159,(1.22)   Calf- 185,(1.42)Dorsalis pedis- 157, (1.21)Posterior tibial- 108, (0.83)Toe- 127, (0.98)?LEFT:?Brachial- 130?Proximal thigh- 154,(1.18) Calf- 165, (1.27) Dorsalis pedis- 142, (1.09)Posterior tibial- 156, (1.20)Toe- status post amputation?Significant pressure gradients are noted on the right between the calf and dorsalis pedis artery as well as the calf and posterior tibial artery. 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 If clinically indicated, further evaluation with a CTA runoff may be considered.US DUPLEX LOWER EXTREMITY ARTERIES BILATERAL?INDICATION: nonpalpable pulses.?COMPARISON: None available.?FINDINGS: ?Real-time duplex on the examination of the lower extremity arterial systems was performed bilaterally from the levels of the common iliac arteries to the ankles.?Right lower extremity peak systolic velocities (cm/s):?External iliac artery:  97Common femoral artery:  57Profunda femoral artery:  38Proximal superficial femoral artery:  superficial femoral artery:  61Distal superficial femoral artery:  74Popliteal artery:  42Proximal anterior tibial artery:  anterior tibial artery:  69Distal anterior tibial artery:  107Proximal peroneal artery:  peroneal artery: 32Distal peroneal artery: 32Proximal posterior tibial artery:  posterior tibial artery:  51Distal posterior tibial artery:  No flow seen.Dorsalis pedis artery:  9.8?Grayscale and color Doppler imaging of the right lower extremity demonstrates calcified plaque within the proximal right external iliac artery.?No Doppler detectable flow seen  in the right distal posterior tibial artery, which may be related to occlusion versus very slow flow. Remainder of the right calf arteries and the right dorsalis pedis are patent.?Left lower extremity peak systolic velocities (cm/s):?External iliac artery:  138Common femoral artery:  107Profunda femoral artery:  60Proximal superficial femoral artery:  superficial femoral artery:  96Distal superficial femoral artery:  96Popliteal artery:  107Proximal anterior tibial artery:  anterior tibial artery:  11Distal anterior tibial artery:  33Proximal peroneal artery:  peroneal artery:  156Distal peroneal artery:  165Proximal posterior tibial artery:  posterior tibial artery:  No flow seen.Distal posterior tibial artery:  31, retrograde flow.Dorsalis pedis artery:  24, low resistance waveform?Grayscale and color Doppler imaging of the left lower extremity demonstrates calcified plaque at the proximal left external iliac artery.?IMPRESSION:?Patent bilateral femoropopliteal arteries without significant stenotic disease.?No Doppler detectable flow seen in the right distal posterior tibial artery, which may be related to occlusion versus very slow flow. 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 ABI/PVR 4/25 - 1.2. Ultrasound patent. Left TMA with Podiatry 4/25. Has adequate perfusion to heal his surgical site. - Please call with any further questionsFinal plans pending attending attestationCaroline Ralene Muskrat, MD04/26/237:15 [facility] Resident AddendumI saw this patient with Dr. Dimple [name] (Attending). Edwin [name] has adequate perfusion per Podiatry team - excellent intra-operative bleeding. No need for further imaging nor interventions from Vascular Surgery perspective. Please call back with questions.Lindwood Coke, MD4/26/202310:31 AM

## 2021-10-16 NOTE — Other
[facility]	Podiatry Post-Operative Note 4/25/2023Subjective: Pt seen at bedside in NAD. Denies pain to left foot. Denies n/v/f/c/sob/cp.S/p left foot transmetatarsal amputation, partially OPEN (DOS: 10/15/2021).Objective: Vitals: Vitals:  10/15/21 1915 BP: 117/70 Pulse: 79 Resp: (!) 13 Temp:  Gen: AAOx3, NADChest: CTAB, no respiratory distress Cardio: RRR, S1, S2Left LE: Dressing c/d/i with no strikethrough present. Labs: Lab Results Component Value Date  WBC 8.3 10/15/2021  HGB 11.1 (L) 10/15/2021  HCT 35.40 (L) 10/15/2021  PLT 296 10/15/2021  NA 137 10/15/2021  K 4.4 10/15/2021  CREATININE 0.80 10/15/2021  BUN 11 10/15/2021  HSCRP 169.8 (H) 10/14/2021  SEDRATE 66 (H) 10/15/2021  INR 1.06 10/14/2021  GLU 103 (H) 10/15/2021 Micro/Path: Micro: 10/15/2021 - pending1. Left 2nd toe wound2. Left 3rd toe bone3. Left foot tissue4. Left 1st metatarsal clean margin5. Left 2nd metatarsal clean margin6. Left 3rd metatarsal clean margin7. Left 4th metatarsal clean margin8. Left 5th metatarsal clean marginPath: 10/15/2021 - pending1. Left 2nd toe wound2. Left 3rd toe bone3. Left foot tissue4. Left 1st metatarsal clean margin5. Left 2nd metatarsal clean margin6. Left 3rd metatarsal clean margin7. Left 4th metatarsal clean margin8. Left 5th metatarsal clean margin9. Left TMAImaging: XR Left Foot: 10/15/2021 - pendingAssessment/Plan: 71 y.o. male s/p left foot transmetatarsal amputation, partially OPEN (DOS: 10/15/2021). Patient doing well post-operatively. - Resume pre-op medication regimen and diet. - Will resume Abx- Podiatry will perform dressing changes starting POD1- Dakin's solution ordered- Nursing: please reinforce dressing PRN for strike-through with DSD - NWB to left foot- PT consult placed with above WB status and restrictions- Elevate left lower extremity on 2+ pillows- Post-op x-rays ordered Dispo: Pending intraop micro path, patient clinical improvement.Discussed with attending Dr. D'AngeloElectronically Signed by Heidi Dach, DPM, April 25, 2023Discussed with resident, agree with plan. Electronically Signed by Paulene Floor, DPM, October 15, 2021

## 2021-10-16 NOTE — Anesthesia Post-Procedure Evaluation
Anesthesia Post-op NotePatient: Edwin Dredge C HeathProcedure(s):  Procedure(s) (LRB):LEFT FOOT TRANSMETATARSAL AMPUTATION (Left) Patient location: PACULast Vitals:  I have noted the vital signs as listed in the nursing notes.Mental status recovered: patient participates in evaluation: YesVital signs reviewed: YesRespiratory function stable:YesAirway is patent: YesCardiovascular function and hydration status stable: YesPain control satisfactory: YesNausea and vomiting control satisfactory:Yes No notable events documented.

## 2021-10-17 LAB — BASIC METABOLIC PANEL
BKR ANION GAP: 12 (ref 7–17)
BKR BLOOD UREA NITROGEN: 10 mg/dL (ref 8–23)
BKR BUN / CREAT RATIO: 12.5 (ref 8.0–23.0)
BKR CALCIUM: 9.8 mg/dL (ref 8.8–10.2)
BKR CHLORIDE: 106 mmol/L (ref 98–107)
BKR CO2: 21 mmol/L (ref 20–30)
BKR CREATININE: 0.8 mg/dL (ref 0.40–1.30)
BKR EGFR, CREATININE (CKD-EPI 2021): >60 mL/min/{1.73_m2} (ref >=60)
BKR GLUCOSE: 197 mg/dL — ABNORMAL HIGH (ref 70–100)
BKR POTASSIUM: 4.5 mmol/L (ref 3.3–5.3)
BKR SODIUM: 139 mmol/L (ref 136–144)

## 2021-10-17 LAB — CBC WITH AUTO DIFFERENTIAL
BKR WAM ABSOLUTE IMMATURE GRANULOCYTES.: 0.04 x 1000/ÂµL (ref 0.00–0.30)
BKR WAM ABSOLUTE LYMPHOCYTE COUNT.: 1.27 x 1000/ÂµL (ref 0.60–3.70)
BKR WAM ABSOLUTE NRBC (2 DEC): 0 x 1000/ÂµL (ref 0.00–1.00)
BKR WAM ANALYZER ANC: 3.8 x 1000/ÂµL (ref 2.00–7.60)
BKR WAM BASOPHIL ABSOLUTE COUNT.: 0.06 x 1000/ÂµL (ref 0.00–1.00)
BKR WAM BASOPHILS: 1 % (ref 0.0–1.4)
BKR WAM EOSINOPHIL ABSOLUTE COUNT.: 0.41 x 1000/ÂµL (ref 0.00–1.00)
BKR WAM EOSINOPHILS: 6.7 % — ABNORMAL HIGH (ref 0.0–5.0)
BKR WAM HEMATOCRIT (2 DEC): 32.9 % — ABNORMAL LOW (ref 38.50–50.00)
BKR WAM HEMOGLOBIN: 10.7 g/dL — ABNORMAL LOW (ref 13.2–17.1)
BKR WAM IMMATURE GRANULOCYTES: 0.7 % (ref 0.0–1.0)
BKR WAM LYMPHOCYTES: 20.8 % (ref 17.0–50.0)
BKR WAM MCH (PG): 25 pg — ABNORMAL LOW (ref 27.0–33.0)
BKR WAM MCHC: 32.5 g/dL (ref 31.0–36.0)
BKR WAM MCV: 76.9 fL — ABNORMAL LOW (ref 80.0–100.0)
BKR WAM MONOCYTE ABSOLUTE COUNT.: 0.53 x 1000/ÂµL (ref 0.00–1.00)
BKR WAM MONOCYTES: 8.7 % (ref 4.0–12.0)
BKR WAM MPV: 8 fL (ref 8.0–12.0)
BKR WAM NEUTROPHILS: 62.1 % (ref 39.0–72.0)
BKR WAM NUCLEATED RED BLOOD CELLS: 0 % (ref 0.0–1.0)
BKR WAM PLATELETS: 330 x1000/ÂµL (ref 150–420)
BKR WAM RDW-CV: 14.6 % (ref 11.0–15.0)
BKR WAM RED BLOOD CELL COUNT.: 4.28 M/ÂµL (ref 4.00–6.00)
BKR WAM WHITE BLOOD CELL COUNT: 6.1 x1000/ÂµL (ref 4.0–11.0)

## 2021-10-17 MED ORDER — VANCOMYCIN MAR LEVEL
Freq: Once | INTRAVENOUS | Status: CP
Start: 2021-10-17 — End: ?

## 2021-10-17 NOTE — Other
VANCOMYCIN DAILY MONITORINGDay of Therapy: 4Vancomycin Indication: S. aureus in multiple Cx, sensitivities not yet reportedWhen is vancomycin level indicated? 4/28 at 1400Opportunity for antimicrobial de-escalation? Not at this timeRenal Function: StableLab Results Component Value Date/Time  CREATININE 0.80 10/17/2021 11:19 AM  CREATININE 0.80 10/15/2021 12:12 PM  CREATININE 1.00 10/14/2021 07:41 PM  CREATININE 1.10 10/14/2021 04:28 PM

## 2021-10-17 NOTE — Plan of Care
Problem: Adult Inpatient Plan of CareGoal: Plan of Care ReviewOutcome: Interventions implemented as appropriate Plan of Care Overview/ Patient Status    Patient is AxOx4, VSS, afebrile, RA. HOH. Meds and nursing care explained, patient verbalized understanding. IV abx give per [facility]. No complaints of pain at this time. Patient Ax1 with RW, NWB to LLE. Continent of both, OOB to toilet. Urinal at bedside. Last BM 4/26. Independent with TxP. Dressings to BLE CDI, changes by podiatry. Skin tear to R forearm with foam, CDI. Sunburn to L hand, OTA, eucerin cream at bedside. Safety measures maintained. Call bell within reach. Bed alarm on. See flowsheets for complete assessment. Continue to monitor. Jeannie Fend, RN

## 2021-10-17 NOTE — Other
Medicine Consult Note Hospital Day: 2SubjectiveUnderwent left TMA yesterday for acute on chronic OMIn good spirits and very happy with the care he is getting here.States has not been taking statin due to joint pains from lipitor, however doesn't think he tried crestor before.  ObjectiveVitals:  10/16/21 1703 BP: 128/77 Pulse: 73 Resp: 20 Temp: 98.8 ?F (37.1 ?C)  Exam: Gen: Talkative well appearing gentleman, conversational NADCV: RRR, no murmur or gallopPulm: No increased WOB, breath sounds clear to auscultation bilaterallyLE: No edema in ankles, feet wrapped bilaterallySkin: Bilateral dorsal hand erythema and scabbing Labs notable for:WBC: 8.3Hgb: 10.5<-11.1Vit B12 217Iron studies:  Iron 30, TIBC 199, iron sat 15%, ferritin 126 Micro:4/24 MRSA negative4/24 deep wound culture 1+ PsA, 2+ GPCs in pairs4/25 deep wound cultures: no organisms seenImaging:XR foot left 7pm 4/15FINDINGS:?Patient is status post interval transmetatarsal amputation at the level of the mid shaft.Mild degenerative changes are noted in the included midfoot with small ossicles at the dorsal aspect of the talonavicular joint, in keeping with a sequel of prior injury.?Soft tissue swelling and air is in keeping with postsurgical changes. Assessment & Plan68 y.o. 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 Medicine is consulted for co-management. # foot wounds, s/p L TMA- Abx per podiatry	- Vanc/Zosyn 4/24 - present- Pain control per podiatry- Miralax standing- Resume Lovenox 40 QD for vte ppx#microcytic to normocytic anemiaFound to have B12 deficiency. Iron studies with low iron sat and ferritin lower than expected given ongoing infection, so also likely has iron deficiency- start B12 PO supplementation 1000mg  daily- hold off on iron supplementation while in hospital during acute infection, can prescribe on discharge- CBC daily#T2DM- increase to mid dose SSI qACHS#CADS/p CABG Jan 2022.  Reports recovering well. Remote stents, unclear why full ASA as opposed to DAPT.- ASA 81 mg QD for now- metoprolol tartrate 25mg  BID- Start rosuvastatin 10mg  qd to target LDL < 70 for secondary prevention#Sunburn - photosensitivity iso doxycycline- Lubriderm ordered for bilateral hands#Depression- Lexapro 10mg QDDiscussed with attending please await addendumJennifer Renaldo Reel, MDPGY-2, Internal Medicine -Primary CareYale-Drexel Hill HospitalBest Contact: MHB

## 2021-10-17 NOTE — Plan of Care
Plan of Care Overview/ Patient Status    Problem: Adult Inpatient Plan of CareGoal: Plan of Care ReviewOutcome: Interventions implemented as appropriate Problem: Fall Injury RiskGoal: Absence of Fall and Fall-Related InjuryOutcome: Interventions implemented as appropriate Problem: Impaired Wound HealingGoal: Optimal Wound HealingOutcome: Interventions implemented as appropriate Patient is A+O x4. Medication and nursing care explained. Afebrile. VS stable on RA. No complaints of pain. IV abx given. NWB LLE, Ax1 w/RW, T+P independently. Continent of B+B, urinal at bedside. Last BM 4/27. Dressing changes to BLE done by podiatry C/D/I. Skin tear to R forearm, and sunburn to L hand eucerin cream applied. Safety maintained and continue to monitor. See flow sheet for complete assessment.  Dorena Dew, RN

## 2021-10-17 NOTE — Plan of Care
Inpatient Physical Therapy Progress Note IP Adult PT Eval/Treat - 10/17/21 1135    Date of Visit / Treatment  Date of Visit / Treatment 10/17/21   Note Type Progress Note   Start Time 1110   End Time 1135   Total Treatment Time 25    General Information  Subjective Great let's get up I have to use the bathroom   General Observations Patient sitting in chair, RA, NAD, PIV   Precautions/Limitations Fall Precautions   Precautions/Limitations Comment NWB L LE    Weight Bearing Status  Weight Bearing Status Comments NWB L LE    Vital Signs and Orthostatic Vital Signs  Vital Signs Free text RA, NAD    Pain/Comfort  Pain Comment (Pre/Post Treatment Pain) Patient denied pain    Cognition  Overall Cognitive Status WFL    Skin Assessment  Skin Assessment See Nursing Documentation    Balance  Sitting Balance: Static  FAIR+?????Maintains static position without assist or device, may require Supervision or?Verbal Cues (>2 minutes)   Sitting Balance: Dynamic  FAIR+????Performs dynamic activities through full range with Supervision   Standing Balance: Static FAIR+?????Maintains static position without assist or device, may require Supervision or?Verbal Cues (>2 minutes)   Standing Balance: Dynamic  FAIR-?????Performs dynamic activities through partial range (50-75%) with Magazine features editor  Sit-to-Stand Transfer Independence/Assistance Level Contact guard;Verbal cues   Sit-to-Stand Transfer Assist Device Rolling walker   Stand-to-Sit Transfer Independence/Assistance Level Contact guard;Verbal cues   Stand-to-Sit Transfer Assist Device Rolling walker   Transfer Safety Analysis Impairments impaired balance;decreased strength    Toilet Transfer Training  Toilet Transfer Independence/Assistance Level Level Contact guard;Verbal cues   Toilet Transfer Assist Device Rolling walker;Grab bars   Toilet Transfer Comments Patient had improved performance of transferring to and from toilet, aware of holding on to grab bar for assist. Assistance with pulling up/down underwear.    Gait Training  Independence/Assistance Level  Contact guard;Verbal cues   Assistive Device  Rolling walker   Gait Distance 20 feet;x2   Gait Analysis Impairments impaired balance;decreased strength   Gait Training Comments Cuing to avoid stepping too close to frame of walker.    Handoff Documentation  Handoff Patient in chair;Chair alarm;Pressure relief cushion;Patient instructed to call nursing for mobility;Discussed with nursing    PT- AM-PAC - Basic Mobility Screen- How much help from another person do you currently need.....  Turning from your back to your side while in a a flat bed without using rails? 4 - None - Does not require any help and does the activity independently. Can use assistive devices.   Moving from lying on your back to sitting on the side of a flat bed without using bed rails? 4 - None - Does not require any help and does the activity independently. Can use assistive devices.   Moving to and from a bed to a chair (including a wheelchair)? 3 - A Little - Requires a little help (supervision, minimal assistance). Can use assistive devices.   Standing up from a chair using your arms(e.g., wheelchair or bedside chair)? 3 - A Little - Requires a little help (supervision, minimal assistance). Can use assistive devices.   To walk in a hospital room? 3 - A Little - Requires a little help (supervision, minimal assistance). Can use assistive devices.   Climbing 3-5 steps with a railing? 2 - A Lot - Requires a lot of help (maximum to moderate assistance).  Can use assistive devices.   AMPAC Mobility Score 19   TARGET Highest Level of Mobility Mobility Level 6, Walk 10+steps   ACTUAL Highest Level of Mobility Mobility Level 6, Walk 10+ steps    Clinical Impression  Follow up Assessment Patient had good tolerance to PT today. Demonstrated improved performance of toilet transferring. Able to ambulate 20 feet x 2 with RW and CGA, requiring cuing to avoid stepping too close to frame of walker. Patient able to self correct following cues. Patient limited by decreased strength and endurance, and impaired balance. Patient to DC to home with family support and home PT.   Criteria for Skilled Therapeutic Interventions Met yes   Rehab Potential good, to achieve stated therapy goals    Patient/Family Stated Goals  Patient/Family Stated Goal(s) return home    Frequency/Equipment Recommendations  PT Frequency 2x per week   What day of week is next treatment expected? Monday   PT/PTA completing this assessment Tori   Equipment Needs During Admission/Treatment Rolling walker    PT Recommendations for Inpatient Admission  Activity/Level of Assist ambulate;contact guard;assist of 1;with rolling walker;in room    Planned Treatment / Interventions  Education Treatment / Interventions Patient Education / Training   role of PT, POC, goals   PT Discharge Summary  Physical Therapy Disposition Recommendation Home   Additional Physical Therapy Disposition Recommendations Home with family support;Home Physical Therapy   Equipment Recommendations for Discharge Rolling walker;Wheelchair;Other (comment)   Patient refusing wheelchair, states he has one at home.    [address] SPTTori [place], [place]: [phone]

## 2021-10-18 LAB — CBC WITH AUTO DIFFERENTIAL
BKR WAM ABSOLUTE IMMATURE GRANULOCYTES.: 0.04 x 1000/ÂµL (ref 0.00–0.30)
BKR WAM ABSOLUTE LYMPHOCYTE COUNT.: 1.32 x 1000/ÂµL (ref 0.60–3.70)
BKR WAM ABSOLUTE NRBC (2 DEC): 0 x 1000/ÂµL (ref 0.00–1.00)
BKR WAM ANALYZER ANC: 3.69 x 1000/ÂµL (ref 2.00–7.60)
BKR WAM BASOPHIL ABSOLUTE COUNT.: 0.05 x 1000/ÂµL (ref 0.00–1.00)
BKR WAM BASOPHILS: 0.8 % (ref 0.0–1.4)
BKR WAM EOSINOPHIL ABSOLUTE COUNT.: 0.35 x 1000/ÂµL (ref 0.00–1.00)
BKR WAM EOSINOPHILS: 5.9 % — ABNORMAL HIGH (ref 0.0–5.0)
BKR WAM HEMATOCRIT (2 DEC): 36.2 % — ABNORMAL LOW (ref 38.50–50.00)
BKR WAM HEMOGLOBIN: 11.5 g/dL — ABNORMAL LOW (ref 13.2–17.1)
BKR WAM IMMATURE GRANULOCYTES: 0.7 % (ref 0.0–1.0)
BKR WAM LYMPHOCYTES: 22.2 % (ref 17.0–50.0)
BKR WAM MCH (PG): 24.8 pg — ABNORMAL LOW (ref 27.0–33.0)
BKR WAM MCHC: 31.8 g/dL (ref 31.0–36.0)
BKR WAM MCV: 78 fL — ABNORMAL LOW (ref 80.0–100.0)
BKR WAM MONOCYTE ABSOLUTE COUNT.: 0.49 x 1000/ÂµL (ref 0.00–1.00)
BKR WAM MONOCYTES: 8.2 % (ref 4.0–12.0)
BKR WAM MPV: 8.2 fL (ref 8.0–12.0)
BKR WAM NEUTROPHILS: 62.2 % (ref 39.0–72.0)
BKR WAM NUCLEATED RED BLOOD CELLS: 0 % (ref 0.0–1.0)
BKR WAM PLATELETS: 374 x1000/ÂµL (ref 150–420)
BKR WAM RDW-CV: 14.6 % (ref 11.0–15.0)
BKR WAM RED BLOOD CELL COUNT.: 4.64 M/ÂµL (ref 4.00–6.00)
BKR WAM WHITE BLOOD CELL COUNT: 5.9 x1000/ÂµL (ref 4.0–11.0)

## 2021-10-18 LAB — BASIC METABOLIC PANEL
BKR ANION GAP: 13 (ref 7–17)
BKR BLOOD UREA NITROGEN: 10 mg/dL (ref 8–23)
BKR BUN / CREAT RATIO: 12.5 (ref 8.0–23.0)
BKR CALCIUM: 10 mg/dL (ref 8.8–10.2)
BKR CHLORIDE: 105 mmol/L (ref 98–107)
BKR CO2: 20 mmol/L (ref 20–30)
BKR CREATININE: 0.8 mg/dL (ref 0.40–1.30)
BKR EGFR, CREATININE (CKD-EPI 2021): >60 mL/min/{1.73_m2} (ref >=60)
BKR GLUCOSE: 143 mg/dL — ABNORMAL HIGH (ref 70–100)
BKR POTASSIUM: 4.4 mmol/L (ref 3.3–5.3)
BKR SODIUM: 138 mmol/L (ref 136–144)

## 2021-10-18 LAB — MAGNESIUM: BKR MAGNESIUM: 1.7 mg/dL (ref 1.7–2.4)

## 2021-10-18 LAB — VANCOMYCIN, TROUGH: BKR VANCOMYCIN TROUGH: 17.7 ug/mL — ABNORMAL HIGH (ref 10.0–15.0)

## 2021-10-18 MED ORDER — MAGNESIUM SULFATE 2 GRAM/50 ML (4 %) IN WATER INTRAVENOUS PIGGYBACK
2 gram/50 mL (4 %) | Freq: Once | INTRAVENOUS | Status: CP
Start: 2021-10-18 — End: ?
  Administered 2021-10-18: 21:00:00 2 mL/h via INTRAVENOUS

## 2021-10-18 NOTE — Plan of Care
Problem: Adult Inpatient Plan of CareGoal: Plan of Care ReviewOutcome: Interventions implemented as appropriate Plan of Care Overview/ Patient Status    Patient is AxOx4, VSS, afebrile, RA. HOH. Contact precautions initiated this shift d/t wound culture.Meds and nursing care explained, patient verbalized understanding. IV abx give per [facility]. No complaints of pain at this time. Patient Ax1 with RW, NWB to LLE. Continent of both, OOB to toilet. Urinal at bedside. Last BM 4/28. Independent with TxP. Dressings to BLE CDI, changes by podiatry. Skin tear to R forearm OTA. Sunburn to L hand, OTA, eucerin cream at bedside. Safety measures maintained. Call bell within reach. Bed alarm on. See flowsheets for complete assessment. Continue to monitor. Jeannie Fend, RN

## 2021-10-18 NOTE — Progress Notes
[facility]	Podiatry Progress Note4/27/2023Subjective: No events overnight. Pt is in agreeable manner. Denies pain to the left LE. Denies f/n/v/cp/sob.S/p left foot transmetatarsal amputation, partially OPEN (DOS: 10/15/2021).Objective: VitalsTemp:  [98 ?F (36.7 ?C)-99.3 ?F (37.4 ?C)] 98.2 ?F (36.8 ?C)Pulse:  [66-74] 69Resp:  [18-20] 19BP: (108-144)/(68-77) 137/75SpO2:  [94 %-99 %] 98 %Gen: Alert, no distressLeft foot: Central surgical wound with exposed bone.  No purulence on manual expression.  Hemostatic.  Surrounding erythema and edema consistent with postoperative course.  Sutures intact.  Labs: Recent Labs   04/24/231941 04/24/232241 04/25/230941 04/25/231212 04/26/230820 04/26/231134 04/27/230733 04/27/230800 04/27/231119 04/27/231206 WBC  --   --  8.3  --  7.3  --  6.1  --   --   --  HGB  --    < > 11.1*  --  10.5*  --  10.7*  --   --   --  HCT  --    < > 35.40*  --  34.10*  --  32.90*  --   --   --  CREATININE 1.00  --   --    < >  --   --   --   --  0.80  --  BUN 19  --   --    < >  --   --   --   --  10  --  CO2 20  --   --    < >  --   --   --   --  21  --  HSCRP 169.8*  --   --   --   --   --   --   --   --   --  SEDRATE  --   --  66*  --   --   --   --   --   --   --  PLT  --    < > 296  --  300  --  330  --   --   --  GLU 130*   < >  --    < >  --    < >  --    < > 197* 157* K 4.4  --   --    < >  --   --   --   --  4.5  --  NA 139  --   --    < >  --   --   --   --  139  --  CL 107  --   --    < >  --   --   --   --  106  --   < > = values in this interval not displayed. Imaging: XR Left Foot: 10/15/2021 - FINDINGS: Patient is status post interval transmetatarsal amputation at the level of the mid shaft.Mild degenerative changes are noted in the included midfoot with small ossicles at the dorsal aspect of the talonavicular joint, in keeping with a sequel of prior injury. Soft tissue swelling and air is in keeping with postsurgical changes. IMPRESSION: Post surgical changes, as above.Micro/Path: Micro: 10/15/2021 - prelim1. Left 2nd toe wound2. Left 3rd toe bone3. Left foot tissue-Pseudomonas aeruginosa4. Left 1st metatarsal clean margin5. Left 2nd metatarsal clean margin6. Left 3rd metatarsal clean margin7. Left 4th metatarsal clean margin-staph aureus8. Left 5th metatarsal clean margin- staph aureus Path: 10/15/2021 - pending1. Left 2nd toe wound2. Left 3rd toe bone3. Left foot tissue4. Left 1st metatarsal clean margin5.  Left 2nd metatarsal clean margin6. Left 3rd metatarsal clean margin7. Left 4th metatarsal clean margin8. Left 5th metatarsal clean margin9. Left TMAAssessment/Plan: 71 y.o. male with S/p left foot transmetatarsal amputation, partially OPEN (DOS: 10/15/2021).  - Dressings changed dakins, DSD- Podiatry will perform dressing changes as indicated- Nursing please reinforce dressing PRN with ABDs, 4x4 gauze, kerlix, ACE Abx- vanc 10/14/2021-present-zosyn 10/14/2021-present Consult- Medicine: for medical comanagement and pre-op risk stratification- VS: for perfusion assessment; perfusion deemed to be adequate vascular signed off Diet: CCActivity: NWB to LLE, WBAT to RLEDVT Ppx: lovenoxCode: Full Dispo:  Pending micro and path results and clinical improvement likely stage procedure this admission Will discuss with attending Dr. Matthias Hughs Please page on-call podiatry resident with questions:SEE AMION FOR CALL SCHEDULEElectronically Signed by Budd Palmer, DPM, April 27, 2023AddendumReturn to patient's room with attending to discuss overall treatment plan.  At this time patient is growing bacteria on his clean margin and will require further surgical intervention.Patient will be booked for this coming Tuesday afternoonSeen with attending Dr.D'AngeloUzair [name] Page, DPM PGY-2 [facility] Podiatric SurgeryElectronically Signed by Donne Hazel, DPM, April 27, 2023I saw and evaluated Edwin [name] with the resident. I agree with the findings and the plan of care as documented in the resident note.Electronically Signed by Paulene Floor, DPM, 10/17/2021

## 2021-10-18 NOTE — Progress Notes
[facility]	Podiatry Progress Note4/27/2023Subjective: No events overnight. Pt is in agreeable manner. Denies pain to the left LE. Denies f/n/v/cp/sob.S/p left foot transmetatarsal amputation, partially OPEN (DOS: 10/15/2021).Objective: VitalsTemp:  [98.1 ?F (36.7 ?C)-99.3 ?F (37.4 ?C)] 98.1 ?F (36.7 ?C)Pulse:  [66-74] 72Resp:  [18-20] 18BP: (124-144)/(69-77) 132/76SpO2:  [94 %-99 %] 97 %Gen: Alert, no distressLeft foot: Central surgical wound with exposed bone.  No purulence on manual expression.  Hemostatic.  Surrounding erythema and edema consistent with postoperative course.  Sutures intact.  Labs: Recent Labs   04/24/231628 04/24/231941 04/24/232241 04/25/230941 04/25/231212 04/25/231225 04/26/230820 04/26/231134 04/27/230733 04/27/230800 WBC 11.2*  --   --  8.3  --   --  7.3  --  6.1  --  HGB 10.7*  --   --  11.1*  --   --  10.5*  --  10.7*  --  HCT 33.40*  --   --  35.40*  --   --  34.10*  --  32.90*  --  CREATININE 1.10 1.00  --   --  0.80  --   --   --   --   --  BUN 18 19  --   --  11  --   --   --   --   --  CO2 25 20  --   --  18*  --   --   --   --   --  HSCRP 182.5* 169.8*  --   --   --   --   --   --   --   --  SEDRATE 49*  --   --  66*  --   --   --   --   --   --  PLT 290  --   --  296  --   --  300  --  330  --  INR 1.06  --   --   --   --   --   --   --   --   --  GLU 135* 130*   < >  --  106*   < >  --    < >  --  129* K 4.5 4.4  --   --  4.4  --   --   --   --   --  NA 143 139  --   --  137  --   --   --   --   --  CL 106 107  --   --  107  --   --   --   --   --   < > = values in this interval not displayed. Imaging: XR Left Foot: 10/15/2021 - FINDINGS: Patient is status post interval transmetatarsal amputation at the level of the mid shaft.Mild degenerative changes are noted in the included midfoot with small ossicles at the dorsal aspect of the talonavicular joint, in keeping with a sequel of prior injury. Soft tissue swelling and air is in keeping with postsurgical changes. IMPRESSION: Post surgical changes, as above.Micro/Path: Micro: 10/15/2021 - prelim1. Left 2nd toe wound2. Left 3rd toe bone3. Left foot tissue-Pseudomonas aeruginosa4. Left 1st metatarsal clean margin5. Left 2nd metatarsal clean margin6. Left 3rd metatarsal clean margin7. Left 4th metatarsal clean margin-staph aureus8. Left 5th metatarsal clean margin- staph aureus Path: 10/15/2021 - pending1. Left 2nd toe wound2. Left 3rd toe bone3. Left foot tissue4. Left 1st metatarsal clean margin5. Left 2nd metatarsal clean margin6. Left 3rd  metatarsal clean margin7. Left 4th metatarsal clean margin8. Left 5th metatarsal clean margin9. Left TMAAssessment/Plan: 71 y.o. male with S/p left foot transmetatarsal amputation, partially OPEN (DOS: 10/15/2021).  - Dressings changed dakins, DSD- Podiatry will perform dressing changes as indicated- Nursing please reinforce dressing PRN with ABDs, 4x4 gauze, kerlix, ACE Abx- vanc 10/14/2021-present-zosyn 10/14/2021-present Consult- Medicine: for medical comanagement and pre-op risk stratification- VS: for perfusion assessment; perfusion deemed to be adequate vascular signed off Diet: CCActivity: NWB to LLE, WBAT to RLEDVT Ppx: lovenoxCode: Full Dispo:  Pending micro and path results and clinical improvement likely stage procedure this admission Will discuss with attending Dr. Matthias Hughs Please page on-call podiatry resident with questions:SEE AMION FOR CALL SCHEDULEElectronically Signed by Budd Palmer, DPM, April 27, 2023I saw and evaluated Edwin [name] with the resident. I agree with the findings and the plan of care as documented in the resident note. Discussed that he will need further metatarsal resections, flap closure, and possible tendo Achilles lengthening once he has had ample time for the infection to continue to drain. Patient verbalized understanding and is willing to proceed with this plan.Electronically Signed by Paulene Floor, DPM, 10/17/2021

## 2021-10-18 NOTE — Plan of Care
Plan of Care Overview/ Patient Status    Problem: Adult Inpatient Plan of CareGoal: Plan of Care ReviewOutcome: Interventions implemented as appropriatePatient is A+O times 4, pleasant. Meds and nursing care explained, patient verbalized understanding. Afebrile, vital signs stable. No complaints of pain. Patient on room air. Assist x 1 RW in room, up to toilet. Last BM today. NWB to LLE. Dressing to lower extremities done by podiatry. Skin tear to forearm OTA. Plan for staged procedure 5/2. IV abx given as scheduled. Safety and contact measures maintained. T&P self. See doc flow sheet for complete assessment. Continue to monitor. Pietro Cassis, RN

## 2021-10-18 NOTE — Other
VANCOMYCIN LEVEL EVALUATIONCurrent Vancomycin Order: 1.25 g IV every 12 hours. Day of Therapy: 5Vancomycin Indication: S. aureus in multiple Cx, sensitivities not yet reportedEstimated Creatinine Clearance: 93 mL/min (by C-G formula based on SCr of 0.8 mg/dL).Creatinine (mg/dL) Date Value 40/98/1191 0.80 10/17/2021 0.80 10/15/2021 0.80  Renal Function: StableLevel: Lab Results Last 72 Hours Component Value Date/Time  Vancomycin Trough 17.7 (H) 10/18/2021 01:32 PM Type of Level: Trough; Level drawn appropriately? Yes, slightly early at 10 hrs post last doseBased on vancomycin level obtained, recommend:  Continue current regimen, should be within 10-15 range if was collected a bit later. Collect another level in 3 days or sooner if significant renal function changeRepeat Level:  5/1 , not ordered yetID/AST Consulted? NoYNHH/LMH/WH Vancomycin Dosing GuidelineBH Vancomycin Dosing GuidelineFor questions, please contact the pharmacist: Marikay Alar, PharmD    Phone/Mobile Heartbeat: MHB

## 2021-10-18 NOTE — Other
Medicine Consult Note Hospital Day: 3SubjectiveFeels well, no muscle or joint aches. Says in the past with lipitor it took a week or so to develop such symptoms. Has good appetite ObjectiveVitals:  10/17/21 2000 BP: 126/71 Pulse: 71 Resp: 18 Temp: 98.7 ?F (37.1 ?C)  Exam: Gen: Talkative well appearing gentleman sitting up in chair, conversational NADCV: RRR, no murmur or gallopPulm: No increased WOB, crackles in bilateral basesLE: No edema in ankles, feet wrapped bilaterally Skin: Bilateral dorsal hand erythema improving Labs notable for:WBC: 6.1<- 7.3Hgb: 10.7Vit B12 217Iron studies:  Iron 30, TIBC 199, iron sat 15%, ferritin 126 Micro:4/24 MRSA negative4/24 deep wound culture 1+ PsA, 2+ GPCs in pairs4/25 deep wound cultures: no organisms seenImaging:XR foot left 7pm 4/15FINDINGS:?Patient is status post interval transmetatarsal amputation at the level of the mid shaft.Mild degenerative changes are noted in the included midfoot with small ossicles at the dorsal aspect of the talonavicular joint, in keeping with a sequel of prior injury.?Soft tissue swelling and air is in keeping with postsurgical changes. Assessment & Plan36 y.o. 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 Pending another procedure in a few days. Medicine is consulted for co-management. # foot wounds, s/p L TMA- Abx per podiatry	- Vanc/Zosyn 4/24 - present- Pain control per podiatry- Miralax standing- Lovenox 40 QD for vte ppx#microcytic to normocytic anemiaFound to have B12 deficiency. Iron studies with low iron sat and ferritin lower than expected given ongoing infection, so also likely has iron deficiency- cont B12 PO supplementation 1000mg  daily- hold off on iron supplementation while in hospital during acute infection, can prescribe on discharge- CBC daily#T2DM - A1c 6.8 06/2021- cont mid dose SSI qACHS#CADS/p CABG Jan 2022.  Reports recovering well. Remote stents, unclear why full ASA as opposed to DAPT.- ASA 81 mg QD for now- metoprolol tartrate 25mg  BID- cont rosuvastatin 10mg  qd to target LDL < 70 for secondary prevention, will monitor for myalgias#Sunburn - photosensitivity iso doxycycline- Lubriderm ordered for bilateral hands#Depression- Lexapro 10mg QDDiscussed with attending please await addendumJennifer Renaldo Reel, MDPGY-2, Internal Medicine -Primary CareYale-Kiester HospitalBest Contact: MHB

## 2021-10-19 LAB — CBC WITH AUTO DIFFERENTIAL
BKR WAM ABSOLUTE IMMATURE GRANULOCYTES.: 0.06 x 1000/ÂµL (ref 0.00–0.30)
BKR WAM ABSOLUTE LYMPHOCYTE COUNT.: 1.75 x 1000/ÂµL (ref 0.60–3.70)
BKR WAM ABSOLUTE NRBC (2 DEC): 0 x 1000/ÂµL (ref 0.00–1.00)
BKR WAM ANALYZER ANC: 4.14 x 1000/ÂµL (ref 2.00–7.60)
BKR WAM BASOPHIL ABSOLUTE COUNT.: 0.06 x 1000/ÂµL (ref 0.00–1.00)
BKR WAM BASOPHILS: 0.8 % (ref 0.0–1.4)
BKR WAM EOSINOPHIL ABSOLUTE COUNT.: 0.59 x 1000/ÂµL (ref 0.00–1.00)
BKR WAM EOSINOPHILS: 8.1 % — ABNORMAL HIGH (ref 0.0–5.0)
BKR WAM HEMATOCRIT (2 DEC): 35 % — ABNORMAL LOW (ref 38.50–50.00)
BKR WAM HEMOGLOBIN: 11.3 g/dL — ABNORMAL LOW (ref 13.2–17.1)
BKR WAM IMMATURE GRANULOCYTES: 0.8 % (ref 0.0–1.0)
BKR WAM LYMPHOCYTES: 24.1 % (ref 17.0–50.0)
BKR WAM MCH (PG): 25.2 pg — ABNORMAL LOW (ref 27.0–33.0)
BKR WAM MCHC: 32.3 g/dL (ref 31.0–36.0)
BKR WAM MCV: 78.1 fL — ABNORMAL LOW (ref 80.0–100.0)
BKR WAM MONOCYTE ABSOLUTE COUNT.: 0.67 x 1000/ÂµL (ref 0.00–1.00)
BKR WAM MONOCYTES: 9.2 % (ref 4.0–12.0)
BKR WAM MPV: 7.9 fL — ABNORMAL LOW (ref 8.0–12.0)
BKR WAM NEUTROPHILS: 57 % (ref 39.0–72.0)
BKR WAM NUCLEATED RED BLOOD CELLS: 0 % (ref 0.0–1.0)
BKR WAM PLATELETS: 350 x1000/ÂµL (ref 150–420)
BKR WAM RDW-CV: 14.5 % (ref 11.0–15.0)
BKR WAM RED BLOOD CELL COUNT.: 4.48 M/ÂµL (ref 4.00–6.00)
BKR WAM WHITE BLOOD CELL COUNT: 7.3 x1000/ÂµL (ref 4.0–11.0)

## 2021-10-19 LAB — DEEP WOUND CULTURE   (BH GH LMW YH)

## 2021-10-19 LAB — BASIC METABOLIC PANEL
BKR ANION GAP: 10 (ref 7–17)
BKR BLOOD UREA NITROGEN: 11 mg/dL (ref 8–23)
BKR BUN / CREAT RATIO: 13.8 (ref 8.0–23.0)
BKR CALCIUM: 9.6 mg/dL (ref 8.8–10.2)
BKR CHLORIDE: 107 mmol/L (ref 98–107)
BKR CO2: 20 mmol/L (ref 20–30)
BKR CREATININE: 0.8 mg/dL (ref 0.40–1.30)
BKR EGFR, CREATININE (CKD-EPI 2021): >60 mL/min/{1.73_m2} (ref >=60)
BKR GLUCOSE: 154 mg/dL — ABNORMAL HIGH (ref 70–100)
BKR POTASSIUM: 4.1 mmol/L (ref 3.3–5.3)
BKR SODIUM: 137 mmol/L (ref 136–144)

## 2021-10-19 LAB — MAGNESIUM: BKR MAGNESIUM: 1.8 mg/dL (ref 1.7–2.4)

## 2021-10-19 NOTE — Other
Medicine Consult Note Hospital Day: 4SubjectiveFeels well, no muscle or joint aches. Continues to have good appetite ObjectiveVitals:  10/18/21 0440 BP: 122/76 Pulse: 79 Resp: 18 Temp: 98.4 ?F (36.9 ?C)  Exam: Gen: Talkative well appearing gentleman sitting up in chair with feet propped up, conversational NADCV: RRR, no murmur or gallopPulm: No increased WOB, CTABLE: No edema in ankles, feet wrapped bilaterally Skin: Bilateral dorsal hand erythema improving Labs notable for:WBC: 5.9 <- 6.1<- 7.3Hgb: 11.5Vit B12 217Iron studies:  Iron 30, TIBC 199, iron sat 15%, ferritin 126 Micro:4/24 MRSA negative4/24 deep wound culture 1+ PsA, 2+ GPCs in pairs4/25 deep wound cultures: no organisms seenImaging:XR foot left 7pm 4/15FINDINGS:?Patient is status post interval transmetatarsal amputation at the level of the mid shaft.Mild degenerative changes are noted in the included midfoot with small ossicles at the dorsal aspect of the talonavicular joint, in keeping with a sequel of prior injury.?Soft tissue swelling and air is in keeping with postsurgical changes. Assessment & Plan18 y.o. 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 Pending another procedure in a few days. Medicine is consulted for co-management. # foot wounds, s/p L TMA- Abx per podiatry	- Vanc/Zosyn 4/24 - present- Pain control per podiatry- Miralax standing- Lovenox 40 QD for vte ppx #microcytic to normocytic anemiaFound to have B12 deficiency. Iron studies with low iron sat and ferritin lower than expected given ongoing infection, so also likely has iron deficiency- cont B12 PO supplementation 1000mg  daily- hold off on iron supplementation while in hospital during acute infection, can prescribe on discharge- CBC daily#T2DM - A1c 6.8 06/2021- cont mid dose SSI qACHS #CADS/p CABG Jan 2022.  Reports recovering well. Remote stents, unclear why full ASA as opposed to DAPT.- ASA 81 mg QD for now- metoprolol tartrate 25mg  BID- cont rosuvastatin 10mg  qd to target LDL < 70 for secondary prevention, will monitor for myalgias#Sunburn - photosensitivity iso doxycycline- Lubriderm ordered for bilateral hands#Depression- Lexapro 10mg QDDiscussed with attending please await addendumJennifer Renaldo Reel, MDPGY-2, Internal Medicine -Primary CareYale-Rivanna HospitalBest Contact: MHB

## 2021-10-19 NOTE — Plan of Care
Plan of Care Overview/ Patient Status    Patient alert and oriented x4. VSS on RA. No complaints of pain other than intermittent nerve pain to BL feet. Dressing to LLE c/d/i.  Dressing changes to BL LE performed by podiatry. LBM 4/28. Ax1 with RW out of bed. NWB to LLE. IV ABX given per MAR. Turn and reposition indpependent in bed. Call bell in reach - able to verbalize needs. Fall, safety, and contact precautions maintained. Tomi Likens, RN 3:29 AM 10/19/2021

## 2021-10-19 NOTE — Other
Medicine Consult Note Hospital Day: 5SubjectiveFeels well, no muscle or joint aches. Continues to have good appetite ObjectiveVitals:  10/19/21 0815 BP: 126/80 Pulse: 66 Resp: 20 Temp: 98.1 ?F (36.7 ?C)  Exam: Gen: Talkative well appearing gentleman sitting up in chair with feet propped up, conversational NADCV: RRR, no murmur or gallopPulm: No increased WOB, CTABLE: No edema in ankles, feet wrapped bilaterally Skin: Bilateral dorsal hand erythema improving Labs notable for:WBC: 5.9 <- 6.1<- 7.3Hgb: 11.5Vit B12 217Iron studies:  Iron 30, TIBC 199, iron sat 15%, ferritin 126 Micro:4/24 deep wound +MRSA and pansensitive pseudomonasAssessment & Plan76 y.o. year old M with history significant for controlled DM, CAD s/p remote stents and CABG 2022, with prior toe amputations iso neuropathy and likely vascular disease who is s/p L foot debridement of abscess, I&D, and ultimately TMA on 4/25 with plan for closure next week. Medicine is consulted for co-management. # foot wounds, s/p L TMA- Abx per podiatry	- Vanc/Zosyn 4/24 - present- Pain control per podiatry- Lovenox 40 QD for vte ppx #microcytic anemiaFound to have B12 deficiency- cont B12 PO supplementation 1000mg  daily#T2DM - A1c 6.8 06/2021- BG well controlled on ssi#CADS/p CABG Jan 2022.  Reports recovering well- ASA 81 mg QD for now (home 325)- metoprolol tartrate 25mg  BID- cont rosuvastatin 10mg  qd to target LDL < 70 for secondary prevention, will monitor for myalgias#Sunburn - photosensitivity iso doxycycline- Lubriderm ordered for bilateral hands#Depression- Lexapro 10mg QDSigned:Fletcher Anon, MD MPHContact through [facility]

## 2021-10-19 NOTE — Plan of Care
Plan of Care Overview/ Patient Status    Problem: Adult Inpatient Plan of CareGoal: Plan of Care ReviewOutcome: Interventions implemented as appropriatePatient is A+O times 4, pleasant. Meds and nursing care explained, patient verbalized understanding. Afebrile, vital signs stable. No complaints of pain. Patient on room air. Assist x 1 RW in room, up to toilet. Last BM today. NWB to LLE. Dressing to lower extremities done by podiatry. Skin tear to forearm OTA. Plan for staged procedure 5/2. IV abx given as scheduled. Safety and contact measures maintained. T&P self. See doc flow sheet for complete assessment. Continue to monitor. Pietro Cassis, RN

## 2021-10-20 LAB — DEEP WOUND CULTURE   (BH GH LMW YH)
BKR DEEP WOUND CULTURE: 1 — AB
BKR DEEP WOUND CULTURE: 1 — AB
BKR DEEP WOUND CULTURE: 2 — AB
BKR DEEP WOUND CULTURE: NO GROWTH
BKR DEEP WOUND CULTURE: NO GROWTH
BKR DEEP WOUND CULTURE: NO GROWTH
BKR GRAM STAIN (ROUTINE): NONE SEEN
BKR GRAM STAIN (ROUTINE): NONE SEEN
BKR GRAM STAIN (ROUTINE): NONE SEEN
BKR GRAM STAIN (ROUTINE): NONE SEEN
BKR GRAM STAIN (ROUTINE): NONE SEEN
BKR GRAM STAIN (ROUTINE): NONE SEEN
BKR GRAM STAIN (ROUTINE): NONE SEEN
BKR GRAM STAIN (ROUTINE): NONE SEEN
BKR GRAM STAIN (ROUTINE): NONE SEEN

## 2021-10-20 LAB — BASIC METABOLIC PANEL
BKR ANION GAP: 11 (ref 7–17)
BKR BLOOD UREA NITROGEN: 12 mg/dL (ref 8–23)
BKR BUN / CREAT RATIO: 15 (ref 8.0–23.0)
BKR CALCIUM: 9.6 mg/dL (ref 8.8–10.2)
BKR CHLORIDE: 108 mmol/L — ABNORMAL HIGH (ref 98–107)
BKR CO2: 20 mmol/L (ref 20–30)
BKR CREATININE: 0.8 mg/dL (ref 0.40–1.30)
BKR EGFR, CREATININE (CKD-EPI 2021): >60 mL/min/{1.73_m2} (ref >=60)
BKR GLUCOSE: 148 mg/dL — ABNORMAL HIGH (ref 70–100)
BKR POTASSIUM: 4.4 mmol/L (ref 3.3–5.3)
BKR SODIUM: 139 mmol/L (ref 136–144)

## 2021-10-20 LAB — MAGNESIUM: BKR MAGNESIUM: 1.7 mg/dL (ref 1.7–2.4)

## 2021-10-20 MED ORDER — VANCOMYCIN MAR LEVEL
Freq: Once | INTRAVENOUS | Status: DC
Start: 2021-10-20 — End: 2021-10-21

## 2021-10-20 NOTE — Plan of Care
Plan of Care Overview/ Patient Status    Patient is alert and oriented x4. VSS on RA. No complaints of pain this shift. Numbness to BL LE present as per baseline. PAtient is NWB to LLE. Dressing to LLE C/D/I - dressing changes done by podiatry. IV ABX continued. A X1 with RW oob to bathroom. LBM 4/29. Call bell within reach - able to make needs known. Turn and repositions independent in bed. Fall, contact, and safety precautions maintained. Tomi Likens, RN 2:35 AM 10/20/2021

## 2021-10-20 NOTE — Progress Notes
[facility]	Podiatry Progress Note4/28/2023Subjective: No events overnight. Pt is in agreeable manner. Denies pain to the left LE.  Patient is very appreciative care. Denies f/n/v/cp/sob.S/p left foot transmetatarsal amputation, partially OPEN (DOS: 10/15/2021).Objective: VitalsTemp:  [97.8 ?F (36.6 ?C)-98.7 ?F (37.1 ?C)] 98.5 ?F (36.9 ?C)Pulse:  [66-79] 79Resp:  [18-19] 18BP: (108-137)/(68-78) 130/78SpO2:  [95 %-98 %] 97 %Gen: Alert, no distressLeft foot: Dressings intact with strike-through. Upon removal of dressings, surgical site with exposed bone centrally and deeper soft tissue. No oozing noted from medullary canals of bones.  Mild oozing noted from skin edges.  No active bleeders appreciated.  Oozing noted to stop, with compression. No evidence of necrosis. No purulence expressed with manual exsanguination. Moderate peri-surgical site edema and erythema consistent with post-operative changes. No appreciable soft tissue crepitus or fluctuance. Sutures intactLabs: Recent Labs   04/25/230941 04/25/231212 04/26/230820 04/26/231134 04/27/230733 04/27/230800 04/27/231119 04/27/231206 04/28/230756 WBC 8.3  --  7.3  --  6.1  --   --   --   --  HGB 11.1*  --  10.5*  --  10.7*  --   --   --   --  HCT 35.40*  --  34.10*  --  32.90*  --   --   --   --  CREATININE  --    < >  --   --   --   --  0.80  --   --  BUN  --    < >  --   --   --   --  10  --   --  CO2  --    < >  --   --   --   --  21  --   --  SEDRATE 66*  --   --   --   --   --   --   --   --  PLT 296  --  300  --  330  --   --   --   --  GLU  --    < >  --    < >  --    < > 197*   < > 137* K  --    < >  --   --   --   --  4.5  --   --  NA  --    < >  --   --   --   --  139  --   --  CL  --    < >  --   --   --   --  106  --   --   < > = values in this interval not displayed. Imaging: XR Left Foot: 10/15/2021 - FINDINGS: Patient is status post interval transmetatarsal amputation at the level of the mid shaft.Mild degenerative changes are noted in the included midfoot with small ossicles at the dorsal aspect of the talonavicular joint, in keeping with a sequel of prior injury. Soft tissue swelling and air is in keeping with postsurgical changes. IMPRESSION: Post surgical changes, as above.Micro/Path: Micro: 10/15/2021 -  preliminary1. Left 2nd toe wound -  strep viridans2. Left 3rd toe bone -  strep viridans3. Left foot tissue - Pseudomonas aeruginosa, mixed GP/GN organism4. Left 1st metatarsal clean margin -  NGTD5. Left 2nd metatarsal clean margin - NGTD6. Left 3rd metatarsal clean margin - NGTD7. Left 4th metatarsal clean margin-staph aureus8. Left 5th metatarsal clean margin- staph aureus Path: 10/15/2021 -  pending1. Left 2nd toe wound2. Left 3rd toe bone3. Left foot tissue4. Left 1st metatarsal clean margin5. Left 2nd metatarsal clean margin6. Left 3rd metatarsal clean margin7. Left 4th metatarsal clean margin8. Left 5th metatarsal clean margin9. Left TMAAssessment/Plan: 71 y.o. male with S/p left foot transmetatarsal amputation, partially OPEN (DOS: 10/15/2021).  - Dressings changed dakins, DSD- Podiatry will perform dressing changes as indicated- Nursing please reinforce dressing PRN with ABDs, 4x4 gauze, kerlix, ACE- Plan for OR this coming Tuesday- booking sheet sent,- Discussed surgical options in detail including left foot transmetatarsal amputation. We discussed the proposed procedure in detail, and also discussed potential risks and complications including but not limited to risk of: infection, swelling, numbness, pain discomfort, recurrence of problem,  DVT, PE and death. [name] has agreed to accept these risks, no guarantees were given or implied.  Written informed consent was obtained.  Abx- vanc 10/14/2021-present- zosyn 10/14/2021-present Consult- Medicine: for medical comanagement and pre-op risk stratification- VS: for perfusion assessment; perfusion deemed to be adequate vascular signed off Diet: CCActivity: NWB to LLE, WBAT to RLEDVT Ppx: lovenoxCode: Full Dispo:  Pending micro and path results and clinical improvement likely stage procedure this admission Attending Dr. Matthias Hughs Please page on-call podiatry resident with questions:?	SEE AMION FOR CALL Angela Burke, DPM PGY-2 [place] [facility]iatric SurgeryElectronically Signed by Donne Hazel, DPM, April 28, 2023Discussed with resident, agree with plan. Electronically Signed by Paulene Floor, DPM, October 18, 2021

## 2021-10-20 NOTE — Progress Notes
[facility]	Podiatry Progress Note4/29/2023Subjective: Patient seen at bedside this AM, NAD. NAEO. Discussed micro/path results and plan moving forward to include staged procedure next week on Tuesday.  Denies any pain or n/f/c/vomiting/sob/cp. S/p left foot transmetatarsal amputation, partially OPEN (DOS: 10/15/2021).Objective: VitalsTemp:  [97.4 ?F (36.3 ?C)-98.3 ?F (36.8 ?C)] 98 ?F (36.7 ?C)Pulse:  [66-83] 68Resp:  [18-20] 20BP: (125-157)/(73-83) 127/79SpO2:  [97 %-99 %] 98 %Gen: Alert, no distressLeft foot: Dressings intact with strike-through. Upon removal of dressings, surgical site with exposed bone centrally and deeper soft tissue. No oozing noted from medullary canals of bones.  Mild oozing noted from skin edges.  No active bleeders appreciated.  Oozing noted to stop, with compression. No evidence of necrosis. No purulence expressed with manual exsanguination. Moderate peri-surgical site edema and erythema consistent with post-operative changes. No appreciable soft tissue crepitus or fluctuance. Sutures clean, dry, and intact.Labs: Recent Labs   04/27/230733 04/27/230800 04/28/230913 04/28/231158 04/29/230602 04/29/230826 04/29/231219 WBC 6.1  --  5.9  --  7.3  --   --  HGB 10.7*  --  11.5*  --  11.3*  --   --  HCT 32.90*  --  36.20*  --  35.00*  --   --  CREATININE  --    < > 0.80  --  0.80  --   --  BUN  --    < > 10  --  11  --   --  CO2  --    < > 20  --  20  --   --  PLT 330  --  374  --  350  --   --  GLU  --    < > 143*   < > 154*   < > 156* K  --    < > 4.4  --  4.1  --   --  NA  --    < > 138  --  137  --   --  CL  --    < > 105  --  107  --   --   < > = values in this interval not displayed. Imaging: XR Left Foot: 10/15/2021 FINDINGS: Patient is status post interval transmetatarsal amputation at the level of the mid shaft.Mild degenerative changes are noted in the included midfoot with small ossicles at the dorsal aspect of the talonavicular joint, in keeping with a sequel of prior injury. Soft tissue swelling and air is in keeping with postsurgical changes. IMPRESSION: Post surgical changes, as above.Micro/Path: Micro: 10/15/2021 -  preliminary1. Left 2nd toe wound -  strep viridans2. Left 3rd toe bone -  strep viridans3. Left foot tissue - Pseudomonas aeruginosa, mixed GP/GN organism4. Left 1st metatarsal clean margin -  NGTD5. Left 2nd metatarsal clean margin - NGTD6. Left 3rd metatarsal clean margin - NGTD7. Left 4th metatarsal clean margin- SA8. Left 5th metatarsal clean margin- SA Path: 10/15/2021 - FINAL DIAGNOSIS 1. ?LEFT 2ND TOE BONE, BIOPSY: ? ? ? ?- ACUTE OSTEOMYELITIS 2. ?LEFT 3RD TOE BONE, BIOPSY: ? ? ? ? ? ? - ARTICULAR CARTILAGE AND VIABLE BONE WITH ACUTE OSTEOMYELITIS AND FEATURES OF OSTEOARTHRITIS ? ? ?- VIABLE SOFT TISSUE WITH ACUTE AND CHRONIC INFLAMMATION 3. ?LEFT FOOT TISSUE, DEBRIDEMENT: ? ? ? ? ? ? - BONE WITH ACUTE OSTEOMYELITIS AND OVERLYING ACUTELY INFLAMED SOFT TISSUE 4. ?LEFT 1ST METATARSAL, CLEAN MARGIN: ? ? ? ? ? ? - VIABLE BONE WITH FATTY MARROW AND AGGREGATES OF ACUTE INFLAMMATORY CELLS CONSISTENT WITH FOCAL  ACUTE OSTEOMYELITIS 5. ?LEFT 2ND METATARSAL, CLEAN MARGIN: ? ? ? ? ? ? - VIABLE AND FOCALLY DEVITALIZED BONE WITH FATTY MARROW AND ACUTE INFLAMMATORY CELLS CONSISTENT WITH FOCAL ACUTE OSTEOMYELITIS 6. ?LEFT 3RD METATARSAL CLEAN MARGIN: ? ? ? ? ? ? - VIABLE BONE WITH FATTY MARROW AND SCATTERED ACUTE INFLAMMATORY CELLS BUT NO EVIDENCE OF OSTEOMYELITIS ? ? ?- VIABLE SOFT TISSUE WITHOUT ACUTE INFLAMMATION 7. ?LEFT 4TH METATARSAL, CLEAN MARGIN: ? ? ? ? ? ? - ?VIABLE BONE WITH FATTY MARROW AND NO EVIDENCE OF OSTEOMYELITIS ? ? ?- VIABLE SOFT TISSUE WITHOUT ACUTE INFLAMMATION 8. ?LEFT 5TH METATARSAL, CLEAN MARGIN: ? ? ? ? ? ? - VIABLE SOFT TISSUE WITH PERIOSTEAL REACTIVE BONE FORMATION BUT NO MATURE BONE OR INFLAMMATION  9. ?LEFT FOOT, TRANSMETATARSAL AMPUTATION: ? ? ? ? ? ? - THIRD, FOURTH, AND FIFTH TOES WITH ARTICULAR CARTILAGE AND VIABLE BONE WITH FATTY MARROW, RARE ACUTE INFLAMMATORY CELL, BUT NO EVIDENCE OF OSTEOMYELITIS, AND VIABLE SOFT TISSUE WITH VERY FOCAL ACUTE INFLAMMATION ? ? ? - THREE OF FOUR SEPARATE SPECIMENS EXHIBITING ACUTE OSTEOMYELITIS ? ? ?- DORSAL AND PLANTER SURGICAL MARGINS EXHIBITING ERODED AND ULCERATED SKIN AND UNDERLYING ACUTE INFLAMMATION Assessment/Plan: 71 y.o. male with S/p left foot transmetatarsal amputation, partially OPEN (DOS: 10/15/2021).  - Dressings changed dakins, DSD- Podiatry will perform dressing changes as indicated- Nursing please reinforce dressing PRN with ABDs, 4x4 gauze, kerlix, ACE- Plan for OR on 10/22/21 for Left foot revisional TMA, delayed primary closure versus graft/VAC application - Discussed surgical options in detail including left foot transmetatarsal amputation. We discussed the proposed procedure in detail, and also discussed potential risks and complications including but not limited to risk of: infection, swelling, numbness, pain discomfort, recurrence of problem,  DVT, PE and death. [name] has agreed to accept these risks, no guarantees were given or implied.  Written informed consent was previously obtained.  Abx- vanc 10/14/2021-present- zosyn 10/14/2021-present Consult- Medicine: for medical comanagement and pre-op risk stratification- VS: for perfusion assessment; perfusion deemed to be adequate vascular signed off- Infectious Disease: to be consulted after staged procedure for abx guidance  Diet: CCActivity: NWB to LLE, WBAT to RLEDVT Ppx: lovenoxCode: Full Dispo:  OR Tuesday (10/22/21) for Left foot revisional TMA, delayed primary closure versus graft/VAC application  Will discuss with attending Dr. Matthias Hughs Please page on-call podiatry resident with questions:?	SEE AMION FOR CALL SCHEDULEChristine Ku, DPM PGY-2 [place] Podiatric SurgeryElectronically Signed by Audrie Gallus, DPM, April 29, 2023Discussed with resident, agree with plan. Electronically Signed by Paulene Floor, DPM, October 19, 2021

## 2021-10-20 NOTE — Progress Notes
[facility]	Podiatry Progress Note4/30/2023Subjective: Patient seen at bedside this AM, NAD. NAEO. Discussed micro/path results and plan moving forward to include staged procedure next week on Tuesday.  Denies any pain or n/f/c/vomiting/sob/cp. S/p left foot transmetatarsal amputation, partially OPEN (DOS: 10/15/2021).Objective: VitalsTemp:  [97.4 ?F (36.3 ?C)-98.3 ?F (36.8 ?C)] 97.9 ?F (36.6 ?C)Pulse:  [50-74] 71Resp:  [17-20] 17BP: (102-127)/(61-79) 117/79SpO2:  [95 %-98 %] 98 %Gen: Alert, no distressLeft foot: Dressings intact with strike-through. Upon removal of dressings, surgical site with exposed bone centrally and deeper soft tissue. No oozing noted from medullary canals of bones.  Mild oozing noted from skin edges.  No active bleeders appreciated.  Oozing noted to stop, with compression. No evidence of necrosis. No purulence expressed with manual exsanguination. Moderate peri-surgical site edema and erythema consistent with post-operative changes. No appreciable soft tissue crepitus or fluctuance. Sutures clean, dry, and intact.Labs: Recent Labs   04/28/230913 04/28/231158 04/29/230602 04/29/230826 04/30/230720 04/30/230828 WBC 5.9  --  7.3  --   --   --  HGB 11.5*  --  11.3*  --   --   --  HCT 36.20*  --  35.00*  --   --   --  CREATININE 0.80  --  0.80  --  0.80  --  BUN 10  --  11  --  12  --  CO2 20  --  20  --  20  --  PLT 374  --  350  --   --   --  GLU 143*   < > 154*   < > 148* 148* K 4.4  --  4.1  --  4.4  --  NA 138  --  137  --  139  --  CL 105  --  107  --  108*  --   < > = values in this interval not displayed. Imaging: XR Left Foot: 10/15/2021 FINDINGS:Patient is status post interval transmetatarsal amputation at the level of the mid shaft.Mild degenerative changes are noted in the included midfoot with small ossicles at the dorsal aspect of the talonavicular joint, in keeping with a sequel of prior injury.Soft tissue swelling and air is in keeping with postsurgical changes.IMPRESSION: Post surgical changes, as above.Micro/Path: Micro: 10/15/2021 -  preliminary1. Left 2nd toe wound -  strep viridans2. Left 3rd toe bone -  strep viridans3. Left foot tissue - Pseudomonas aeruginosa, mixed GP/GN organism4. Left 1st metatarsal clean margin -  NGTD5. Left 2nd metatarsal clean margin - NGTD6. Left 3rd metatarsal clean margin - NGTD7. Left 4th metatarsal clean margin- SA8. Left 5th metatarsal clean margin- SA Path: 10/15/2021 - FINAL DIAGNOSIS 1.  LEFT 2ND TOE BONE, BIOPSY:        - ACUTE OSTEOMYELITIS 2.  LEFT 3RD TOE BONE, BIOPSY:             - ARTICULAR CARTILAGE AND VIABLE BONE WITH ACUTE OSTEOMYELITIS AND FEATURES OF OSTEOARTHRITIS      - VIABLE SOFT TISSUE WITH ACUTE AND CHRONIC INFLAMMATION 3.  LEFT FOOT TISSUE, DEBRIDEMENT:             - BONE WITH ACUTE OSTEOMYELITIS AND OVERLYING ACUTELY INFLAMED SOFT TISSUE 4.  LEFT 1ST METATARSAL, CLEAN MARGIN:             - VIABLE BONE WITH FATTY MARROW AND AGGREGATES OF ACUTE INFLAMMATORY CELLS CONSISTENT WITH FOCAL ACUTE OSTEOMYELITIS 5.  LEFT 2ND METATARSAL, CLEAN MARGIN:             -  VIABLE AND FOCALLY DEVITALIZED BONE WITH FATTY MARROW AND ACUTE INFLAMMATORY CELLS CONSISTENT WITH FOCAL ACUTE OSTEOMYELITIS 6.  LEFT 3RD METATARSAL CLEAN MARGIN:             - VIABLE BONE WITH FATTY MARROW AND SCATTERED ACUTE INFLAMMATORY CELLS BUT NO EVIDENCE OF OSTEOMYELITIS      - VIABLE SOFT TISSUE WITHOUT ACUTE INFLAMMATION 7.  LEFT 4TH METATARSAL, CLEAN MARGIN:             -  VIABLE BONE WITH FATTY MARROW AND NO EVIDENCE OF OSTEOMYELITIS      - VIABLE SOFT TISSUE WITHOUT ACUTE INFLAMMATION 8.  LEFT 5TH METATARSAL, CLEAN MARGIN:             - VIABLE SOFT TISSUE WITH PERIOSTEAL REACTIVE BONE FORMATION BUT NO MATURE BONE OR INFLAMMATION  9.  LEFT FOOT, TRANSMETATARSAL AMPUTATION:             - THIRD, FOURTH, AND FIFTH TOES WITH ARTICULAR CARTILAGE AND VIABLE BONE WITH FATTY MARROW, RARE ACUTE INFLAMMATORY CELL, BUT NO EVIDENCE OF OSTEOMYELITIS, AND VIABLE SOFT TISSUE WITH VERY FOCAL ACUTE INFLAMMATION       - THREE OF FOUR SEPARATE SPECIMENS EXHIBITING ACUTE OSTEOMYELITIS      - DORSAL AND PLANTER SURGICAL MARGINS EXHIBITING ERODED AND ULCERATED SKIN AND UNDERLYING ACUTE INFLAMMATION Assessment/Plan: 71 y.o. male with S/p left foot transmetatarsal amputation, partially OPEN (DOS: 10/15/2021).  - Dressings changed dakins, DSD- Podiatric surgery will perform dressing changes as indicated- Nursing please reinforce dressing PRN with ABDs, 4x4 gauze, kerlix, ACE- Plan for OR on 10/22/21 for Left foot revisional TMA, delayed primary closure versus graft/VAC application - Discussed surgical options in detail including left foot transmetatarsal amputation. We discussed the proposed procedure in detail, and also discussed potential risks and complications including but not limited to risk of: infection, swelling, numbness, pain discomfort, recurrence of problem,  DVT, PE and death. [name] has agreed to accept these risks, no guarantees were given or implied.  Written informed consent was previously obtained.  Abx- vanc 10/14/2021-present- zosyn 10/14/2021-present Consult- Medicine: for medical comanagement and pre-op risk stratification- VS: for perfusion assessment; perfusion deemed to be adequate vascular signed off- Infectious Disease: to be consulted after staged procedure for abx guidance  Diet: CCActivity: NWB to LLE, WBAT to RLEDVT Ppx: lovenoxCode: Full Dispo:  OR Tuesday (10/22/21) for Left foot revisional TMA, delayed primary closure versus graft/VAC application  Attending Dr. Leland Her, DPM, PhDPodiatric Surgery, PGY-1 ______________________________Discussed with resident, agree with plan. Electronically Signed by Paulene Floor, DPM, October 20, 2021

## 2021-10-20 NOTE — Other
Medicine Consult Note Hospital Day: 5SubjectiveFeels well, no muscle or joint aches. Feels he is tolerating crestor better than lipitor. ObjectiveVitals:  10/20/21 1243 BP: 102/70 Pulse: 73 Resp: 18 Temp: 98.3 ?F (36.8 ?C)  Exam: Gen: Talkative well appearing gentleman sitting up in chair with feet propped up, conversational NADCV: RRR, no murmur or gallopPulm: No increased WOB, CTABLE: No edema in ankles, feet wrapped bilaterally Skin: Bilateral dorsal hand erythema improving Labs notable for:Cr 0.8Vit B12 217Iron studies:  Iron 30, TIBC 199, iron sat 15%, ferritin 126 Micro:4/24 deep wound +MRSA and pansensitive pseudomonasAssessment & Plan36 y.o. year old M with history significant for controlled DM, CAD s/p remote stents and CABG 2022, with prior toe amputations iso neuropathy and likely vascular disease who is s/p L foot debridement of abscess, I&D, and ultimately TMA on 4/25 with plan for closure on 5/2. Medicine is consulted for co-management. # foot wounds, s/p L TMA- Abx per podiatry	- Vanc/Zosyn 4/24 - present- Pain control per podiatry- Lovenox 40 QD for vte ppx #microcytic anemiaFound to have B12 deficiency- cont B12 PO supplementation 1000mg  daily#T2DM - A1c 6.8 06/2021- BG well controlled on ssi#CADS/p CABG Jan 2022.  Reports recovering well- ASA 81 mg QD for now (home 325)- metoprolol tartrate 25mg  BID- cont rosuvastatin 10mg  qd to target LDL < 70 for secondary prevention, will monitor for myalgias#Sunburn - photosensitivity iso doxycycline- Lubriderm ordered for bilateral hands#Depression- Lexapro 10mg QDSigned:Fletcher Anon, MD MPHContact through [facility]

## 2021-10-20 NOTE — Plan of Care
Plan of Care Overview/ Patient Status    Problem: Adult Inpatient Plan of CareGoal: Plan of Care ReviewOutcome: Interventions implemented as appropriatePatient is A+O times 4, pleasant. Meds and nursing care explained, patient verbalized understanding. Afebrile, vital signs stable. No complaints of pain. Patient on room air. Assist x 1 RW in room, up to toilet. Last BM today, holding miralax due to reports of diarrhea. NWB to LLE. Dressing to lower extremities done by podiatry. Skin tear to forearm OTA. Plan for staged procedure 5/2. IV abx given as scheduled. Safety and contact measures maintained. T&P self. See doc flow sheet for complete assessment. Continue to monitor. Pietro Cassis, RN

## 2021-10-21 LAB — CBC WITH AUTO DIFFERENTIAL
BKR WAM ABSOLUTE IMMATURE GRANULOCYTES.: 0.09 x 1000/ÂµL (ref 0.00–0.30)
BKR WAM ABSOLUTE LYMPHOCYTE COUNT.: 1.82 x 1000/ÂµL (ref 0.60–3.70)
BKR WAM ABSOLUTE NRBC (2 DEC): 0 x 1000/ÂµL (ref 0.00–1.00)
BKR WAM ANALYZER ANC: 5.67 x 1000/ÂµL (ref 2.00–7.60)
BKR WAM BASOPHIL ABSOLUTE COUNT.: 0.08 x 1000/ÂµL (ref 0.00–1.00)
BKR WAM BASOPHILS: 0.9 % (ref 0.0–1.4)
BKR WAM EOSINOPHIL ABSOLUTE COUNT.: 0.32 x 1000/ÂµL (ref 0.00–1.00)
BKR WAM EOSINOPHILS: 3.7 % (ref 0.0–5.0)
BKR WAM HEMATOCRIT (2 DEC): 35.4 % — ABNORMAL LOW (ref 38.50–50.00)
BKR WAM HEMOGLOBIN: 11.1 g/dL — ABNORMAL LOW (ref 13.2–17.1)
BKR WAM IMMATURE GRANULOCYTES: 1 % (ref 0.0–1.0)
BKR WAM LYMPHOCYTES: 21.2 % (ref 17.0–50.0)
BKR WAM MCH (PG): 24.9 pg — ABNORMAL LOW (ref 27.0–33.0)
BKR WAM MCHC: 31.4 g/dL (ref 31.0–36.0)
BKR WAM MCV: 79.4 fL — ABNORMAL LOW (ref 80.0–100.0)
BKR WAM MONOCYTE ABSOLUTE COUNT.: 0.6 x 1000/ÂµL (ref 0.00–1.00)
BKR WAM MONOCYTES: 7 % (ref 4.0–12.0)
BKR WAM MPV: 8.1 fL (ref 8.0–12.0)
BKR WAM NEUTROPHILS: 66.2 % (ref 39.0–72.0)
BKR WAM NUCLEATED RED BLOOD CELLS: 0 % (ref 0.0–1.0)
BKR WAM PLATELETS: 363 x1000/ÂµL (ref 150–420)
BKR WAM RDW-CV: 14.6 % (ref 11.0–15.0)
BKR WAM RED BLOOD CELL COUNT.: 4.46 M/ÂµL (ref 4.00–6.00)
BKR WAM WHITE BLOOD CELL COUNT: 8.6 x1000/ÂµL (ref 4.0–11.0)

## 2021-10-21 LAB — BASIC METABOLIC PANEL
BKR ANION GAP: 12 (ref 7–17)
BKR BLOOD UREA NITROGEN: 14 mg/dL (ref 8–23)
BKR BUN / CREAT RATIO: 17.5 (ref 8.0–23.0)
BKR CALCIUM: 9.7 mg/dL (ref 8.8–10.2)
BKR CHLORIDE: 107 mmol/L (ref 98–107)
BKR CO2: 18 mmol/L — ABNORMAL LOW (ref 20–30)
BKR CREATININE: 0.8 mg/dL (ref 0.40–1.30)
BKR EGFR, CREATININE (CKD-EPI 2021): >60 mL/min/{1.73_m2} (ref >=60)
BKR GLUCOSE: 144 mg/dL — ABNORMAL HIGH (ref 70–100)
BKR POTASSIUM: 4.2 mmol/L (ref 3.3–5.3)
BKR SODIUM: 137 mmol/L (ref 136–144)

## 2021-10-21 LAB — MAGNESIUM: BKR MAGNESIUM: 1.7 mg/dL (ref 1.7–2.4)

## 2021-10-21 LAB — VANCOMYCIN, TROUGH
BKR VANCOMYCIN TROUGH: 32.4 ug/mL — CR (ref 10.0–15.0)
BKR VANCOMYCIN TROUGH: 18.3 ug/mL — ABNORMAL HIGH (ref 10.0–15.0)

## 2021-10-21 LAB — COVID-19 CLEARANCE OR FOR PLACEMENT ONLY: BKR SARS-COV-2 RNA (COVID-19) (YH): NEGATIVE

## 2021-10-21 MED ORDER — VANCOMYCIN MAR LEVEL
Freq: Once | INTRAVENOUS | Status: DC
Start: 2021-10-21 — End: 2021-10-21

## 2021-10-21 MED ORDER — SENNOSIDES 8.6 MG TABLET
8.6 mg | Freq: Every evening | ORAL | Status: DC
Start: 2021-10-21 — End: 2021-11-02
  Administered 2021-10-22 – 2021-11-01 (×10): 8.6 mg via ORAL

## 2021-10-21 MED ORDER — MAGNESIUM SULFATE 2 GRAM/50 ML (4 %) IN WATER INTRAVENOUS PIGGYBACK
2 gram/50 mL (4 %) | Freq: Once | INTRAVENOUS | Status: CP
Start: 2021-10-21 — End: ?
  Administered 2021-10-21: 19:00:00 2 mL/h via INTRAVENOUS

## 2021-10-21 MED ORDER — POLYETHYLENE GLYCOL 3350 17 GRAM ORAL POWDER PACKET
17 gram | Freq: Two times a day (BID) | ORAL | Status: DC | PRN
Start: 2021-10-21 — End: 2021-10-22

## 2021-10-21 MED ORDER — VANCOMYCIN MAR LEVEL
Freq: Once | INTRAVENOUS | Status: CP
Start: 2021-10-21 — End: ?

## 2021-10-21 NOTE — Progress Notes
[facility]	Podiatry Progress Note5/1/2023Subjective: Patient seen at bedside this AM, NAD. NAEO.  At this time discussed surgical plans for transmetatarsal amputation this coming Tuesday, patient is still agreeable to this plan. Denies any pain or n/f/c/vomiting/sob/cp. S/p left foot transmetatarsal amputation, partially OPEN (DOS: 10/15/2021).Objective: VitalsTemp:  [97.9 ?F (36.6 ?C)-98.4 ?F (36.9 ?C)] 97.9 ?F (36.6 ?C)Pulse:  [68-73] 72Resp:  [17-20] 20BP: (102-127)/(70-79) 127/76SpO2:  [97 %-98 %] 98 %Gen: Alert, no distressLeft foot: Dressings intact with strike-through. Upon removal of dressings, surgical site with exposed bone centrally and deeper soft tissue. No oozing noted from medullary canals of bones.  Mild oozing noted from skin edges.  No active bleeders appreciated.  Oozing noted to stop, with compression. No evidence of necrosis. No purulence expressed with manual exsanguination. Moderate peri-surgical site edema and erythema consistent with post-operative changes. No appreciable soft tissue crepitus or fluctuance. Sutures clean, dry, and intact.Labs: Recent Labs   04/28/230913 04/28/231158 04/29/230602 04/29/230826 04/30/230720 04/30/230828 04/30/232100 WBC 5.9  --  7.3  --   --   --   --  HGB 11.5*  --  11.3*  --   --   --   --  HCT 36.20*  --  35.00*  --   --   --   --  CREATININE 0.80  --  0.80  --  0.80  --   --  BUN 10  --  11  --  12  --   --  CO2 20  --  20  --  20  --   --  PLT 374  --  350  --   --   --   --  GLU 143*   < > 154*   < > 148*   < > 166* K 4.4  --  4.1  --  4.4  --   --  NA 138  --  137  --  139  --   --  CL 105  --  107  --  108*  --   --   < > = values in this interval not displayed. Imaging: XR Left Foot: 10/15/2021 FINDINGS:Patient is status post interval transmetatarsal amputation at the level of the mid shaft.Mild degenerative changes are noted in the included midfoot with small ossicles at the dorsal aspect of the talonavicular joint, in keeping with a sequel of prior injury.Soft tissue swelling and air is in keeping with postsurgical changes.IMPRESSION: Post surgical changes, as above.Micro/Path: Micro: 10/15/2021 -  final1. Left 2nd toe wound -  strep viridans2. Left 3rd toe bone -  strep viridans3. Left foot tissue - Pseudomonas aeruginosa, mixed GP/GN organism4. Left 1st metatarsal clean margin -  NGTD5. Left 2nd metatarsal clean margin - NGTD6. Left 3rd metatarsal clean margin - NGTD7. Left 4th metatarsal clean margin- SA8. Left 5th metatarsal clean margin- SA Path: 10/15/2021 - FINAL DIAGNOSIS 1.  LEFT 2ND TOE BONE, BIOPSY:        - ACUTE OSTEOMYELITIS 2.  LEFT 3RD TOE BONE, BIOPSY:             - ARTICULAR CARTILAGE AND VIABLE BONE WITH ACUTE OSTEOMYELITIS AND FEATURES OF OSTEOARTHRITIS      - VIABLE SOFT TISSUE WITH ACUTE AND CHRONIC INFLAMMATION 3.  LEFT FOOT TISSUE, DEBRIDEMENT:             - BONE WITH ACUTE OSTEOMYELITIS AND OVERLYING ACUTELY INFLAMED SOFT TISSUE 4.  LEFT 1ST METATARSAL, CLEAN MARGIN:             -  VIABLE BONE WITH FATTY MARROW AND AGGREGATES OF ACUTE INFLAMMATORY CELLS CONSISTENT WITH FOCAL ACUTE OSTEOMYELITIS 5.  LEFT 2ND METATARSAL, CLEAN MARGIN:             - VIABLE AND FOCALLY DEVITALIZED BONE WITH FATTY MARROW AND ACUTE INFLAMMATORY CELLS CONSISTENT WITH FOCAL ACUTE OSTEOMYELITIS 6.  LEFT 3RD METATARSAL CLEAN MARGIN:             - VIABLE BONE WITH FATTY MARROW AND SCATTERED ACUTE INFLAMMATORY CELLS BUT NO EVIDENCE OF OSTEOMYELITIS      - VIABLE SOFT TISSUE WITHOUT ACUTE INFLAMMATION 7.  LEFT 4TH METATARSAL, CLEAN MARGIN:             -  VIABLE BONE WITH FATTY MARROW AND NO EVIDENCE OF OSTEOMYELITIS      - VIABLE SOFT TISSUE WITHOUT ACUTE INFLAMMATION 8.  LEFT 5TH METATARSAL, CLEAN MARGIN:             - VIABLE SOFT TISSUE WITH PERIOSTEAL REACTIVE BONE FORMATION BUT NO MATURE BONE OR INFLAMMATION  9.  LEFT FOOT, TRANSMETATARSAL AMPUTATION:             - THIRD, FOURTH, AND FIFTH TOES WITH ARTICULAR CARTILAGE AND VIABLE BONE WITH FATTY MARROW, RARE ACUTE INFLAMMATORY CELL, BUT NO EVIDENCE OF OSTEOMYELITIS, AND VIABLE SOFT TISSUE WITH VERY FOCAL ACUTE INFLAMMATION       - THREE OF FOUR SEPARATE SPECIMENS EXHIBITING ACUTE OSTEOMYELITIS      - DORSAL AND PLANTER SURGICAL MARGINS EXHIBITING ERODED AND ULCERATED SKIN AND UNDERLYING ACUTE INFLAMMATION Assessment/Plan: 71 y.o. male with S/p left foot transmetatarsal amputation, partially OPEN (DOS: 10/15/2021).  - Dressings changed dakins, DSD- Podiatric surgery will perform dressing changes as indicated- Nursing please reinforce dressing PRN with ABDs, 4x4 gauze, kerlix, ACE- Plan for OR on 10/22/21 for Left foot revisional TMA, delayed primary closure with flap versus graft/VAC application, possible TAL- Consent previously obtained - Will make patient NPO at midnight the night prior to surgery - Will obtain new COVID test tonight- Will obtain AM labs on day of surgery: CBC, BMP, Coags, T&S- Will hold AC/AP starting today, if medically appropriate and benefits outweighs the risks.- Surgical site to be marked day of surgery - Post-op note to follow after surgery Abx- vanc 10/14/2021-present- zosyn 10/14/2021-present Consult- Medicine: for medical comanagement and pre-op risk stratification- VS: for perfusion assessment; perfusion deemed to be adequate vascular signed off- Infectious Disease: to be consulted after staged procedure for abx guidance  Diet: CCActivity: NWB to LLE, WBAT to RLEDVT Ppx: lovenoxCode: Full Dispo:  OR Tuesday (10/22/21) for Left foot revisional TMA, delayed primary closure with flap versus graft/VAC application, possible Tendo Achilles lengthening Will discuss with attending Dr. Lesle Reek page on-call podiatry resident with questions:?	SEE [place] FOR CALL Angela Burke, DPM PGY-2 [facility]iatric SurgeryElectronically Signed by Donne Hazel, DPM, May 1, 2023Discussed with resident, agree with plan. Electronically Signed by Paulene Floor, DPM, Oct 21, 2021

## 2021-10-21 NOTE — Other
VANCOMYCIN LEVEL EVALUATIONCurrent Vancomycin Order: 1.25 g IV every 12 hours. Day of Therapy: 8Vancomycin Indication: MRSA (in addition to S viridans, also pseudomonas aeruginosa)Estimated Creatinine Clearance: 93 mL/min (by C-G formula based on SCr of 0.8 mg/dL).Creatinine (mg/dL) Date Value 96/09/5407 0.80 10/20/2021 0.80 10/19/2021 0.80  Renal Function: StableLevel: Lab Results Last 72 Hours Component Value Date/Time  Vancomycin Trough 18.3 (H) 10/21/2021 04:17 PM  Vancomycin Trough 32.4 (HH) 10/21/2021 08:53 AM Type of Level: Trough; Level drawn appropriately? YesBased on vancomycin level obtained, recommend:  Continue current regimenRepeat Level: Weekly - not yet scheduledID/AST Consulted? NoYNHH/LMH/WH Vancomycin Dosing GuidelineBH Vancomycin Dosing GuidelineFor questions, please contact the pharmacist: Feliz Beam, PharmD    Phone/Mobile Heartbeat: MB

## 2021-10-21 NOTE — Plan of Care
Problem: Adult Inpatient Plan of CareGoal: Readiness for Transition of CareOutcome: Interventions implemented as appropriate Plan of Care Overview/ Patient Status    Discussed pt in TCR, is not medically ready for dc: Pt to OR tomorrow for Left foot revisional TMA, delayed primary closure with flap versus graft/VAC application, possible TAL.CM cont to follow.Ilissa Rosner B. Christin Fudge, RN, Mining engineer, SLA-2 & V2-NMHB/Cell: [phone]

## 2021-10-21 NOTE — Plan of Care
Plan of Care Overview/ Patient Status    Assumed care 7 am. Patient alert, oriented x 4, denies pain or discomfort, VSS, ate well, took medications whole with water, no prns. Left foot dressing dry and intact, dressing is done by Podiatry MD, pt uses urinal independently, assist of one with the walker to the toilet. Had BM today.

## 2021-10-21 NOTE — Plan of Care
Plan of Care Overview/ Patient Status    Patient alert and oriented x4. VSS on RA. No complaints of pain. Ax1 with RW to bathroom. NWB to LLE. Dressing to LLE C/D/I - dressing changes being performed by podiatry. LBM 4/30. IV ABX continued. T & R independent in bed. Call bell in reach. Fall, safety and contact precautions maintained. Tomi Likens, RN 12:18 AM 10/21/2021

## 2021-10-21 NOTE — Other
VANCOMYCIN DAILY MONITORINGDay of Therapy: 8Vancomycin Indication: MRSA (in addition to S viridans, also pseudomonas aeruginosa)When is vancomycin level indicated? 5/1 at 1700Note level ordered for 5/1 at 0500 was not drawn, dose was administered at 0534. Level obtained at 08:53 is not reflective of patient's trough on current regimen.Please obtain trough level prior to this evenings dose, before the infusion is started. Trough level is ordered for today, 5/1 at 1700.Opportunity for antimicrobial de-escalation? NoRenal Function: StableLab Results Component Value Date/Time  CREATININE 0.80 10/21/2021 08:01 AM  CREATININE 0.80 10/20/2021 07:20 AM  CREATININE 0.80 10/19/2021 06:02 AM  CREATININE 0.80 10/18/2021 09:13 AM  CREATININE 0.80 10/17/2021 11:19 AM Dawayne Cirri, PharmDClinical Pharmacist5/06/2021 9:33 AM

## 2021-10-21 NOTE — Plan of Care
[facility]		Spiritual Care NotePurpose: Referral Source: Protocol Observation: People present/Information Obtained From: Patient Emotional Mood: Hopeful, In Good Spirits Quality of Relational Support: Familiy In-Town and Involved, Family Out-of-Town but Involved Types of Relational Support: Family   Subjective: Pre-op visit. Amory welcomed a visit and shared about his issues with his feet he's been going through the past two years. He is from [place] and is up here working. 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 His son works with him and visits daily. His family are mostly in [place]. After his amputation tomorrow he is planning on returning to [facility] and recovering for several months. He spoke about his support from his family and church community. Offered compassionate listening and encouragement. He expressed gratitude for the visit. Spiritual Assessment:Referral Source: Protocol Information Obtained From: Patient Mood: Hopeful, In Good SpiritsRelational SupportTypes of Relational Support: Science writer of Relational Support: Familiy In-Town and Involved, Family Out-of-Town but Involved   Spiritual Resources: Faith, Family Issues RaisedRaised by Patient: Adjustment to Illness Spiritual Interventions:Spiritual Intervention Index**Date of Spiritual Visit: 05/01/23Visit Type: Pre-Op VisitIntervention Type: Spiritual VisitResponding Chaplain: Unit ChaplainSpiritual/Religious Support Provided: Companionship, Spiritual SupportOutcome: OUTCOMES: Expressed Spiritual/Religious Resources or Distress: Expressed hope OUTCOMES: Expressed Emotional Resources or Distress: Relational resources identified   OUTCOMES: Progressed Emotionally or Physically: Increased comfort Plan: Will continue to follow up, as needed. Total Consult Time: 25 minutes	Signed: Beaulah Dinning, MDivMHB: [phone] chaplain available 24/7:SRC: [phone]: [phone] 5/1/20234:24 PM

## 2021-10-22 LAB — CBC WITH AUTO DIFFERENTIAL
BKR WAM ABSOLUTE IMMATURE GRANULOCYTES.: 0.09 x 1000/ÂµL (ref 0.00–0.30)
BKR WAM ABSOLUTE LYMPHOCYTE COUNT.: 1.8 x 1000/ÂµL (ref 0.60–3.70)
BKR WAM ABSOLUTE NRBC (2 DEC): 0 x 1000/ÂµL (ref 0.00–1.00)
BKR WAM ANALYZER ANC: 5.38 x 1000/ÂµL (ref 2.00–7.60)
BKR WAM BASOPHIL ABSOLUTE COUNT.: 0.06 x 1000/ÂµL (ref 0.00–1.00)
BKR WAM BASOPHILS: 0.7 % (ref 0.0–1.4)
BKR WAM EOSINOPHIL ABSOLUTE COUNT.: 0.34 x 1000/ÂµL (ref 0.00–1.00)
BKR WAM EOSINOPHILS: 4.1 % (ref 0.0–5.0)
BKR WAM HEMATOCRIT (2 DEC): 34.9 % — ABNORMAL LOW (ref 38.50–50.00)
BKR WAM HEMOGLOBIN: 11.4 g/dL — ABNORMAL LOW (ref 13.2–17.1)
BKR WAM IMMATURE GRANULOCYTES: 1.1 % — ABNORMAL HIGH (ref 0.0–1.0)
BKR WAM LYMPHOCYTES: 21.9 % (ref 17.0–50.0)
BKR WAM MCH (PG): 25.4 pg — ABNORMAL LOW (ref 27.0–33.0)
BKR WAM MCHC: 32.7 g/dL (ref 31.0–36.0)
BKR WAM MCV: 77.9 fL — ABNORMAL LOW (ref 80.0–100.0)
BKR WAM MONOCYTE ABSOLUTE COUNT.: 0.55 x 1000/ÂµL (ref 0.00–1.00)
BKR WAM MONOCYTES: 6.7 % (ref 4.0–12.0)
BKR WAM MPV: 8.1 fL (ref 8.0–12.0)
BKR WAM NEUTROPHILS: 65.5 % (ref 39.0–72.0)
BKR WAM NUCLEATED RED BLOOD CELLS: 0 % (ref 0.0–1.0)
BKR WAM PLATELETS: 357 x1000/ÂµL (ref 150–420)
BKR WAM RDW-CV: 14.6 % (ref 11.0–15.0)
BKR WAM RED BLOOD CELL COUNT.: 4.48 M/ÂµL (ref 4.00–6.00)
BKR WAM WHITE BLOOD CELL COUNT: 8.2 x1000/ÂµL (ref 4.0–11.0)

## 2021-10-22 LAB — BASIC METABOLIC PANEL
BKR ANION GAP: 10 (ref 7–17)
BKR BLOOD UREA NITROGEN: 14 mg/dL (ref 8–23)
BKR BUN / CREAT RATIO: 17.5 (ref 8.0–23.0)
BKR CALCIUM: 9.6 mg/dL (ref 8.8–10.2)
BKR CHLORIDE: 110 mmol/L — ABNORMAL HIGH (ref 98–107)
BKR CO2: 20 mmol/L (ref 20–30)
BKR CREATININE: 0.8 mg/dL (ref 0.40–1.30)
BKR EGFR, CREATININE (CKD-EPI 2021): >60 mL/min/{1.73_m2} (ref >=60)
BKR GLUCOSE: 128 mg/dL — ABNORMAL HIGH (ref 70–100)
BKR POTASSIUM: 4.3 mmol/L (ref 3.3–5.3)
BKR SODIUM: 140 mmol/L (ref 136–144)

## 2021-10-22 LAB — MAGNESIUM: BKR MAGNESIUM: 1.7 mg/dL (ref 1.7–2.4)

## 2021-10-22 MED ORDER — MAGNESIUM SULFATE 2 GRAM/50 ML (4 %) IN WATER INTRAVENOUS PIGGYBACK
2 gram/50 mL (4 %) | Freq: Once | INTRAVENOUS | Status: CP
Start: 2021-10-22 — End: ?
  Administered 2021-10-22: 17:00:00 2 mL/h via INTRAVENOUS

## 2021-10-22 MED ORDER — ASPIRIN 325 MG TABLET,DELAYED RELEASE
325 mg | Freq: Every day | ORAL | Status: AC
Start: 2021-10-22 — End: ?

## 2021-10-22 MED ORDER — POLYETHYLENE GLYCOL 3350 17 GRAM ORAL POWDER PACKET
17 gram | Freq: Every day | ORAL | Status: DC
Start: 2021-10-22 — End: 2021-11-02
  Administered 2021-10-26 – 2021-10-27 (×2): 17 gram via ORAL

## 2021-10-22 MED ORDER — VANCOMYCIN MAR LEVEL
Freq: Once | INTRAVENOUS | Status: CP
Start: 2021-10-22 — End: ?

## 2021-10-22 MED ORDER — METOPROLOL SUCCINATE ER 50 MG TABLET,EXTENDED RELEASE 24 HR
50 mg | Freq: Every day | ORAL | Status: AC
Start: 2021-10-22 — End: ?

## 2021-10-22 MED ORDER — GABAPENTIN 300 MG CAPSULE
300 mg | Freq: Three times a day (TID) | ORAL | Status: AC
Start: 2021-10-22 — End: ?

## 2021-10-22 MED ORDER — XIGDUO XR 5 MG-1,000 MG TABLET,EXTENDED RELEASE
5-1000 mg | ORAL | Status: AC
Start: 2021-10-22 — End: ?

## 2021-10-22 MED ORDER — ESCITALOPRAM 10 MG TABLET
10 mg | Freq: Every morning | ORAL | Status: SS
Start: 2021-10-22 — End: 2021-11-01

## 2021-10-22 NOTE — Plan of Care
Plan of Care Overview/ Patient Status    BP (!) 146/78  - Pulse (!) 54  - Temp 97.5 ?F (36.4 ?C) (Oral)  - Resp 18  - Ht 5' 11 (1.803 m)  - Wt 89.1 kg  - SpO2 99%  - BMI 27.41 kg/m? Assumed Care - 7:00 pm to 7:00 amPatient is a 71 year old male, alert and oriented x4. Patient is very pleasant and co-operative with his care and needs appropriately. This Thereasa Parkin assumed care with patient upright in bedside recliner chair resting comfortably. He continues on his antibiotic treatment this shift to include scheduled Vancomycin (q12) and Zosyn (q6). Nightly BG at 267 - 1 unit administered as per sliding scale parameters. Patient is able to ambulate to the bathroom with assist x1 and RW; he is NWB to the LLE. In bed BLE remains elevated with pillows in use. Dressing to LLE remains clean, dry and intact. Awaiting stool sample for C.Diff rule out. Patient complained of inability to move bowels today; prn senna administered. Patient is scheduled for planned closure in the OR tomorrow. NPO as of midnight. Rest and comfort promoted.Elizbeth Squires, RN2:22 AMProblem: Adult Inpatient Plan of CareGoal: Plan of Care Review5/07/2021 0217 by Elizbeth Squires, RNOutcome: Interventions implemented as appropriate5/07/2021 0214 by Elizbeth Squires, RNOutcome: Interventions implemented as appropriate5/07/2021 0205 by Elizbeth Squires, RNOutcome: Interventions implemented as appropriate Problem: Adult Inpatient Plan of CareGoal: Optimal Comfort and Wellbeing5/07/2021 0217 by Elizbeth Squires, RNOutcome: Interventions implemented as appropriate5/07/2021 0214 by Elizbeth Squires, RNOutcome: Interventions implemented as appropriate5/07/2021 0205 by Elizbeth Squires, RNOutcome: Interventions implemented as appropriate Problem: Fall Injury RiskGoal: Absence of Fall and Fall-Related Injury5/07/2021 0217 by Elizbeth Squires, RNOutcome: Interventions implemented as appropriate5/07/2021 0214 by Elizbeth Squires, RNOutcome: Interventions implemented as appropriate5/07/2021 0205 by Elizbeth Squires, RNOutcome: Interventions implemented as appropriate

## 2021-10-22 NOTE — Other
Medicine Consult Note Hospital Day: 6SubjectivePatient seen at bedside this AM in NAD. Feeling well overall. He reports frequent episodes of loose stool yesterday evening continuing into this morning. He denies abdominal pain. He denies nausea, vomiting, SOB, and chest pain.  ObjectiveVitals:  10/21/21 0747 BP: 136/82 Pulse: 64 Resp: 20 Temp: 98.5 ?F (36.9 ?C)  Exam: Gen: Well appearing male in NAD seen resting comfortably in bed. CV: RRR, no murmur or gallopPulm: No increased WOB, CTABLE: No pitting edema to BLE. Skin: Scaling on the dorsal hand bilaterally with improved erythema. Labs notable for:Cr 0.8 again todayMg 1.7 this morning Vit B12 217Iron studies:Iron 30, TIBC 199, iron sat 15%, ferritin 126 Micro:4/24 deep wound +MRSA and pansensitive pseudomonasAssessment & Plan60 y.o. year old M with history significant for controlled DM, CAD s/p remote stents and CABG 2022, with prior toe amputations iso neuropathy and likely vascular disease who is s/p L foot debridement of abscess, I&D, and ultimately TMA on 4/25 with plan for closure on 5/2. Medicine is consulted for co-management. # foot wounds, s/p L TMA- Abx per podiatry	- Vanc/Zosyn 4/24 - present- Pain control per podiatry- Patient to OR tomorrow with podiatry for left foot delayed primary closure. NPO midnight tonight. COVID negative. - Lovenox 40 QD for vte ppx (podiatry may hold in anticipation for OR tomorrow)#microcytic anemiaFound to have B12 deficiency- cont B12 PO supplementation 1000mg  daily#T2DM - A1c 6.8 06/2021- BG well controlled on ssi- Hold sliding scale insulin at midnight tonight after evening meal. - May resume sliding scale insulin post-operatively tomorrow when diet is resumed. #CADS/p CABG Jan 2022. Reports recovering well- Continue ASA 81 mg QD for now (home 325) - cont. metoprolol tartrate 25mg  BID- cont. rosuvastatin 10mg  qd to target LDL < 70 for secondary prevention, will continue to monitor for myalgias#Sunburn - photosensitivity iso doxycycline- Lubriderm ordered for bilateral hands#Diarrhea-pt has been refusing stool softeners for the past few days and continues to have frequent loose stools -C. Diff assay ordered for further eval-Stool softeners discontinued #Borderline Hypomagnesium - Replenished with IV mag#Depression- Lexapro 10mg QDElectronically Signed by Cleaster Corin, DPM, May 1, 2023Plans subject to change with attending's addendum.

## 2021-10-22 NOTE — Plan of Care
Edwin [name]			Location: SLA2/L273-71 y.o., male				Attending: Paulene Floor, DPM	Admit Date: 10/14/2021			[id number] LOS: 8 days Initial Nutrition AssessmentReason For Assessment: identified at risk by screening criteriaIdentified At Risk by Screening Criteria: large or nonhealing wound, burn or pressure injuryNutrition Assessment: Pt with PMH of DM c/b neuropathy, multiple podiatric amputations, presented with worsened L foot wound, found to have OM, s/p L TMA on 4/25 with plan for closure this week.Subjective: Pt unavailable/occupied on all attempted visits. All recommendations per chart review only. Current Diet Order: Diet Consistent Carbohydrate (2000 kcal)Current Intake: NPO most of 4/25 and today for procedure (diet advanced this afternoon during completion of note). Average 95.3% x 16 documented meals while on PO diet per RN flow sheets. I/Os: LBM 5/01Diet/Supplement History: No prior RD notes per chartFood Allergies: NKFA - per chartCultural/Religious/Ethnic Needs: UnknownSkin: Incisions/wound noted, no pressure related altered skin integrity per RN flow sheets. Medications: antibiotics, vitamin B12, SSI (held), sennaPertinent Labs: POCT BG x 24 hrs: 118-267. A1c= none on fileAnthropometrics:- Height: 5' 11 (1.803 m)- Current weight: 89.1 kg- BMI: Body mass index is 27.41 kg/m?Marland KitchenEdema: 1+ per RN flow sheets. Weight History: Ht and wt per chart, unable to confirm with pt or visualize at this time. Limited wt hx per chart. Wt Readings  10/14/21 89.1 kg  ESTIMATED NUTRITION REQUIREMENTS:Kcal/day: 2181		(MSJ x 1.3)Protein/day: 89-107		(1-1.2 gm/kg)Fluids/day: Fluid management per medical team discretion or 2181 (69mL/kcal)Needs based on: older adult male, ht (31), wt (89.1 kg), incision/woundNutrition Diagnosis: No nutrition diagnosis at this timeComments: Suspect pt eating adequately to meet estimated needs at this timeInterventions:Nutrition Prescription: Diet Consistent Carbohydrate (2000 kcal)Plan:	Meal and Snacks: General/Healthful diet	D/C & transfer of care to new setting or provider: Collaborative referal to other providers (likely d/c on consistent CHO diet) Goals: Pt to consume on average >75% of documented meals by next RD assessmentMonitoring/Evaluation:Food/Nutrition-Related Outcomes:            Food and nutrient intake/administration.Anthropometric Outcomes:            Weight changesBiochemical Data, Medical Tests, and Procedure Outcomes:            Labs (BMP).Nutrition-Focused Physical Finding Outcomes:            Muscle and fat wasting, swallow function, skin, appetite.RDN following per [facility] Clinical Nutrition Standards of Care.Registered Dietitian Nutritionist: Karyl Kinnier, RD, [facility] # [phone] [facility] # [phone]

## 2021-10-22 NOTE — Other
Subsequent to admission for surgery or invasive procedure, I have reassessed the patient by examination and review of relevant data pertaining to the planned procedure. I have verified the planned procedure and there are no relevant changes since the H&P.  Cardiac: RRR, s1/s2Lungs: CTAB, no resp distressDenies f/n/v/sob/cpPatient to OR today for Left foot revisional TMA, delayed primary closure with flap versus graft/VAC application, possible Tendo Achilles lengtheningConsent obtained; all details, potential risks and complications discussedNPO status confirmedSite markedElectronically Signed by Heidi Dach, DPM, Oct 22, 2021

## 2021-10-22 NOTE — Plan of Care
Plan of Care Overview/ Patient Status    0700 - 1900:Edwin [name] is an 71 y.o. male, A+O x 4,  VS: VSQResp: RAMobility: standby, 1 assist, NWB to LLEGI: Abdomen soft, nontender, normoactive bowel soundsGU: continent, up to toiletSkin: Please see flow sheets for assessmentDiet: NPO /cardiac dietPIV: Peripheral IV patent + easily flushesPain: Numerical pain scale in use, medicated per MAR.Meds: whole with sips of waterSafety: Call bell within reach.  Bed locked and in lowest position.  Bed alarm onIsolation: Standard PrecautionsMedications and care provided per POC.  Safety maintained. Patient educated on calling for assistance, call bell within reach.BP 100/64  - Pulse 68  - Temp 98.4 ?F (36.9 ?C) (Oral)  - Resp 20  - Ht 5' 11 (1.803 m)  - Wt 89.1 kg  - SpO2 98%  - BMI 27.41 kg/m? Electronically Signed by Campbell Riches, RN, Oct 22, 2021

## 2021-10-23 LAB — CBC WITH AUTO DIFFERENTIAL
BKR WAM ABSOLUTE IMMATURE GRANULOCYTES.: 0.06 x 1000/ÂµL (ref 0.00–0.30)
BKR WAM ABSOLUTE LYMPHOCYTE COUNT.: 1.96 x 1000/ÂµL (ref 0.60–3.70)
BKR WAM ABSOLUTE NRBC (2 DEC): 0 x 1000/ÂµL (ref 0.00–1.00)
BKR WAM ANALYZER ANC: 4.68 x 1000/ÂµL (ref 2.00–7.60)
BKR WAM BASOPHIL ABSOLUTE COUNT.: 0.07 x 1000/ÂµL (ref 0.00–1.00)
BKR WAM BASOPHILS: 0.9 % (ref 0.0–1.4)
BKR WAM EOSINOPHIL ABSOLUTE COUNT.: 0.61 x 1000/ÂµL (ref 0.00–1.00)
BKR WAM EOSINOPHILS: 7.7 % — ABNORMAL HIGH (ref 0.0–5.0)
BKR WAM HEMATOCRIT (2 DEC): 35 % — ABNORMAL LOW (ref 38.50–50.00)
BKR WAM HEMOGLOBIN: 11.3 g/dL — ABNORMAL LOW (ref 13.2–17.1)
BKR WAM IMMATURE GRANULOCYTES: 0.8 % (ref 0.0–1.0)
BKR WAM LYMPHOCYTES: 24.8 % (ref 17.0–50.0)
BKR WAM MCH (PG): 25.2 pg — ABNORMAL LOW (ref 27.0–33.0)
BKR WAM MCHC: 32.3 g/dL (ref 31.0–36.0)
BKR WAM MCV: 78 fL — ABNORMAL LOW (ref 80.0–100.0)
BKR WAM MONOCYTE ABSOLUTE COUNT.: 0.53 x 1000/ÂµL (ref 0.00–1.00)
BKR WAM MONOCYTES: 6.7 % (ref 4.0–12.0)
BKR WAM MPV: 8 fL (ref 8.0–12.0)
BKR WAM NEUTROPHILS: 59.1 % (ref 39.0–72.0)
BKR WAM NUCLEATED RED BLOOD CELLS: 0 % (ref 0.0–1.0)
BKR WAM PLATELETS: 339 x1000/ÂµL (ref 150–420)
BKR WAM RDW-CV: 14.6 % (ref 11.0–15.0)
BKR WAM RED BLOOD CELL COUNT.: 4.49 M/ÂµL (ref 4.00–6.00)
BKR WAM WHITE BLOOD CELL COUNT: 7.9 x1000/ÂµL (ref 4.0–11.0)

## 2021-10-23 LAB — BASIC METABOLIC PANEL
BKR ANION GAP: 12 g/dL — ABNORMAL LOW (ref 7–17)
BKR BLOOD UREA NITROGEN: 16 mg/dL (ref 8–23)
BKR BUN / CREAT RATIO: 20 (ref 8.0–23.0)
BKR CALCIUM: 9.6 mg/dL (ref 8.8–10.2)
BKR CHLORIDE: 107 mmol/L (ref 98–107)
BKR CO2: 18 mmol/L — ABNORMAL LOW (ref 20–30)
BKR CREATININE: 0.8 mg/dL (ref 0.40–1.30)
BKR EGFR, CREATININE (CKD-EPI 2021): >60 mL/min/{1.73_m2} (ref >=60)
BKR GLUCOSE: 158 mg/dL — ABNORMAL HIGH (ref 70–100)
BKR POTASSIUM: 4.2 mmol/L (ref 3.3–5.3)
BKR SODIUM: 137 mmol/L (ref 136–144)

## 2021-10-23 LAB — MAGNESIUM: BKR MAGNESIUM: 1.9 mg/dL (ref 1.7–2.4)

## 2021-10-23 MED ORDER — FRUIT JUICE
ORAL | Status: DC | PRN
Start: 2021-10-23 — End: 2021-11-02

## 2021-10-23 MED ORDER — DEXTROSE 10 % IV BOLUS FOR ORDERABLE
INTRAVENOUS | Status: DC | PRN
Start: 2021-10-23 — End: 2021-11-02

## 2021-10-23 MED ORDER — SKIM MILK
ORAL | Status: DC | PRN
Start: 2021-10-23 — End: 2021-11-02

## 2021-10-23 MED ORDER — GLUCAGON 1 MG/ML IN STERILE WATER
Freq: Once | INTRAMUSCULAR | Status: DC | PRN
Start: 2021-10-23 — End: 2021-11-02

## 2021-10-23 MED ORDER — VANCOMYCIN MAR LEVEL
Freq: Once | INTRAVENOUS | Status: DC
Start: 2021-10-23 — End: 2021-10-23

## 2021-10-23 MED ORDER — DEXTROSE 15 GRAM/60 ML ORAL LIQUID
15 gram/60 mL | ORAL | Status: DC | PRN
Start: 2021-10-23 — End: 2021-11-02

## 2021-10-23 NOTE — Plan of Care
Inpatient Physical Therapy Progress Note IP Adult PT Eval/Treat - 10/23/21 1310    Date of Visit / Treatment  Date of Visit / Treatment 10/23/21   Start Time 1300   End Time 1310   Total Treatment Time 10    General Information  Subjective patient resting in bed, I want to wait until after lunch   Precautions/Limitations Fall Precautions   Precautions/Limitations Comment NWB L LE    Weight Bearing Status  Weight Bearing Status Comments NWB L LE    Vital Signs and Orthostatic Vital Signs  Vital Signs Free text room air, NAD    Pain/Comfort  Pain Comment (Pre/Post Treatment Pain) no c/o    Cognition  Overall Cognitive Status WFL    Skin Assessment  Skin Assessment See Nursing Documentation    Mobility  Mobility Detailed Documentation Unable to assess   patient declined at this time, he is OOB amb to/from bathroom with nursing stand by assist, able to maintain NWB to L LE   Handoff Documentation  Handoff Patient in bed    PT- AM-PAC - Basic Mobility Screen- How much help from another person do you currently need.....  Turning from your back to your side while in a a flat bed without using rails? 4 - None - Does not require any help and does the activity independently. Can use assistive devices.   Moving from lying on your back to sitting on the side of a flat bed without using bed rails? 4 - None - Does not require any help and does the activity independently. Can use assistive devices.   Moving to and from a bed to a chair (including a wheelchair)? 3 - A Little - Requires a little help (supervision, minimal assistance). Can use assistive devices.   Standing up from a chair using your arms(e.g., wheelchair or bedside chair)? 3 - A Little - Requires a little help (supervision, minimal assistance). Can use assistive devices.   To walk in a hospital room? 3 - A Little - Requires a little help (supervision, minimal assistance). Can use assistive devices.   Climbing 3-5 steps with a railing? 2 - A Lot - Requires a lot of help (maximum to moderate assistance). Can use assistive devices.   AMPAC Mobility Score 19   TARGET Highest Level of Mobility Mobility Level 6, Walk 10+steps   ACTUAL Highest Level of Mobility Mobility Level 2, Turn self in bed/bed activity/ dependent transfer    Lower Extremity Exercises  Supine Exercises Heelslides;Hip abduction;Hip adduction;Quad sets;Glut sets    Clinical Impression  Follow up Assessment patient tolerated treatment well, he is mobilizing well with nursing and prior PT notes, declined OOB at this time.  Continue to recommend home with home PT when medically ready   Criteria for Skilled Therapeutic Interventions Met yes;treatment indicated   Rehab Potential good, to achieve stated therapy goals   Demonstrates Need for Referral to Another Service occupational therapy    Patient/Family Stated Goals  Patient/Family Stated Goal(s) return home    Frequency/Equipment Recommendations  PT Frequency 2x per week   What day of week is next treatment expected? Thursday   PT/PTA completing this assessment Debbie   Equipment Needs During Admission/Treatment Rolling walker    PT Recommendations for Inpatient Admission  Activity/Level of Assist out of bed;assist of 1;with rolling walker    Planned Treatment / Interventions  Education Treatment / Interventions Patient Education / Training   role of PT, POC, Goals   PT Discharge Summary  Physical Therapy Disposition Recommendation Home   Additional Physical Therapy Disposition Recommendations Home Physical Therapy     Mosetta Putt, DPT  [phone]

## 2021-10-23 NOTE — Other
Medicine Consult Note Hospital Day: 7SubjectivePatient seen at bedside this AM in NAD. Feeling well overall this morning. He states that his diarrhea has resolved and he is now struggling to go to the bathroom. He denies abdominal pain. He denies nausea, vomiting, SOB, and chest pain.  ObjectiveVitals:  10/22/21 1245 BP: 100/64 Pulse: 68 Resp: 20 Temp: 98.4 ?F (36.9 ?C)  Exam: Gen: Well appearing male in NAD seen resting comfortably in bed. CV: RRR, no murmur or gallopPulm: No increased WOB, CTABLE: No pitting edema to BLE. Skin: Scaling on the dorsal hand bilaterally with continued improvement in erythema. Labs notable for:Cr 0.8 todayHgb 11.4Mg  1.7 this morning Vit B12 217Iron studies:Iron 30, TIBC 199, iron sat 15%, ferritin 126 Micro:4/24 deep wound +MRSA and pansensitive pseudomonasPath:4/25Left foot with residual OM at 1st and 2nd metatarsal clean marginsAssessment & Plan89 y.o. year old M with history significant for controlled DM, CAD s/p remote stents and CABG 2022, with prior toe amputations iso neuropathy and likely vascular disease who is s/p L foot debridement of abscess, I&D, and ultimately TMA on 4/25 with plan for closure on 5/2. Medicine is consulted for co-management. # foot wounds, s/p L TMA- Abx per podiatry	- Vanc/Zosyn 4/24 - present- Pain control per podiatry- Initial plan for OR today, but patient's case canceled due to OR availability. Likely return to OR Friday for left foot delayed primary closure. - Lovenox 40 QD for vte ppx #microcytic anemiaFound to have B12 deficiency- cont B12 PO supplementation 1000mg  daily#T2DM - A1c 6.8 06/2021- BG well controlled on current ssi #CADS/p CABG Jan 2022. Reports recovering well- Continue ASA 81 mg QD for now (home 325) - cont. metoprolol tartrate 25mg  BID- cont. rosuvastatin 10mg  qd to target LDL < 70 for secondary prevention, will continue to monitor for myalgias#Sunburn - photosensitivity iso doxycycline- Lubriderm ordered for bilateral hands#Diarrhea - resolved-C. Diff assay discontinued #Borderline Hypomagnesium - Replenished with IV mag#Depression- Lexapro 10mg QDElectronically Signed by Cleaster Corin, DPM, May 2, 2023Plans subject to change with attending's addendum.

## 2021-10-23 NOTE — Progress Notes
[facility]	Podiatry Progress Note5/2/2023Subjective: Patient seen at bedside this AM, NAD. NAEO.  Return to patient's room this afternoon with attending.  Informed patient that his surgery needs to canceled today due to poor OR availability will be rescheduled for this coming Friday Denies any pain or n/f/c/vomiting/sob/cp. S/p left foot transmetatarsal amputation, partially OPEN (DOS: 10/15/2021).Objective: VitalsTemp:  [97.5 ?F (36.4 ?C)-98.4 ?F (36.9 ?C)] 97.8 ?F (36.6 ?C)Pulse:  [54-70] 65Resp:  [18-20] 18BP: (100-146)/(62-78) 101/62SpO2:  [95 %-100 %] 96 %Gen: Alert, no distressLeft foot: Dressings intact with strike-through. Upon removal of dressings, surgical site with exposed bone centrally and deeper soft tissue. No oozing noted from medullary canals of bones.  Mild oozing noted from skin edges.  No active bleeders appreciated.  Oozing noted to stop, with compression. No evidence of necrosis. No purulence expressed with manual exsanguination. Moderate peri-surgical site edema and erythema consistent with post-operative changes. No appreciable soft tissue crepitus or fluctuance. Sutures clean, dry, and intact.Grossly unchangedLabs: Recent Labs   05/01/231255 05/01/231637 05/02/230832 05/02/231741 WBC 8.6  --  8.2  --  HGB 11.1*  --  11.4*  --  HCT 35.40*  --  34.90*  --  CREATININE  --   --  0.80  --  BUN  --   --  14  --  CO2  --   --  20  --  PLT 363  --  357  --  GLU  --    < > 128* 231* K  --   --  4.3  --  NA  --   --  140  --  CL  --   --  110*  --   < > = values in this interval not displayed. Imaging: XR Left Foot: 10/15/2021 FINDINGS:Patient is status post interval transmetatarsal amputation at the level of the mid shaft.Mild degenerative changes are noted in the included midfoot with small ossicles at the dorsal aspect of the talonavicular joint, in keeping with a sequel of prior injury.Soft tissue swelling and air is in keeping with postsurgical changes.IMPRESSION: Post surgical changes, as above.Micro/Path: Micro: 10/15/2021 -  final1. Left 2nd toe wound -  strep viridans2. Left 3rd toe bone -  strep viridans3. Left foot tissue - Pseudomonas aeruginosa, mixed GP/GN organism4. Left 1st metatarsal clean margin -  NGTD5. Left 2nd metatarsal clean margin - NGTD6. Left 3rd metatarsal clean margin - NGTD7. Left 4th metatarsal clean margin- SA8. Left 5th metatarsal clean margin- SA Path: 10/15/2021 - FINAL DIAGNOSIS 1.  LEFT 2ND TOE BONE, BIOPSY:        - ACUTE OSTEOMYELITIS 2.  LEFT 3RD TOE BONE, BIOPSY:             - ARTICULAR CARTILAGE AND VIABLE BONE WITH ACUTE OSTEOMYELITIS AND FEATURES OF OSTEOARTHRITIS      - VIABLE SOFT TISSUE WITH ACUTE AND CHRONIC INFLAMMATION 3.  LEFT FOOT TISSUE, DEBRIDEMENT:             - BONE WITH ACUTE OSTEOMYELITIS AND OVERLYING ACUTELY INFLAMED SOFT TISSUE 4.  LEFT 1ST METATARSAL, CLEAN MARGIN:             - VIABLE BONE WITH FATTY MARROW AND AGGREGATES OF ACUTE INFLAMMATORY CELLS CONSISTENT WITH FOCAL ACUTE OSTEOMYELITIS 5.  LEFT 2ND METATARSAL, CLEAN MARGIN:             - VIABLE AND FOCALLY DEVITALIZED BONE WITH FATTY MARROW AND ACUTE INFLAMMATORY CELLS CONSISTENT WITH FOCAL ACUTE OSTEOMYELITIS 6.  LEFT 3RD METATARSAL CLEAN MARGIN:             -  VIABLE BONE WITH FATTY MARROW AND SCATTERED ACUTE INFLAMMATORY CELLS BUT NO EVIDENCE OF OSTEOMYELITIS      - VIABLE SOFT TISSUE WITHOUT ACUTE INFLAMMATION 7.  LEFT 4TH METATARSAL, CLEAN MARGIN:             -  VIABLE BONE WITH FATTY MARROW AND NO EVIDENCE OF OSTEOMYELITIS      - VIABLE SOFT TISSUE WITHOUT ACUTE INFLAMMATION 8.  LEFT 5TH METATARSAL, CLEAN MARGIN:             - VIABLE SOFT TISSUE WITH PERIOSTEAL REACTIVE BONE FORMATION BUT NO MATURE BONE OR INFLAMMATION  9.  LEFT FOOT, TRANSMETATARSAL AMPUTATION:             - THIRD, FOURTH, AND FIFTH TOES WITH ARTICULAR CARTILAGE AND VIABLE BONE WITH FATTY MARROW, RARE ACUTE INFLAMMATORY CELL, BUT NO EVIDENCE OF OSTEOMYELITIS, AND VIABLE SOFT TISSUE WITH VERY FOCAL ACUTE INFLAMMATION       - THREE OF FOUR SEPARATE SPECIMENS EXHIBITING ACUTE OSTEOMYELITIS      - DORSAL AND PLANTER SURGICAL MARGINS EXHIBITING ERODED AND ULCERATED SKIN AND UNDERLYING ACUTE INFLAMMATION Assessment/Plan: 71 y.o. male with S/p left foot transmetatarsal amputation, partially OPEN (DOS: 10/15/2021).  - Dressings changed dakins, DSD- Podiatric surgery will perform dressing changes as indicated- Nursing please reinforce dressing PRN with ABDs, 4x4 gauze, kerlix, ACE- Plan for OR on 10/22/21 for Left foot revisional TMA, delayed primary closure with flap versus graft/VAC application, possible TAL- Consent previously obtained - Will make patient NPO at midnight the night prior to surgery - Will obtain AM labs on day of surgery: CBC, BMP, Coags, T&S- Will hold AC/AP starting today, if medically appropriate and benefits outweighs the risks.- Surgical site to be marked day of surgery - Post-op note to follow after surgery- patient booked for surgery this coming Friday Abx- vanc 10/14/2021-present- zosyn 10/14/2021-present Consult- Medicine: for medical comanagement and pre-op risk stratification- VS: for perfusion assessment; perfusion deemed to be adequate vascular signed off- Infectious Disease: to be consulted after staged procedure for abx guidance  Diet: CCActivity: NWB to LLE, WBAT to RLEDVT Ppx: lovenoxCode: Full Dispo:  OR Friday (10/25/21) for Left foot revisional TMA, delayed primary closure with flap versus graft/VAC application, possible Tendo Achilles lengtheningSeen with with attending Dr. Lesle Reek page on-call podiatry resident with questions:SEE Army Melia FOR CALL Angela Burke, DPM PGY-2 [facility] SurgeryElectronically Signed by Donne Hazel, DPM, May 2, 2023I saw and evaluated Edwin [name] with the resident. I agree with the findings and the plan of care as documented in the resident note.Electronically Signed by Paulene Floor, DPM, 10/22/2021

## 2021-10-23 NOTE — Plan of Care
Plan of Care Overview/ Patient Status    BP 127/82  - Pulse 64  - Temp 97.7 ?F (36.5 ?C) (Oral)  - Resp 18  - Ht 5' 11 (1.803 m)  - Wt 89.1 kg  - SpO2 100%  - BMI 27.41 kg/m? Assumed Care - 7:00 pm to 7:00 amPatient is a 71 year old male, who is alert and oriented x4. He remains stable on room air and in good spirits despite today's cancelled procedure. Procedure rescheduled for Friday May 5th. No complaints tonight of pain, nor discomfort. He is continent, ambulatory with RW and stand by assist. Nightly BG at 174. Antibiotic treatment continued - Vancomycin 250 ml q12 and Zosyn 100 ml q6. Podiatry dressing changes completed today; dressing remains clean/dry/intact; no RN reinforcement required tonight. Rest and comfort prioritized.Elizbeth Squires, RN3:10 AMProblem: Adult Inpatient Plan of CareGoal: Plan of Care ReviewOutcome: Interventions implemented as appropriate Problem: Adult Inpatient Plan of CareGoal: Optimal Comfort and WellbeingOutcome: Interventions implemented as appropriate Problem: InfectionGoal: Absence of Infection Signs and SymptomsOutcome: Interventions implemented as appropriate

## 2021-10-23 NOTE — Plan of Care
Problem: Fall Injury RiskGoal: Absence of Fall and Fall-Related InjuryOutcome: Interventions implemented as appropriate Problem: Impaired Wound HealingGoal: Optimal Wound HealingOutcome: Interventions implemented as appropriate Problem: Physical Therapy GoalsGoal: Physical Therapy GoalsDescription: PT GOALS1. Patient will perform bed mobility independently2. Patient will perform transfers with least assistive device with modified independence3. Patient will ambulate a minimum of 50 feet with least assistive device with modified independenceOutcome: Interventions implemented as appropriate Problem: InfectionGoal: Absence of Infection Signs and SymptomsOutcome: Interventions implemented as appropriate Plan of Care Overview/ Patient Status    0700-1530Pt is A&OX 4 on Ra. Continent of B&B. LBM 5/3. CCC diet pills whole with water. Stand by assist with a RW, NWB to the LLE. LLE diabetic wound with dressing in place, CDI. RLE plantar DTI, black & OTA. All toes amputated except big toe. Podiatry f/o with dressing change this shift. T&Rq2h, heels offloaded. Hourly rounding. Call bell within reach. Alarmed and audible. Safety maintained. Mickie Kay, RN5/08/2021

## 2021-10-23 NOTE — Progress Notes
[facility]	Podiatry Progress Note5/3/2023Subjective: Patient seen at bedside this AM, NAD. NAEO.  Return to patient's room this afternoon with attending.  Informed patient that his surgery needs to canceled today due to poor OR availability will be rescheduled for this coming Friday. Denies any pain or n/f/c/vomiting/sob/cp. S/p left foot transmetatarsal amputation, partially OPEN (DOS: 10/15/2021).Objective: VitalsTemp:  [97.6 ?F (36.4 ?C)-98.4 ?F (36.9 ?C)] 97.6 ?F (36.4 ?C)Pulse:  [64-72] 72Resp:  [18-20] 18BP: (100-145)/(62-85) 145/85SpO2:  [95 %-100 %] 98 %Gen: Alert, no distressLeft foot: Dressings intact with strike-through. Upon removal of dressings, surgical site with exposed bone centrally and deeper soft tissue. No oozing noted from medullary canals of bones.  Mild oozing noted from skin edges.  No active bleeders appreciated.  Oozing noted to stop, with compression. No evidence of necrosis. No purulence expressed with manual exsanguination. Moderate peri-surgical site edema and erythema consistent with post-operative changes. No appreciable soft tissue crepitus or fluctuance. Sutures clean, dry, and intact.Grossly unchangedLabs: Recent Labs   05/01/231255 05/01/231637 05/02/230832 05/02/231741 05/02/232059 WBC 8.6  --  8.2  --   --  HGB 11.1*  --  11.4*  --   --  HCT 35.40*  --  34.90*  --   --  CREATININE  --   --  0.80  --   --  BUN  --   --  14  --   --  CO2  --   --  20  --   --  PLT 363  --  357  --   --  GLU  --    < > 128*   < > 174* K  --   --  4.3  --   --  NA  --   --  140  --   --  CL  --   --  110*  --   --   < > = values in this interval not displayed. Imaging: XR Left Foot: 10/15/2021 FINDINGS:Patient is status post interval transmetatarsal amputation at the level of the mid shaft.Mild degenerative changes are noted in the included midfoot with small ossicles at the dorsal aspect of the talonavicular joint, in keeping with a sequel of prior injury.Soft tissue swelling and air is in keeping with postsurgical changes.IMPRESSION: Post surgical changes, as above.Micro/Path: Micro: 10/15/2021 -  final1. Left 2nd toe wound -  strep viridans2. Left 3rd toe bone -  strep viridans3. Left foot tissue - Pseudomonas aeruginosa, mixed GP/GN organism4. Left 1st metatarsal clean margin -  NGTD5. Left 2nd metatarsal clean margin - NGTD6. Left 3rd metatarsal clean margin - NGTD7. Left 4th metatarsal clean margin- SA8. Left 5th metatarsal clean margin- SA Path: 10/15/2021 - FINAL DIAGNOSIS 1.  LEFT 2ND TOE BONE, BIOPSY:        - ACUTE OSTEOMYELITIS 2.  LEFT 3RD TOE BONE, BIOPSY:             - ARTICULAR CARTILAGE AND VIABLE BONE WITH ACUTE OSTEOMYELITIS AND FEATURES OF OSTEOARTHRITIS      - VIABLE SOFT TISSUE WITH ACUTE AND CHRONIC INFLAMMATION 3.  LEFT FOOT TISSUE, DEBRIDEMENT:             - BONE WITH ACUTE OSTEOMYELITIS AND OVERLYING ACUTELY INFLAMED SOFT TISSUE 4.  LEFT 1ST METATARSAL, CLEAN MARGIN:             - VIABLE BONE WITH FATTY MARROW AND AGGREGATES OF ACUTE INFLAMMATORY CELLS CONSISTENT WITH FOCAL ACUTE OSTEOMYELITIS 5.  LEFT 2ND METATARSAL, CLEAN MARGIN:             -  VIABLE AND FOCALLY DEVITALIZED BONE WITH FATTY MARROW AND ACUTE INFLAMMATORY CELLS CONSISTENT WITH FOCAL ACUTE OSTEOMYELITIS 6.  LEFT 3RD METATARSAL CLEAN MARGIN:             - VIABLE BONE WITH FATTY MARROW AND SCATTERED ACUTE INFLAMMATORY CELLS BUT NO EVIDENCE OF OSTEOMYELITIS      - VIABLE SOFT TISSUE WITHOUT ACUTE INFLAMMATION 7.  LEFT 4TH METATARSAL, CLEAN MARGIN:             -  VIABLE BONE WITH FATTY MARROW AND NO EVIDENCE OF OSTEOMYELITIS      - VIABLE SOFT TISSUE WITHOUT ACUTE INFLAMMATION 8.  LEFT 5TH METATARSAL, CLEAN MARGIN:             - VIABLE SOFT TISSUE WITH PERIOSTEAL REACTIVE BONE FORMATION BUT NO MATURE BONE OR INFLAMMATION  9.  LEFT FOOT, TRANSMETATARSAL AMPUTATION:             - THIRD, FOURTH, AND FIFTH TOES WITH ARTICULAR CARTILAGE AND VIABLE BONE WITH FATTY MARROW, RARE ACUTE INFLAMMATORY CELL, BUT NO EVIDENCE OF OSTEOMYELITIS, AND VIABLE SOFT TISSUE WITH VERY FOCAL ACUTE INFLAMMATION       - THREE OF FOUR SEPARATE SPECIMENS EXHIBITING ACUTE OSTEOMYELITIS      - DORSAL AND PLANTER SURGICAL MARGINS EXHIBITING ERODED AND ULCERATED SKIN AND UNDERLYING ACUTE INFLAMMATION Assessment/Plan: 71 y.o. male with S/p left foot transmetatarsal amputation, partially OPEN (DOS: 10/15/2021).  - Dressings changed dakins, DSD- Podiatric surgery will perform dressing changes as indicated- Nursing please reinforce dressing PRN with ABDs, 4x4 gauze, kerlix, ACE- Plan for OR on 10/22/21 for Left foot revisional TMA, delayed primary closure with flap versus graft/VAC application, possible TAL- Consent previously obtained - Will make patient NPO at midnight the night prior to surgery - Will obtain AM labs on day of surgery: CBC, BMP, Coags, T&S- Will hold AC/AP starting today, if medically appropriate and benefits outweighs the risks.- Surgical site to be marked day of surgery - Post-op note to follow after surgery- patient booked for surgery this coming Friday Abx- vanc 10/14/2021-present- zosyn 10/14/2021-present Consult- Medicine: for medical comanagement and pre-op risk stratification- VS: for perfusion assessment; perfusion deemed to be adequate vascular signed off- Infectious Disease: to be consulted after staged procedure for abx guidance  Diet: CCActivity: NWB to LLE, WBAT to RLEDVT Ppx: lovenoxCode: Full Dispo:  OR Friday (10/25/21) for Left foot revisional TMA, delayed primary closure with flap versus graft/VAC application, possible Tendo Achilles lengtheningSeen with attending Dr. Lesle Reek page on-call podiatry [name] with questions:SEE Army Melia FOR CALL Angela Burke, DPM PGY-2 [place] SurgeryElectronically Signed by Donne Hazel, DPM, May 3, 2023I saw and evaluated Edwin [name]. I agree with the findings and the plan of care as documented in the [name] note. I informed the patient that unfortunately the operating room could not give [place] a scheduled time today nor tomorrow regardless of [place] booking the surgery a week ago. He was understanding and is amenable for surgery at my next available OR day. The OR told me they would have the patient scheduled 7:30am this Friday. Clinically, he is doing well.Electronically Signed by Paulene Floor, DPM, 10/23/2021

## 2021-10-24 ENCOUNTER — Inpatient Hospital Stay: Admit: 2021-10-24 | Payer: MEDICARE | Attending: Anesthesiology

## 2021-10-24 DIAGNOSIS — M869 Osteomyelitis, unspecified: Secondary | ICD-10-CM

## 2021-10-24 LAB — CBC WITH AUTO DIFFERENTIAL
BKR WAM ABSOLUTE IMMATURE GRANULOCYTES.: 0.04 x 1000/ÂµL (ref 0.00–0.30)
BKR WAM ABSOLUTE LYMPHOCYTE COUNT.: 2.07 x 1000/ÂµL (ref 0.60–3.70)
BKR WAM ABSOLUTE NRBC (2 DEC): 0 x 1000/ÂµL (ref 0.00–1.00)
BKR WAM ANALYZER ANC: 4.38 x 1000/ÂµL (ref 2.00–7.60)
BKR WAM BASOPHIL ABSOLUTE COUNT.: 0.07 x 1000/ÂµL (ref 0.00–1.00)
BKR WAM BASOPHILS: 0.9 % (ref 0.0–1.4)
BKR WAM EOSINOPHIL ABSOLUTE COUNT.: 0.58 x 1000/ÂµL (ref 0.00–1.00)
BKR WAM EOSINOPHILS: 7.6 % — ABNORMAL HIGH (ref 0.0–5.0)
BKR WAM HEMATOCRIT (2 DEC): 36.9 % — ABNORMAL LOW (ref 38.50–50.00)
BKR WAM HEMOGLOBIN: 11.7 g/dL — ABNORMAL LOW (ref 13.2–17.1)
BKR WAM IMMATURE GRANULOCYTES: 0.5 % (ref 0.0–1.0)
BKR WAM LYMPHOCYTES: 27.2 % (ref 17.0–50.0)
BKR WAM MCH (PG): 25 pg — ABNORMAL LOW (ref 27.0–33.0)
BKR WAM MCHC: 31.7 g/dL (ref 31.0–36.0)
BKR WAM MCV: 78.8 fL — ABNORMAL LOW (ref 80.0–100.0)
BKR WAM MONOCYTE ABSOLUTE COUNT.: 0.48 x 1000/ÂµL (ref 0.00–1.00)
BKR WAM MONOCYTES: 6.3 % (ref 4.0–12.0)
BKR WAM MPV: 8.3 fL (ref 8.0–12.0)
BKR WAM NEUTROPHILS: 57.5 % (ref 39.0–72.0)
BKR WAM NUCLEATED RED BLOOD CELLS: 0 % (ref 0.0–1.0)
BKR WAM PLATELETS: 353 x1000/ÂµL (ref 150–420)
BKR WAM RDW-CV: 14.6 % (ref 11.0–15.0)
BKR WAM RED BLOOD CELL COUNT.: 4.68 M/ÂµL (ref 4.00–6.00)
BKR WAM WHITE BLOOD CELL COUNT: 7.6 x1000/ÂµL (ref 4.0–11.0)

## 2021-10-24 LAB — BASIC METABOLIC PANEL
BKR ANION GAP: 11 (ref 7–17)
BKR BLOOD UREA NITROGEN: 16 mg/dL (ref 8–23)
BKR BUN / CREAT RATIO: 17.8 (ref 8.0–23.0)
BKR CALCIUM: 9.8 mg/dL (ref 8.8–10.2)
BKR CHLORIDE: 107 mmol/L (ref 98–107)
BKR CO2: 20 mmol/L (ref 20–30)
BKR CREATININE: 0.9 mg/dL (ref 0.40–1.30)
BKR EGFR, CREATININE (CKD-EPI 2021): >60 mL/min/{1.73_m2} (ref >=60)
BKR GLUCOSE: 132 mg/dL — ABNORMAL HIGH (ref 70–100)
BKR POTASSIUM: 3.9 mmol/L (ref 3.3–5.3)
BKR SODIUM: 138 mmol/L (ref 136–144)

## 2021-10-24 LAB — MAGNESIUM: BKR MAGNESIUM: 1.8 mg/dL (ref 1.7–2.4)

## 2021-10-24 LAB — VANCOMYCIN, TROUGH: BKR VANCOMYCIN TROUGH: 18.3 ug/mL — ABNORMAL HIGH (ref 10.0–15.0)

## 2021-10-24 MED ORDER — MAGNESIUM SULFATE 2 GRAM/50 ML (4 %) IN WATER INTRAVENOUS PIGGYBACK
2 gram/50 mL (4 %) | Freq: Once | INTRAVENOUS | Status: CP
Start: 2021-10-24 — End: ?
  Administered 2021-10-24: 21:00:00 2 mL/h via INTRAVENOUS

## 2021-10-24 MED ORDER — INSULIN U-100 REGULAR HUMAN 100 UNIT/ML (SLIDING SCALE)
100 unit/mL | Freq: Four times a day (QID) | SUBCUTANEOUS | Status: DC
Start: 2021-10-24 — End: 2021-10-25
  Administered 2021-10-25: 04:00:00 100 unit/mL via SUBCUTANEOUS

## 2021-10-24 MED ORDER — ROSUVASTATIN 10 MG TABLET
10 mg | ORAL | Status: DC
Start: 2021-10-24 — End: 2021-11-02
  Administered 2021-10-26 – 2021-11-01 (×4): 10 mg via ORAL

## 2021-10-24 MED ORDER — VANCOMYCIN MAR LEVEL
Freq: Once | INTRAVENOUS | Status: DC
Start: 2021-10-24 — End: 2021-10-25

## 2021-10-24 MED ORDER — VANCOMYCIN 1 G IN 250 ML IVPB (VIALMATE)
Freq: Two times a day (BID) | INTRAVENOUS | Status: DC
Start: 2021-10-24 — End: 2021-10-31
  Administered 2021-10-24 – 2021-10-31 (×15): 250.000 mL/h via INTRAVENOUS

## 2021-10-24 NOTE — Plan of Care
Problem: Adult Inpatient Plan of CareGoal: Plan of Care ReviewOutcome: Interventions implemented as appropriate Plan of Care Overview/ Patient Status    Patient is A+Ox4, VSS on RA, afebrile, no complaints of pain. Ulcer to left foot - podiatry doing dressing changes in AM. Diabetic ulcer to right foot, aquacell Ag applied, kerlix wrapped around foot to keep aquacell in place (patient and his son asked for Ag pads as that is what they used at home and noticed improvement in ulcer). PIV in left arm, CDI, patent, intermittent IV abx infused throughout day. Patient is continent, SBA w/ RW to bathroom, LBM 5/4. Patient is NWB to LLE, hops on right foot while using walker. Patient is NPO at midnight tonight for procedure in OR tomorrow morning. T&P independently. Safety, fall, and contact precautions maintained. Personal items and call bell placed within reach.Renato Battles, RN

## 2021-10-24 NOTE — Plan of Care
Plan of Care Overview/ Patient Status    Patient alert and oriented x4. VSS on RA. No complaints of pain. Ax1 with RW to bathroom. NWB to LLE. Dressing to LLE with serosanguineous drainage present - dressing changes being performed by podiatry. LBM 5/3. IV ABX continued. T & R independent in bed. Call bell in reach. Fall, safety and contact precautions maintained. Tomi Likens, RN 1:34 AM 10/24/2021

## 2021-10-24 NOTE — Progress Notes
[facility]	Podiatry Progress Note5/4/2023Subjective: Patient seen at bedside this AM, NAD. NAEO.  Return to patient's room this afternoon with attending.  Informed patient that his surgery needs to canceled today due to poor OR availability will be rescheduled for this coming Friday. Denies any pain or n/f/c/vomiting/sob/cp. S/p left foot transmetatarsal amputation, partially OPEN (DOS: 10/15/2021).Objective: VitalsTemp:  [97.1 ?F (36.2 ?C)-98.1 ?F (36.7 ?C)] 98.1 ?F (36.7 ?C)Pulse:  [57-68] 62Resp:  [18-20] 18BP: (121-142)/(71-83) 142/83SpO2:  [97 %-99 %] 97 %Gen: Alert, no distressLeft foot: Dressings intact with strike-through. Upon removal of dressings, surgical site with exposed bone centrally and deeper soft tissue. No oozing noted from medullary canals of bones.  Mild oozing noted from skin edges.  No active bleeders appreciated.  Oozing noted to stop, with compression. No evidence of necrosis. No purulence expressed with manual exsanguination. Moderate peri-surgical site edema and erythema consistent with post-operative changes. No appreciable soft tissue crepitus or fluctuance. Sutures clean, dry, and intact.Grossly unchangedLabs: Recent Labs   05/01/231255 05/01/231637 05/02/230832 05/02/231741 05/03/230754 05/03/231208 05/03/232049 WBC 8.6  --  8.2  --  7.9  --   --  HGB 11.1*  --  11.4*  --  11.3*  --   --  HCT 35.40*  --  34.90*  --  35.00*  --   --  CREATININE  --   --  0.80  --  0.80  --   --  BUN  --   --  14  --  16  --   --  CO2  --   --  20  --  18*  --   --  PLT 363  --  357  --  339  --   --  GLU  --    < > 128*   < > 158*   < > 143* K  --   --  4.3  --  4.2  --   --  NA  --   --  140  --  137  --   --  CL  --   --  110*  --  107  --   --   < > = values in this interval not displayed. Imaging: XR Left Foot: 10/15/2021 FINDINGS:Patient is status post interval transmetatarsal amputation at the level of the mid shaft.Mild degenerative changes are noted in the included midfoot with small ossicles at the dorsal aspect of the talonavicular joint, in keeping with a sequel of prior injury.Soft tissue swelling and air is in keeping with postsurgical changes.IMPRESSION: Post surgical changes, as above.Micro/Path: Micro: 10/15/2021 -  final1. Left 2nd toe wound -  strep viridans2. Left 3rd toe bone -  strep viridans3. Left foot tissue - Pseudomonas aeruginosa, mixed GP/GN organism4. Left 1st metatarsal clean margin -  NGTD5. Left 2nd metatarsal clean margin - NGTD6. Left 3rd metatarsal clean margin - NGTD7. Left 4th metatarsal clean margin- SA8. Left 5th metatarsal clean margin- SA Path: 10/15/2021 - FINAL DIAGNOSIS 1.  LEFT 2ND TOE BONE, BIOPSY:        - ACUTE OSTEOMYELITIS 2.  LEFT 3RD TOE BONE, BIOPSY:             - ARTICULAR CARTILAGE AND VIABLE BONE WITH ACUTE OSTEOMYELITIS AND FEATURES OF OSTEOARTHRITIS      - VIABLE SOFT TISSUE WITH ACUTE AND CHRONIC INFLAMMATION 3.  LEFT FOOT TISSUE, DEBRIDEMENT:             - BONE WITH ACUTE OSTEOMYELITIS AND OVERLYING ACUTELY INFLAMED  SOFT TISSUE 4.  LEFT 1ST METATARSAL, CLEAN MARGIN:             - VIABLE BONE WITH FATTY MARROW AND AGGREGATES OF ACUTE INFLAMMATORY CELLS CONSISTENT WITH FOCAL ACUTE OSTEOMYELITIS 5.  LEFT 2ND METATARSAL, CLEAN MARGIN:             - VIABLE AND FOCALLY DEVITALIZED BONE WITH FATTY MARROW AND ACUTE INFLAMMATORY CELLS CONSISTENT WITH FOCAL ACUTE OSTEOMYELITIS 6.  LEFT 3RD METATARSAL CLEAN MARGIN:             - VIABLE BONE WITH FATTY MARROW AND SCATTERED ACUTE INFLAMMATORY CELLS BUT NO EVIDENCE OF OSTEOMYELITIS      - VIABLE SOFT TISSUE WITHOUT ACUTE INFLAMMATION 7.  LEFT 4TH METATARSAL, CLEAN MARGIN:             -  VIABLE BONE WITH FATTY MARROW AND NO EVIDENCE OF OSTEOMYELITIS      - VIABLE SOFT TISSUE WITHOUT ACUTE INFLAMMATION 8.  LEFT 5TH METATARSAL, CLEAN MARGIN:             - VIABLE SOFT TISSUE WITH PERIOSTEAL REACTIVE BONE FORMATION BUT NO MATURE BONE OR INFLAMMATION  9.  LEFT FOOT, TRANSMETATARSAL AMPUTATION:             - THIRD, FOURTH, AND FIFTH TOES WITH ARTICULAR CARTILAGE AND VIABLE BONE WITH FATTY MARROW, RARE ACUTE INFLAMMATORY CELL, BUT NO EVIDENCE OF OSTEOMYELITIS, AND VIABLE SOFT TISSUE WITH VERY FOCAL ACUTE INFLAMMATION       - THREE OF FOUR SEPARATE SPECIMENS EXHIBITING ACUTE OSTEOMYELITIS      - DORSAL AND PLANTER SURGICAL MARGINS EXHIBITING ERODED AND ULCERATED SKIN AND UNDERLYING ACUTE INFLAMMATION Assessment/Plan: 71 y.o. male with S/p left foot transmetatarsal amputation, partially OPEN (DOS: 10/15/2021).  - Dressings changed dakins, DSD- Podiatric surgery will perform dressing changes as indicated- Nursing please reinforce dressing PRN with ABDs, 4x4 gauze, kerlix, ACE- Plan for OR on 10/22/21 for Left foot revisional TMA, delayed primary closure with flap versus graft/VAC application, possible TAL- Consent previously obtained - Will make patient NPO at midnight the night prior to surgery - Will obtain AM labs on day of surgery: CBC, BMP, Coags, T&S- Will hold AC/AP starting today, if medically appropriate and benefits outweighs the risks.- Surgical site to be marked day of surgery - Post-op note to follow after surgery- patient booked for surgery this coming Friday Abx- vanc 10/14/2021-present- zosyn 10/14/2021-present Consult- Medicine: for medical comanagement and pre-op risk stratification- VS: for perfusion assessment; perfusion deemed to be adequate vascular signed off- Infectious Disease: to be consulted after staged procedure for abx guidance  Diet: CCActivity: NWB to LLE, WBAT to RLEDVT Ppx: lovenoxCode: Full Dispo:  OR Friday (10/25/21) for Left foot revisional TMA, delayed primary closure with flap versus graft/VAC application, possible Tendo Achilles lengtheningAttending, Dr. Lesle Reek page on-call podiatry resident with questions:SEE Army Melia FOR CALL Angela Burke, DPM PGY-2 [facility] SurgeryElectronically Signed by Donne Hazel, DPM, May 4, 2023Discussed with resident, agree with plan. Electronically Signed by Paulene Floor, DPM, Oct 24, 2021

## 2021-10-24 NOTE — ED Provider Notes
Chief Complaint Patient presents with ? Foot Ulcer   71 year old male in  from [place] working, has ulcer to bottom of left foot. Started new medication Sat night. Had fever yesterday, no fever today. Wound has bloody/clear drainage, warm to touch. Hx of DM PGY-4 Emergency Medicine Resident MDM: ----------------------------------------------------------------------------Presentation:  Pt is a 71 y.o. male w pmh osteomyelitis in Jan/Feb this year requiring daily IV abx per the patient, DM II, neuropathy, vascular stents, and multiple toe amputations presents with worsening left foot ulcer on plantar aspect. Associated with fever 100.3F yesterday and chills, increased fluid discharge from ulcer and blistering on the dorsal aspect. PE:A&Ox4, PERRLALungs clear to auscultation bilaterallyRRR, no murmurs heard. Radial pulses 2+ and equal bilaterally. DPs 2+ and equal bilaterally. No LE edemaAbdomen soft NTTP, non peritonealLeft foot with first two toes amputated. Deep ulcer on plantar aspect with serous fluid discharge. Erythema to dorsal aspect of foot. No crepitus. Please see picturesConcern for cellulitis v nec fasc, will order XR. Concern for infection/metabolic derangement,  will order labs to r/o. Will start on vanc + zosynConsidered DVT, poor arterial perfusion, but unlikely given examRe-eval: Workup remarkable for osteomyelitis, leukocytosis. Spoke with podiatry, they will see the patient in the ERDispo: Admit to podietryThis patient was presented to and discussed with the attending and a treatment plan and disposition were collaboratively agreed upon.Donnie Mesa, M.D.Emergency MedicinePGY-44:25 PM---------------------------------------------------------------------------------------------------------------------MDM  Physical ExamED Triage Vitals [10/14/21 1602]BP: 136/82Pulse: 82Pulse from  O2 sat: n/aResp: 18Temp: 98 ?F (36.7 ?C)Temp src: OralSpO2: 98 % BP 130/75  - Pulse 67  - Temp 97.7 ?F (36.5 ?C) (Oral)  - Resp 18  - Ht 5' 11 (1.803 m)  - Wt 89.1 kg  - SpO2 99%  - BMI 27.41 kg/m? Physical ExamVitals reviewed. Constitutional:     General: He is not in acute distress.   Appearance: He is well-developed. He is not diaphoretic. HENT:    Head: Normocephalic.    Right Ear: External ear normal.    Left Ear: External ear normal.    Nose: Nose normal. Eyes:    General:       Right eye: No discharge.       Left eye: No discharge.    Extraocular Movements: Extraocular movements intact.    Conjunctiva/sclera: Conjunctivae normal. Cardiovascular:    Rate and Rhythm: Normal rate. Pulmonary:    Effort: Pulmonary effort is normal. No respiratory distress.    Breath sounds: Normal breath sounds. Abdominal:    General: Bowel sounds are normal.    Palpations: Abdomen is soft. Musculoskeletal:       General: Normal range of motion.    Cervical back: Normal range of motion and neck supple.      Feet:Skin:   General: Skin is warm and dry.    Capillary Refill: Capillary refill takes less than 2 seconds. Neurological:    Mental Status: He is alert and oriented to person, place, and time. Psychiatric:       Behavior: Behavior normal.  ProceduresAttestation/Critical CarePatient Reevaluation: Attending Supervised: ResidentI saw and examined the patient. I agree with the findings and plan of care as documented in the resident's note except as noted below. Additional acute and/or chronic problems addressed:  Patient presents to the ED with concern for infection of his left foot.  History of diabetes, neuropathy, vascular disease, osteomyelitis.  [place] from out of [place] and so we do not have his medical records.  He has an ulcer to the plantar aspect of his LEs  foot and measured a fever of 100.5 at home and so came for evaluation.  He denies knee pain, traumaOn exam afebrile, awake and alert, regular rate and rhythm, lungs clear, left foot with plantar ulcer as photographed as well as redness warmth fluctuance. Assessment and planOsteomyelitis --> labs, xrayNSTI --> xrayEmpiric abxPodiatry consultNo gas on independent review of x-ray. Radiology read of x-ray concerning for osteomyelitisPatient discussed with podiatry.  Plan to admit for ongoing management of osteomyelitisJessica D BodClinical Impressions as of 10/24/21 1433 Osteomyelitis (HC Code) Osteomyelitis, unspecified site, unspecified type (HC Code) (HC CODE) (HC Code) Septic arthritis of left foot, due to unspecified organism [facility] Code) [facility] CODE) [facility] Code)  ED DispositionAdmit Valentino Hue, MDResident04/24/23 [address], MD05/04/23 1435

## 2021-10-24 NOTE — Other
VANCOMYCIN LEVEL EVALUATIONCurrent Vancomycin Order: 1.25 g IV every 12 hours. Day of Therapy: 11Vancomycin Indication: MRSA in DWCx (in addition to S viridans, PsA)Estimated Creatinine Clearance: 83 mL/min (by C-G formula based on SCr of 0.9 mg/dL).Creatinine (mg/dL) Date Value 16/03/9603 0.90 10/23/2021 0.80 10/22/2021 0.80  Renal Function: StableLevel: Lab Results Last 72 Hours Component Value Date/Time  Vancomycin Trough 18.3 (H) 10/24/2021 07:11 AM  Vancomycin Trough 18.3 (H) 10/21/2021 04:17 PM Type of Level: Trough; Level drawn appropriately? YesLevel obtained 13.3h post-doseBased on vancomycin level obtained, recommend:  Decrease to 1000 mg every 12 hoursRepeat Level: 10/25/21 at 2030ID/AST Consulted? No [facility] Vancomycin Dosing GuidelineBH Vancomycin Dosing GuidelineFor questions, please contact the pharmacist: Dawayne Cirri, PharmD    Phone/Mobile Heartbeat: [facility]

## 2021-10-24 NOTE — Other
Medicine Consult Note Hospital Day: 8SubjectivePatient seen at bedside this AM in NAD. Feeling well overall this morning. He reports a normal bowel movement yesterday evening. He denies abdominal pain, diarrhea and constipation. He denies nausea, vomiting, SOB, and chest pain. He expresses appreciation in his care by all teams.  ObjectiveVitals:  10/23/21 0354 BP: (!) 145/85 Pulse: 72 Resp: 18 Temp: 97.6 ?F (36.4 ?C)  Exam: Gen: Well appearing male in NAD seen resting comfortably in bed. CV: RRR, no murmur or gallopPulm: No increased WOB, CTABLE: No pitting edema to BLE. Skin: Mild scaling on the dorsal hand bilaterally with continued improvement in erythema. Labs notable for:Cr 0.8 todayMg 1.9 this morning Vit B12 217Iron studies:Iron 30, TIBC 199, iron sat 15%, ferritin 126 Micro:4/24 deep wound +MRSA and pansensitive pseudomonasPath:4/25Left foot with residual OM at 1st and 2nd metatarsal clean marginsAssessment & Plan54 y.o. year old M with history significant for controlled DM, CAD s/p remote stents and CABG 2022, with prior toe amputations iso neuropathy and likely vascular disease who is s/p L foot debridement of abscess, I&D, and ultimately TMA on 4/25 with plan for closure on 5/2. Medicine is consulted for co-management. # foot wounds, s/p L TMA- Abx per podiatry	- Vanc/Zosyn 4/24 - present- Pain control per podiatry- Plan now for OR this Friday 10/25/2021 with podiatry for delayed primary closure- Lovenox 40 QD for vte ppx #microcytic anemiaFound to have B12 deficiency- cont B12 PO supplementation 1000mg  daily#T2DM - A1c 6.8 06/2021- BG well controlled on current ssi #CADS/p CABG Jan 2022. Reports recovering well- Continue ASA 81 mg QD for now (home 325) - cont. metoprolol tartrate 25mg  BID- cont. rosuvastatin 10mg  qd to target LDL < 70 for secondary prevention, will continue to monitor for myalgias#Sunburn - photosensitivity iso doxycycline- Lubriderm ordered for bilateral hands#Depression- Lexapro 10mg QDElectronically Signed by Cleaster Corin, DPM, May 3, 2023Plans subject to change with attending's addendum.

## 2021-10-25 ENCOUNTER — Inpatient Hospital Stay: Admit: 2021-10-25 | Payer: MEDICARE | Attending: Anesthesiology

## 2021-10-25 ENCOUNTER — Inpatient Hospital Stay: Admit: 2021-10-25 | Payer: MEDICARE

## 2021-10-25 DIAGNOSIS — M869 Osteomyelitis, unspecified: Secondary | ICD-10-CM

## 2021-10-25 LAB — CBC WITH AUTO DIFFERENTIAL
BKR WAM ABSOLUTE IMMATURE GRANULOCYTES.: 0.05 x 1000/ÂµL (ref 0.00–0.30)
BKR WAM ABSOLUTE LYMPHOCYTE COUNT.: 1.97 x 1000/ÂµL (ref 0.60–3.70)
BKR WAM ABSOLUTE NRBC (2 DEC): 0 x 1000/ÂµL (ref 0.00–1.00)
BKR WAM ANALYZER ANC: 4.27 x 1000/ÂµL (ref 2.00–7.60)
BKR WAM BASOPHIL ABSOLUTE COUNT.: 0.04 x 1000/ÂµL (ref 0.00–1.00)
BKR WAM BASOPHILS: 0.6 % (ref 0.0–1.4)
BKR WAM EOSINOPHIL ABSOLUTE COUNT.: 0.29 x 1000/ÂµL (ref 0.00–1.00)
BKR WAM EOSINOPHILS: 4 % (ref 0.0–5.0)
BKR WAM HEMATOCRIT (2 DEC): 37.4 % — ABNORMAL LOW (ref 38.50–50.00)
BKR WAM HEMOGLOBIN: 12.1 g/dL — ABNORMAL LOW (ref 13.2–17.1)
BKR WAM IMMATURE GRANULOCYTES: 0.7 % (ref 0.0–1.0)
BKR WAM LYMPHOCYTES: 27.4 % (ref 17.0–50.0)
BKR WAM MCH (PG): 25.4 pg — ABNORMAL LOW (ref 27.0–33.0)
BKR WAM MCHC: 32.4 g/dL (ref 31.0–36.0)
BKR WAM MCV: 78.6 fL — ABNORMAL LOW (ref 80.0–100.0)
BKR WAM MONOCYTE ABSOLUTE COUNT.: 0.56 x 1000/ÂµL (ref 0.00–1.00)
BKR WAM MONOCYTES: 7.8 % (ref 4.0–12.0)
BKR WAM MPV: 8.1 fL (ref 8.0–12.0)
BKR WAM NEUTROPHILS: 59.5 % (ref 39.0–72.0)
BKR WAM NUCLEATED RED BLOOD CELLS: 0 % (ref 0.0–1.0)
BKR WAM PLATELETS: 333 x1000/ÂµL (ref 150–420)
BKR WAM RDW-CV: 14.6 % (ref 11.0–15.0)
BKR WAM RED BLOOD CELL COUNT.: 4.76 M/ÂµL (ref 4.00–6.00)
BKR WAM WHITE BLOOD CELL COUNT: 7.2 x1000/ÂµL (ref 4.0–11.0)

## 2021-10-25 LAB — BASIC METABOLIC PANEL
BKR ANION GAP: 10 (ref 7–17)
BKR BLOOD UREA NITROGEN: 14 mg/dL (ref 8–23)
BKR BUN / CREAT RATIO: 17.5 (ref 8.0–23.0)
BKR CALCIUM: 9.7 mg/dL (ref 8.8–10.2)
BKR CHLORIDE: 109 mmol/L — ABNORMAL HIGH (ref 98–107)
BKR CO2: 21 mmol/L (ref 20–30)
BKR CREATININE: 0.8 mg/dL (ref 0.40–1.30)
BKR EGFR, CREATININE (CKD-EPI 2021): >60 mL/min/{1.73_m2} (ref >=60)
BKR GLUCOSE: 140 mg/dL — ABNORMAL HIGH (ref 70–100)
BKR POTASSIUM: 4.3 mmol/L (ref 3.3–5.3)
BKR SODIUM: 140 mmol/L (ref 136–144)

## 2021-10-25 LAB — MAGNESIUM: BKR MAGNESIUM: 1.9 mg/dL (ref 1.7–2.4)

## 2021-10-25 MED ORDER — INSULIN LISPRO 100 UNIT/ML (SLIDING SCALE)
100 unit/mL | Freq: Three times a day (TID) | SUBCUTANEOUS | Status: DC
Start: 2021-10-25 — End: 2021-10-29
  Administered 2021-10-26 – 2021-10-29 (×4): 100 unit/mL via SUBCUTANEOUS

## 2021-10-25 MED ORDER — ONDANSETRON HCL (PF) 4 MG/2 ML INJECTION SOLUTION
4 mg/2 mL | INTRAVENOUS | Status: DC | PRN
Start: 2021-10-25 — End: 2021-10-25

## 2021-10-25 MED ORDER — FENTANYL (PF) 50 MCG/ML INJECTION SOLUTION
50 mcg/mL | Status: CP
Start: 2021-10-25 — End: ?

## 2021-10-25 MED ORDER — OXYCODONE IMMEDIATE RELEASE 5 MG TABLET
5 mg | Freq: Four times a day (QID) | ORAL | Status: DC | PRN
Start: 2021-10-25 — End: 2021-11-02
  Administered 2021-10-26 – 2021-10-27 (×3): 5 mg via ORAL

## 2021-10-25 MED ORDER — HYDROMORPHONE 0.5 MG/0.5 ML INJECTION SYRINGE
0.5 mg/ mL | INTRAVENOUS | Status: DC | PRN
Start: 2021-10-25 — End: 2021-10-25

## 2021-10-25 MED ORDER — LIDOCAINE (PF) 20 MG/ML (2 %) INJECTION SOLUTION
20 mg/mL (2 %) | INTRAVENOUS | Status: DC | PRN
Start: 2021-10-25 — End: 2021-10-25
  Administered 2021-10-25: 12:00:00 20 mg/mL (2 %) via INTRAVENOUS

## 2021-10-25 MED ORDER — PHENYLEPHRINE 1 MG/10 ML (100 MCG/ML) IN 0.9 % SOD.CHLORIDE IV SYRINGE
1 mg/0 mL (00 mcg/mL) | INTRAVENOUS | Status: DC | PRN
Start: 2021-10-25 — End: 2021-10-25
  Administered 2021-10-25 (×5): 1 mg/0 mL (00 mcg/mL) via INTRAVENOUS

## 2021-10-25 MED ORDER — EPHEDRINE SULFATE 5 MG/ML INTRAVENOUS SOLUTION
5 mg/mL | INTRAVENOUS | Status: DC | PRN
Start: 2021-10-25 — End: 2021-10-25
  Administered 2021-10-25 (×2): 5 mg/mL via INTRAVENOUS

## 2021-10-25 MED ORDER — LACTATED RINGERS INTRAVENOUS SOLUTION
INTRAVENOUS | Status: DC | PRN
Start: 2021-10-25 — End: 2021-10-25
  Administered 2021-10-25: 12:00:00 via INTRAVENOUS

## 2021-10-25 MED ORDER — NALOXONE 0.4 MG/ML INJECTION SOLUTION
0.4 mg/mL | INTRAVENOUS | Status: DC | PRN
Start: 2021-10-25 — End: 2021-10-25

## 2021-10-25 MED ORDER — VANCOMYCIN MAR LEVEL
Freq: Once | INTRAVENOUS | Status: CP
Start: 2021-10-25 — End: ?

## 2021-10-25 MED ORDER — FENTANYL (PF) 50 MCG/ML INJECTION SOLUTION
50 mcg/mL | INTRAVENOUS | Status: DC | PRN
Start: 2021-10-25 — End: 2021-10-25
  Administered 2021-10-25: 12:00:00 50 mcg/mL via INTRAVENOUS

## 2021-10-25 MED ORDER — PROPOFOL 10 MG/ML INTRAVENOUS EMULSION
10 mg/mL | INTRAVENOUS | Status: DC | PRN
Start: 2021-10-25 — End: 2021-10-25
  Administered 2021-10-25: 12:00:00 10 mg/mL via INTRAVENOUS

## 2021-10-25 MED ORDER — VASOPRESSIN 20 UNIT/ML INTRAVENOUS SOLUTION
20 unit/mL | INTRAVENOUS | Status: DC | PRN
Start: 2021-10-25 — End: 2021-10-25
  Administered 2021-10-25: 12:00:00 20 unit/mL via INTRAVENOUS

## 2021-10-25 MED ORDER — HYDROMORPHONE (PF) 0.2 MG/ML INJECTION SYRINGE
0.2 mg/mL | INTRAVENOUS | Status: DC | PRN
Start: 2021-10-25 — End: 2021-10-25

## 2021-10-25 MED ORDER — ONDANSETRON HCL (PF) 4 MG/2 ML INJECTION SOLUTION
4 mg/2 mL | INTRAVENOUS | Status: DC | PRN
Start: 2021-10-25 — End: 2021-10-25
  Administered 2021-10-25: 13:00:00 4 mg/2 mL via INTRAVENOUS

## 2021-10-25 MED ORDER — VASOPRESSIN 20 UNIT/ML INTRAVENOUS SOLUTION
20 unit/mL | Status: CP
Start: 2021-10-25 — End: ?

## 2021-10-25 MED ORDER — BUPIVACAINE (PF) 0.5 % (5 MG/ML) INJECTION SOLUTION
0.5 % (5 mg/mL) | Status: DC | PRN
Start: 2021-10-25 — End: 2021-10-25
  Administered 2021-10-25: 12:00:00 0.5 % (5 mg/mL)

## 2021-10-25 MED ORDER — KETOROLAC 30 MG/ML (1 ML) INJECTION SOLUTION
30 mg/mL (1 mL) | INTRAVENOUS | Status: DC | PRN
Start: 2021-10-25 — End: 2021-10-25
  Administered 2021-10-25: 13:00:00 30 mg/mL (1 mL) via INTRAVENOUS

## 2021-10-25 MED ORDER — SODIUM CHLORIDE 0.9 % IRRIGATION SOLUTION
0.9 % irrigation | Status: CP | PRN
Start: 2021-10-25 — End: ?
  Administered 2021-10-25: 11:00:00 0.9 % irrigation

## 2021-10-25 MED ORDER — BUPIVACAINE (PF) 0.5 % (5 MG/ML) INJECTION SOLUTION
0.5 % (5 mg/mL) | Status: DC
Start: 2021-10-25 — End: 2021-11-02

## 2021-10-25 MED ORDER — PROPOFOL 10 MG/ML INTRAVENOUS EMULSION
10 mg/mL | INTRAVENOUS | Status: DC | PRN
Start: 2021-10-25 — End: 2021-10-25
  Administered 2021-10-25: 12:00:00 10 mL/h via INTRAVENOUS

## 2021-10-25 MED ORDER — INSULIN LISPRO 100 UNIT/ML (SLIDING SCALE)
100 unit/mL | Freq: Every evening | SUBCUTANEOUS | Status: DC
Start: 2021-10-25 — End: 2021-10-29

## 2021-10-25 MED ORDER — FENTANYL (PF) 50 MCG/ML INJECTION SOLUTION
50 mcg/mL | INTRAVENOUS | Status: DC | PRN
Start: 2021-10-25 — End: 2021-10-25

## 2021-10-25 NOTE — Plan of Care
Problem: Adult Inpatient Plan of CareGoal: Plan of Care ReviewOutcome: Interventions implemented as appropriate Plan of Care Overview/ Patient Status    Patient is AxOx4, VSS, afebrile, RA. HOH. Contact precautions maintained. NPO @ MN for procedure. Meds and nursing care explained, patient verbalized understanding. IV abx give per [facility]. No complaints of pain at this time. Patient SBA with RW, NWB to LLE. Continent of both, OOB to toilet. Urinal at bedside. Last BM 5/5. Independent with TxP. Dressings to LLE CDI, changes by podiatry. RLE wound with zeroform and kerlix per pt's preference. Skin tear to R forearm OTA. Sunburn to L hand, OTA, eucerin cream at bedside. Safety measures maintained. Call bell within reach. Bed alarm on. See flowsheets for complete assessment. Continue to monitor. 0981 - pt left for the OR. Jeannie Fend, RN

## 2021-10-25 NOTE — Care Coordination-Inpatient
This Clinical research associate called pt wife [name] to discuss dc plans and to inform her PT has recommended home with PT and OT.[name] stated that pt will return home in [place] when discharge.CM notified.Care Management will continue to follow.Cordarryl Monrreal NevinTransition CoordinatorYale LaGrange HospitalCare Management ST. Raphael CampusCell#[phone] [phone].[email or web address]

## 2021-10-25 NOTE — Care Coordination-Inpatient
I identified my role as TC from Care Management department.  Explained Medicare Important Message to patient over the phone. Patient stated understanding of the notice and was in agreement with the discharge plan. Patient advised of the right to call the [employer]) at [phone] if not in agreement with the discharge from the hospital. Copy mailed/routed to MyChart to patient after confirming address.Devin Foskey NevinTransition CoordinatorYale Cayuse HospitalCare Management ST. Raphael CampusCell#[phone] [phone].[email or web address]

## 2021-10-25 NOTE — Anesthesia Post-Procedure Evaluation
Anesthesia Post-op NotePatient: Marks C HeathProcedure(s):  Procedure(s) (LRB):LEFT FOOT METATARSAL RESECTIONS 1, 2, 3, 4, 5 WITH COMPLED DELAYED PRIMARY CLOSURE AND TENDO ACHILLES LENGTHENING. (Left)METATARSAL RESECTIONS 3 AND 4 (Left)SECONDARY CLOSURE, SURGICAL WOUND/DEHISCENCE, EXTENSIVE/COMPLICATED (Left)LENGTHENING/SHORTENING, TENDON, LEG/ANKLE; SINGLE TENDON (SEP PROC) (Left) Patient location: PACULast Vitals:  I have noted the vital signs as listed in the nursing notes.Mental status recovered: patient participates in evaluation: YesVital signs reviewed: YesRespiratory function stable:Airway is patent: YesCardiovascular function and hydration status stable: YesPain control satisfactory: YesNausea and vomiting control satisfactory:YesThere were no known notable events for this encounter.

## 2021-10-25 NOTE — Anesthesia Pre-Procedure Evaluation
This is a 71 y.o. male scheduled for LEFT FOOT TRANSMETATARSAL AMPUTATION, POSS APPLICATION OF BIOLOGICAL GRAFT AND WOUND VAC (Left).Review of Systems/ Medical HistoryPatient summary, nursing notes, EKG/Cardiac Studies , Labs, pre-procedure vitals, height, weight and NPO status reviewed.No previous anesthesia concernsAnesthesia Evaluation: Estimated body mass index is 27.41 kg/m? as calculated from the following:  Height as of this encounter: 5' 11 (1.803 m).  Weight as of this encounter: 89.1 kg. Cardiovascular:Patient has a history of: hypertension. -Exercise tolerance: <4 METS -Coronary Artery Disease: CABG and cardiac stents Endocrine/Metabolic: -Diabetes mellitus:  Patient has diabetes mellitus.Physical ExamCardiovascular:    Rhythm: regularHeart Sounds: S1 present and S2 present.Pulmonary:  Patient's breath sounds clear to auscultationAirway:  Mallampati: IITM distance: >3 FBNeck ROM: fullMouth Opening: >3cmDental:  unremarkable  Anesthesia PlanASA 3 The primary anesthesia plan is  MAC. Perioperative Code Status confirmed: It is my understanding that the patient is currently designated as 'Full Code' and will remain so throughout the perioperative period.Anesthesia informed consent obtained. Consent obtained from: patientUse of blood products: consented  The post operative pain plan is per surgeon management.Plan discussed with CRNA.Anesthesiologist's Pre Op NoteI personally evaluated and examined the patient prior to the intra-operative phase of care on the day of the procedure.Marland Kitchen

## 2021-10-25 NOTE — Brief Op Note
[facility] HealthPatient Name: Edwin [name]        [id number] Patient DOB: [birth year]     Surgery Date: 5/5/2023Surgeon(s) and Role:   * Paulene Floor, DPM - Primary   * Gazes, Casimiro Needle, DPM - AssistingAssistant(s):Resident: Heidi Dach., DPMStaff:  Circulator: Manson Passey, RN; Conley Canal, RNScrub Person: Dareen Piano, Lavonna Monarch; Kashur, TiffanyPre-Op Diagnosis: Chronic osteomyelitis of hindfoot, left (HC Code) (HC CODE) (HC Code) [W09.811] Procedure(s) and Anesthesia Type:   * LEFT FOOT METATARSAL RESECTIONS 1, 2, 3, 4, 5 WITH COMPLED DELAYED PRIMARY CLOSURE AND TENDO ACHILLES LENGTHENING. - MONITORED ANESTHESIA CARE   * METATARSAL RESECTIONS 3 AND 4 - MONITORED ANESTHESIA CARE   * SECONDARY CLOSURE, SURGICAL WOUND/DEHISCENCE, EXTENSIVE/COMPLICATED - MONITORED ANESTHESIA CARE   * LENGTHENING/SHORTENING, TENDON, LEG/ANKLE; SINGLE TENDON (SEP PROC) - MONITORED ANESTHESIA CAREOperative Findings (enter relevant operative findings; do not refer to an operative report that is not yet transcribed): Left foot with previous TMA with sutures intact medially and laterally and full thickness granular wound centrally.No purulence expressed from dissection or upon incision. Bone stock distal to amputation site soft. Bone stock proximal to amputation site hard. Bleeding intraoperatively was acceptable. Upon closure, flaps noted to have capillary refill time within normal limits.Signs of infection present at the time of surgery at the operative site: None Blood and Blood Products: none                 Drains:  noneImplants: * No implants in log * Specimens: ID Type Source Tests Collected by Time 1 : LEFT 1ST METATARSAL CLEAN MARGIN #2 Bone Foot, Left PATHOLOGY [facility]) Paulene Floor, DPM 10/25/2021  7:45 AM 2 : LEFT 2ND METATARSAL CLEAN MARGIN #2 Bone Foot, Left PATHOLOGY ([facility]) Paulene Floor, DPM 10/25/2021  7:46 AM 3 : LEFT 4TH METATARSAL CLEAN MARGIN #2 Bone Foot, Left PATHOLOGY ([facility]) Paulene Floor, DPM 10/25/2021  7:46 AM 4 : LEFT 5TH METATARSAL CLEAN MARGIN #2 Bone Foot, Left PATHOLOGY ([facility]) Paulene Floor, DPM 10/25/2021  7:47 AM 5 : LEFT FOOT METATARSAL RESECTION Gross Only Foot, Left PATHOLOGY ([facility]) Paulene Floor, DPM 10/25/2021  8:11 AM  Intra-op Labs (16h ago, onward)   Start     Ordered  10/25/21 0800  Deep wound culture     ([facility])  [[id number]]  Once      Question Answer Comment Specimen site/method Foot LT  Specimen Site Details (26 character limit): LEFT 1ST METATARSAL CLEAN MARGIN#2  Lab Specific Advisory Note: DO NOT PLACE ORDERS IN COMMENT FIELD  Release to patient Immediate    10/25/21 0800  10/25/21 0758  Deep wound culture     ([facility])  [[id number]]  Once      Question Answer Comment Specimen site/method Foot LT  Specimen Site Details (26 character limit): LEFT 5TH METATARSAL CLEAN MARGIN #2  Lab Specific Advisory Note: DO NOT PLACE ORDERS IN COMMENT FIELD  Release to patient Immediate    10/25/21 0757  10/25/21 0753  Deep wound culture     ([facility])  [[id number]]  Once      Question Answer Comment Specimen site/method Foot LT  Specimen Site Details (26 character limit): LEFT 4TH METATARSAL CLEAN MARGIN #2  Lab Specific Advisory Note: DO NOT PLACE ORDERS IN COMMENT FIELD  Release to patient Immediate    10/25/21 0756  10/25/21 0752  Deep wound culture     ([facility])  [[id number]]  Once  Question Answer Comment Specimen site/method Foot LT  Specimen Site Details (26 character limit): LEFT 2ND METATARSAL CLEAN MARGIN #2  Lab Specific Advisory Note: DO NOT PLACE ORDERS IN COMMENT FIELD  Release to patient Immediate    10/25/21 0752    EBL: 50 mL      Post Operative Diagnosis: same Rafi M Fasihuddin, DPM5/5/20238:58 AM

## 2021-10-25 NOTE — Other
PACU to Floor Nursing Transfer NotePreop Diagnosis: Chronic osteomyelitis of hindfoot, left (HC Code) (HC CODE) (HC Code) [M86.672]Procedure Done: *LEFT FOOT METATARSAL RESECTIONS 1, 2, 3, 4, 5 WITH COMPLED DELAYED PRIMARY CLOSURE AND TENDO ACHILLES LENGTHENING. (Left)METATARSAL RESECTIONS 3 AND 4 (Left)SECONDARY CLOSURE, SURGICAL WOUND/DEHISCENCE, EXTENSIVE/COMPLICATED (Left)LENGTHENING/SHORTENING, TENDON, LEG/ANKLE; SINGLE TENDON (SEP PROC) (Left) ?Nerve Block: N/AAny Significant Events Intra-Op: noneAbnormal Assessment in PACU: noneLevel of Consciousness: alert Last Set of VS:  Vitals:  10/25/21 0353 10/25/21 0900 10/25/21 0915 10/25/21 0917 BP: 113/77 (!) 89/55 (!) 144/71 122/82 Pulse: (!) 56 79 63 79 Resp: 18 14 (!) 11 16 Temp: 97.7 ?F (36.5 ?C) 97.8 ?F (36.6 ?C)  97.9 ?F (36.6 ?C) TempSrc: Oral   Temporal SpO2: 97% 95% 98% 95% Weight:     Height:     Device (Oxygen Therapy): room airOxygen Concentration (%): 95O2 Flow (L/min): 0Baseline Neuro/developmental Status: WDLLabs Collected: NoSpecial Needs of the Patient: noneAntibiotics: Pt arrived from floor with antibiotics running, nothing given in the ORIV Access: Periph IV 10/24/21 1130 dorsum left arm over-the-needle catheter system 22 gauge;1 in length (Active) SiteCare/Dressing Status/Securement dressing dry and intact 10/24/21 2000 Date Dressing Applied/Changed 10/24/21 10/24/21 1130 Next Date Dressing Change 10/31/2021 10/24/21 1130 Lumen 1 Patency/Care Patent;flushed w/o difficulty 10/24/21 2000 Site Signs asymptomatic without redness, warmth, swelling, pain, palpable cord, streak formation, drainage, dressing intact 10/24/21 2000 Phlebitis 0-->no symptoms 10/24/21 2000 Infiltration/Extravasation Assessment 0-->no symptoms 10/24/21 2000 Patient Education Instructed to keep IV site dry;Instructed to call nurse if site is painful,red,swollen, burning;Instructed to call nurse if fluid leaking from site 10/24/21 2000 Daily Review of Necessity ** completed 10/24/21 2000 IV Fluids:   Number Scale Pain Rating 0: 0-->no pain     Body Position: supine, independentHead of Bed (HOB) Positioning: HOB elevated   Time of Last Void or Time that Urinary Catheter was Removed Intra-Op: has not voided Additional Info: Pt arrived from the OR alert and without complaints of pain.  Left foot with bulky dressing intact, without evidence of drainage.  BP on arrival was low and pt was treated by anesthesia with IVF and meds with return of BP to baseline.  Vitals stable. Taking PO fluids. Contact RN (name and phone number): Forest Becker RN PACU [phone]

## 2021-10-25 NOTE — Other
Post Anesthesia Transfer of Care NotePatient: Edwin C HeathProcedure(s) Performed: Procedure(s) (LRB):LEFT FOOT METATARSAL RESECTIONS 1, 2, 3, 4, 5 WITH COMPLED DELAYED PRIMARY CLOSURE AND TENDO ACHILLES LENGTHENING. (Left)METATARSAL RESECTIONS 3 AND 4 (Left)SECONDARY CLOSURE, SURGICAL WOUND/DEHISCENCE, EXTENSIVE/COMPLICATED (Left)LENGTHENING/SHORTENING, TENDON, LEG/ANKLE; SINGLE TENDON (SEP PROC) (Left) Patient location: PACU Last Vitals: Vitals Value Taken Time BP 122/82 10/25/21 0917 Temp 36.6 ?C 10/25/21 0917 Pulse 69 10/25/21 0918 Resp 15 10/25/21 0918 SpO2 99 % 10/25/21 0918 Vitals shown include unvalidated device data.Level of consciousness: awakeTransport Vital Signs:  Stable since the last set of recorded intra-operative vital signsIntra-operative Complications: noneIntra-operative Intake & Output and Antibiotics as per Anesthesia record and discussed with the RN.

## 2021-10-25 NOTE — Plan of Care
Problem: Adult Inpatient Plan of CareGoal: Plan of Care ReviewOutcome: Interventions implemented as appropriate Plan of Care Overview/ Patient Status    Patient is A+Ox4, VSS on RA, afebrile, no complaints of pain. Ulcer left foot, procedure today, currently dressed and podiatry still doing dressing changes. Ulcer to right foot, small piece of aquacell Ag in place, secured with kerlix and tape. TMA all toes right foot. Patient has neuropathy in bilateral feet. BP elevated at times, MD aware, no new orders. Continent, goes to bathroom SBA w/RW, NWB LLE, hops on right foot. LBM 5/5. Intermittent vanco and zosyn infusions administered per [facility] throughout day. Safety and fall precautions maintained. Personal items and call bell placed within reach.Renato Battles, RN

## 2021-10-25 NOTE — Other
Medicine Consult Note Hospital Day: 9SubjectivePatient seen at bedside this AM in NAD. Feeling well overall and looking forward to surgery tomorrow. He reports mild bilateral shoulder and left elbow pain exacerbated by using his walker. He believes the joint pain is secondary to crestor as he had similar joint pain while taking lipitor. He denies chest pain, SOB, nausea, vomiting, diarrhea, and constipation.  ObjectiveVitals:  10/24/21 0757 BP: 122/72 Pulse: 63 Resp: 18 Temp: 98.1 ?F (36.7 ?C)  Exam: Gen: Well appearing male in NAD.CV: RRR, no murmur or gallopPulm: No increased WOB, CTABLE: No pitting edema to BLE. Skin: Mild non-tender scaling on the dorsal hand bilaterally with continued improvement in erythema. Labs notable for:Cr 0.9 todayMg 1.8 this morning Vit B12 217Iron studies:Iron 30, TIBC 199, iron sat 15%, ferritin 126 Micro:4/24 deep wound +MRSA and pansensitive pseudomonasPath:4/25Left foot with residual OM at 1st and 2nd metatarsal clean marginsAssessment & Plan44 y.o. year old M with history significant for controlled DM, CAD s/p remote stents and CABG 2022, with prior toe amputations iso neuropathy and likely vascular disease who is s/p L foot debridement of abscess, I&D, and ultimately TMA on 4/25 with plan for closure on 5/2. Medicine is consulted for co-management. # foot wounds, s/p L TMA- Abx per podiatry	- Vanc/Zosyn 4/24 - present- Pain control per podiatry- Pt to OR tomorrow with podiatry for left foot delayed primary closure with possible graft and wound vac application.-Hold sliding scale insulin at midnight while patient is NPO-Resume ssi post-operatively when patient is eating. - Lovenox 40 QD for vte ppx (podiatry holding for procedure)#microcytic anemiaFound to have B12 deficiency- cont B12 PO supplementation 1000mg  daily#T2DM - A1c 6.8 06/2021-BG well controlled on current ssi-Hold ssi MN tonight when NPO as above#CADS/p CABG Jan 2022. Reports recovering well- Continue ASA 81 mg QD for now (home 325) - cont. metoprolol tartrate 25mg  BID-Will adjust rosuvastatin to 10mg  q48h to target LDL < 70 for secondary prevention given new onset arthralgia#Sunburn - photosensitivity iso doxycycline- Lubriderm ordered for bilateral hands#Depression- Lexapro 10mg QDElectronically Signed by Cleaster Corin, DPM, May 4, 2023Plans subject to change with attending's addendum

## 2021-10-25 NOTE — Other
Operative Diagnosis:Pre-op:   Chronic osteomyelitis of hindfoot, left (HC Code) (HC CODE) (HC Code) [Z61.096] Patient Coded Diagnosis   Pre-op diagnosis: Chronic osteomyelitis of hindfoot, left (HC Code) (HC CODE) (HC Code)  Post-op diagnosis: Chronic osteomyelitis of hindfoot, left (HC Code) (HC CODE) (HC Code)  Patient Diagnosis   Pre-op diagnosis: Chronic osteomyelitis of hindfoot, left (HC Code) (HC CODE) (HC Code) [E45.409]  Post-op diagnosis:     Post-op diagnosis:   * Chronic osteomyelitis of hindfoot, left (HC Code) (HC CODE) (HC Code) [W11.914][id number] Procedure(s) :Procedure(s) (LRB):LEFT FOOT METATARSAL RESECTIONS 1, 2, 3, 4, 5 WITH COMPLED DELAYED PRIMARY CLOSURE AND TENDO ACHILLES LENGTHENING. (Left)METATARSAL RESECTIONS 3 AND 4 (Left)SECONDARY CLOSURE, SURGICAL WOUND/DEHISCENCE, EXTENSIVE/COMPLICATED (Left)LENGTHENING/SHORTENING, TENDON, LEG/ANKLE; SINGLE TENDON (SEP PROC) (Left)Post-op Procedure & Diagnosis ConfirmationPost-op Diagnosis: Post-op Diagnosis confirmed (no changes)Post-op Procedure: Post-op Procedure confirmed (no changes)

## 2021-10-25 NOTE — Other
-  CONSULT  REQUEST  [facility] NOTE-Type of consult: [facility] Infectious Diseases -New Consult: [id number] Edwin [name] /Location: L273/L273-02 / Brief Clinical Question: 71yo male with p/w left foot DFU (+) OM, had TMA 4/25, CM were growing, now s/p revisional TMA with m/p sent, consult for Abx guidance and duration/Callback Cell Phone: [phone] / Please confirm receipt of this message by texting back ?OK?-1 - Mobile Heartbeat message sent to [place], s at 12:03 PM. Received response at 1203.-Rihan Schueler Dawson5/5/202312:01 [facility] [phone]

## 2021-10-25 NOTE — Other
Subsequent to admission for surgery or invasive procedure, I have reassessed the patient by examination and review of relevant data pertaining to the planned procedure. I have verified the planned procedure and there are no relevant changes since the H&P.  Cardiac: RRR, s1/s2Lungs: CTAB, no resp distressDenies f/n/v/sob/cpPatient to OR today for left foot metatarsal partial resections 1-5, tendo-Achilles lengthening, delayed primary closure versus graft and vac applicationConsent obtained; all details, potential risks and complications discussedNPO status confirmedSite markedElectronically Signed by Heidi Dach, DPM, Oct 25, 2021

## 2021-10-26 LAB — CBC WITH AUTO DIFFERENTIAL
BKR WAM ABSOLUTE IMMATURE GRANULOCYTES.: 0.05 x 1000/ÂµL (ref 0.00–0.30)
BKR WAM ABSOLUTE LYMPHOCYTE COUNT.: 1.7 x 1000/ÂµL (ref 0.60–3.70)
BKR WAM ABSOLUTE NRBC (2 DEC): 0 x 1000/ÂµL (ref 0.00–1.00)
BKR WAM ANALYZER ANC: 6.63 x 1000/ÂµL (ref 2.00–7.60)
BKR WAM BASOPHIL ABSOLUTE COUNT.: 0.05 x 1000/ÂµL (ref 0.00–1.00)
BKR WAM BASOPHILS: 0.5 % (ref 0.0–1.4)
BKR WAM EOSINOPHIL ABSOLUTE COUNT.: 0.24 x 1000/ÂµL (ref 0.00–1.00)
BKR WAM EOSINOPHILS: 2.5 % (ref 0.0–5.0)
BKR WAM HEMATOCRIT (2 DEC): 31.5 % — ABNORMAL LOW (ref 38.50–50.00)
BKR WAM HEMOGLOBIN: 9.8 g/dL — ABNORMAL LOW (ref 13.2–17.1)
BKR WAM IMMATURE GRANULOCYTES: 0.5 % (ref 0.0–1.0)
BKR WAM LYMPHOCYTES: 17.9 % (ref 17.0–50.0)
BKR WAM MCH (PG): 24.9 pg — ABNORMAL LOW (ref 27.0–33.0)
BKR WAM MCHC: 31.1 g/dL (ref 31.0–36.0)
BKR WAM MCV: 80.2 fL (ref 80.0–100.0)
BKR WAM MONOCYTE ABSOLUTE COUNT.: 0.82 x 1000/ÂµL (ref 0.00–1.00)
BKR WAM MONOCYTES: 8.6 % (ref 4.0–12.0)
BKR WAM MPV: 8.3 fL (ref 8.0–12.0)
BKR WAM NEUTROPHILS: 70 % (ref 39.0–72.0)
BKR WAM NUCLEATED RED BLOOD CELLS: 0 % (ref 0.0–1.0)
BKR WAM PLATELETS: 294 x1000/ÂµL (ref 150–420)
BKR WAM RDW-CV: 14.6 % (ref 11.0–15.0)
BKR WAM RED BLOOD CELL COUNT.: 3.93 M/ÂµL — ABNORMAL LOW (ref 4.00–6.00)
BKR WAM WHITE BLOOD CELL COUNT: 9.5 x1000/ÂµL (ref 4.0–11.0)

## 2021-10-26 LAB — BASIC METABOLIC PANEL
BKR ANION GAP: 9 (ref 7–17)
BKR BLOOD UREA NITROGEN: 14 mg/dL (ref 8–23)
BKR BUN / CREAT RATIO: 14 (ref 8.0–23.0)
BKR CALCIUM: 9.3 mg/dL (ref 8.8–10.2)
BKR CHLORIDE: 109 mmol/L — ABNORMAL HIGH (ref 98–107)
BKR CO2: 22 mmol/L (ref 20–30)
BKR CREATININE: 1 mg/dL (ref 0.40–1.30)
BKR EGFR, CREATININE (CKD-EPI 2021): >60 mL/min/{1.73_m2} (ref >=60)
BKR GLUCOSE: 131 mg/dL — ABNORMAL HIGH (ref 70–100)
BKR POTASSIUM: 4.4 mmol/L (ref 3.3–5.3)
BKR SODIUM: 140 mmol/L (ref 136–144)

## 2021-10-26 LAB — VANCOMYCIN, TROUGH: BKR VANCOMYCIN TROUGH: 17.6 ug/mL — ABNORMAL HIGH (ref 10.0–15.0)

## 2021-10-26 LAB — HEMOGLOBIN AND HEMATOCRIT, BLOOD
BKR WAM HEMATOCRIT (2 DEC): 33 % — ABNORMAL LOW (ref 38.50–50.00)
BKR WAM HEMOGLOBIN: 10.2 g/dL — ABNORMAL LOW (ref 13.2–17.1)

## 2021-10-26 LAB — MAGNESIUM: BKR MAGNESIUM: 1.8 mg/dL (ref 1.7–2.4)

## 2021-10-26 MED ORDER — HYDROMORPHONE 2 MG/ML INJECTION SOLUTION
2 mg/mL | Freq: Once | INTRAVENOUS | Status: CP | PRN
Start: 2021-10-26 — End: ?
  Administered 2021-10-27: 04:00:00 2 mL via INTRAVENOUS

## 2021-10-26 MED ORDER — VANCOMYCIN MAR LEVEL
Freq: Once | INTRAVENOUS | Status: CP
Start: 2021-10-26 — End: ?

## 2021-10-26 NOTE — Progress Notes
VANCOMYCIN LEVEL EVALUATIONCurrent Vancomycin Order: 1 g IV every 12 hours. Day of Therapy: 13Vancomycin Indication: MRSA in DWCx in addition to S viridans and PsAEstimated Creatinine Clearance: 93 mL/min (by C-G formula based on SCr of 0.8 mg/dL).Creatinine (mg/dL) Date Value 16/03/9603 0.80 10/24/2021 0.90 10/23/2021 0.80  Renal Function: StableLevel: Lab Results Last 72 Hours Component Value Date/Time  Vancomycin Trough 17.6 (H) 10/25/2021 10:09 PM  Vancomycin Trough 18.3 (H) 10/24/2021 07:11 AM Type of Level: Trough; Level drawn appropriately? YesBased on vancomycin level obtained, recommend:  Continue current regimenRepeat Level: VT on 10/27/21 @ ~ 1030ID/AST Consulted? No [facility] Vancomycin Dosing GuidelineBH Vancomycin Dosing GuidelineFor questions, please contact the pharmacist: Rolley Sims, PharmD    Phone/Mobile Heartbeat: MHB

## 2021-10-26 NOTE — Plan of Care
Problem: Adult Inpatient Plan of CareGoal: Plan of Care ReviewOutcome: Interventions implemented as appropriate Plan of Care Overview/ Patient Status    Patient is AxOx4, VSS, afebrile, RA. HOH. Contact precautions maintained. Meds and nursing care explained, patient verbalized understanding. IV abx give per [facility]. No complaints of pain at this time. Patient SBA with RW, NWB to LLE. Continent of both, OOB to toilet. Urinal at bedside. Last BM 5/5. Independent with TxP. Dressings to LLE CDI, changes by podiatry. RLE wound with zeroform and kerlix per pt's preference. Skin tear to R forearm OTA. Sunburn to L hand, OTA, eucerin cream at bedside. Safety measures maintained. Call bell within reach. Bed alarm on. See flowsheets for complete assessment. Continue to monitor. 2300 - pt endorsed 7/10 pain. Provider Oneida Alar notified, PRN oxy 10 mg ordered and given.Jeannie Fend, RN

## 2021-10-26 NOTE — Plan of Care
Plan of Care Overview/ Patient Status    Problem: Adult Inpatient Plan of CareGoal: Plan of Care ReviewOutcome: Interventions implemented as appropriate Patient alert/oriented. Continues on IV antibiotic therapy for (L) foot osteomyelitis. (L) foot dressing in place/NWB status continues to (L)LE. OOB to recliner chair/lower extremities elevated while in chair. (L) forearm IV line in place/patent/flush tolerated. C/o 6/10 pain with prn oxycodone requested and administered X 1 with effect. Contact precautions in place. Safety measures maintained.

## 2021-10-26 NOTE — Other
Medicine Consult Note Hospital Day: 10SubjectivePatient seen at bedside this AM in NAD in PACU. Reports he is feeling well following the procedure. Informed patient that the podiatry team was able to close his foot wound. Reports continued bilateral shoulder pain exacerbated by his use of a walker. He denies chest pain, SOB, nausea, vomiting, diarrhea, and constipation.  ObjectiveVitals:  10/25/21 1221 BP: 109/62 Pulse: 70 Resp: 20 Temp: 97.7 ?F (36.5 ?C) Exam: Gen: NAD, lying comfortably in bedCV: RRR, no murmur or gallopPulm: No increased WOB, CTABLE: No pitting edema to BLE. Left foot surgical dressing without strike-through. Skin: Mild non-tender scaling on the dorsal hand bilaterally with continued improvement in erythema. Labs notable for:Cr 0.9 todayMg 1.8 this morning Vit B12 217Iron studies:Iron 30, TIBC 199, iron sat 15%, ferritin 126 Micro:10/14/2021 Deep wound +MRSA and pansensitive pseudomonas5/10/2021 Intra-op cultures pendingPath:4/25/2023Left foot with residual OM at 1st and 2nd metatarsal clean margins5/5/2023Intra-op pathology specimens pending Assessment & Plan58 y.o. year old M with history significant for controlled DM, CAD s/p remote stents and CABG 2022, with prior toe amputations iso neuropathy and likely vascular disease who is s/p L foot debridement of abscess, I&D, and ultimately TMA on 4/25 with plan for closure on 5/2. Medicine is consulted for co-management. # foot wounds, s/p L TMA (10/15/2021), s/p metatarsal resection (10/25/2021)- Abx per podiatry	- Vanc/Zosyn 4/24 - present- Pain control per podiatry- Pt s/p L metatarsal resection with new micro/path specimens obtained intra-op. Infectious disease consulted post-operatively for antibiotic recommendations. - Lovenox 40 QD for vte ppx - held until podiatry deems surgical site hemostatic#microcytic anemiaFound to have B12 deficiency- cont B12 PO supplementation 1000mg  daily#T2DM - A1c 6.8 06/2021-BG well controlled on current ssi-Resume ssi post-operatively #CADS/p CABG Jan 2022. Reports recovering well- Continue ASA 81 mg QD for now (home 325) - cont. metoprolol tartrate 25mg  BID- Rosuvastatin to 10mg  q48h to target LDL < 70 for secondary prevention given new onset arthralgia. Pt would like to continue rosuvastatin as his shoulder pain is tolerable. #Sunburn - photosensitivity iso doxycycline- Lubriderm ordered for bilateral hands#Depression- Lexapro 10mg QDElectronically Signed by Cleaster Corin, DPM, May 5, 2023Plans subject to change with attending's addendum.

## 2021-10-26 NOTE — Plan of Care
Plan of Care Overview/ Patient Status    3:30-7:30pAgree w/previous nursing note, no significant changes. IV ABX per MAR. L foot dressing - c/d/i.  Discussed POC and medication w/ Pt whom verbalized understanding.Tristan Schroeder, RN5/11/2021 5:13 PM

## 2021-10-26 NOTE — Other
Inpatient Medicine - Consult NoteHospital day: 12Yesterday: - underwent wound closure in OR Overnight: no acute events, oxy given for painToday: - patient feeling well today, pain well controlled. Reports no significant bleeding through the dressingObjectiveBP 115/69  - Pulse 64  - Temp 97.8 ?F (36.6 ?C) (Oral)  - Resp 18  - Ht 5' 11 (1.803 m)  - Wt 89.1 kg  - SpO2 96%  - BMI 27.41 kg/m?  Exam:General: pleasant man with engaged in collegial conversation with providerCardiac: regular rhythm and rate, normal S1 and S2 with no murmursPulmonary: breathing comfortably on room air, lungs clear to auscultationMsk: left foot in clean and dry dressingNeuro: alert, conversant, responding to questions appropriatelyLabs notable forChloride 109, stableCreatinine 0.8 --> 1Fasting glucose 131Hgb 11.7 --> 12.1 --> 9.8WBC 9.5Plt 294 Micro 5/5 deep wound cultures - no growth to date Imaging 5/5 foot X-ray: The patient is status post interval revision of the transmetatarsal amputation.Soft tissue swelling , skin staples and air are in keeping with postsurgical changes.Redemonstrated is a small metallic density at the plantar aspect of the calcaneus. Assessment & PlanMs. Wymer is a 71 year old man with a history of  Diabetes, CAD s/p CABG in 2022 and remote stenting, who was admitted for left foot osteomyelitis s/p I&D and TMA, now status post delayed primary closure on 5/5. Wound cultures from initial surgery grew staph aureus, pseudomonas, and viridans. He is clinically doing well and is awaiting final antibiotic plan.# left foot infection- antibiotics, pain, and surgical plan per podiatry# type 2 diabetes - A1c 6.8 in 06/2021. Glucose well controlled off basal, with sliding scale use only- continue low dose sliding scale- neuropathic pain - continue gabapentin 300 mg TID# CAD- continue aspirin 81 mg daily- continue metoprolol tartrate 25 mg BID- continue rosuvastatin 10 mg every 48 hours (limited in the setting of statin associated myalgias)# Vitamin B12 deficiency and microcytic anemia- continue cyanocobalamin 1000 mg daily- repeat CBC this afternoon given post-op drop in hemoglobin. Per discussion with podiatry, minimal blood loss to explain hgb dropSigned: Kinnie Scales, MDInternal MedicineMHB5/11/2021

## 2021-10-27 LAB — CBC WITH AUTO DIFFERENTIAL
BKR WAM ABSOLUTE IMMATURE GRANULOCYTES.: 0.02 x 1000/ÂµL (ref 0.00–0.30)
BKR WAM ABSOLUTE LYMPHOCYTE COUNT.: 2.02 x 1000/ÂµL (ref 0.60–3.70)
BKR WAM ABSOLUTE NRBC (2 DEC): 0 x 1000/ÂµL (ref 0.00–1.00)
BKR WAM ANALYZER ANC: 5.2 x 1000/ÂµL (ref 2.00–7.60)
BKR WAM BASOPHIL ABSOLUTE COUNT.: 0.08 x 1000/ÂµL (ref 0.00–1.00)
BKR WAM BASOPHILS: 1 % (ref 0.0–1.4)
BKR WAM EOSINOPHIL ABSOLUTE COUNT.: 0.18 x 1000/ÂµL (ref 0.00–1.00)
BKR WAM EOSINOPHILS: 2.2 % (ref 0.0–5.0)
BKR WAM HEMATOCRIT (2 DEC): 32.1 % — ABNORMAL LOW (ref 38.50–50.00)
BKR WAM HEMOGLOBIN: 9.9 g/dL — ABNORMAL LOW (ref 13.2–17.1)
BKR WAM IMMATURE GRANULOCYTES: 0.2 % (ref 0.0–1.0)
BKR WAM LYMPHOCYTES: 24.8 % (ref 17.0–50.0)
BKR WAM MCH (PG): 25.3 pg — ABNORMAL LOW (ref 27.0–33.0)
BKR WAM MCHC: 30.8 g/dL — ABNORMAL LOW (ref 31.0–36.0)
BKR WAM MCV: 81.9 fL (ref 80.0–100.0)
BKR WAM MONOCYTE ABSOLUTE COUNT.: 0.64 x 1000/ÂµL (ref 0.00–1.00)
BKR WAM MONOCYTES: 7.9 % (ref 4.0–12.0)
BKR WAM MPV: 8.7 fL (ref 8.0–12.0)
BKR WAM NEUTROPHILS: 63.9 % (ref 39.0–72.0)
BKR WAM NUCLEATED RED BLOOD CELLS: 0 % (ref 0.0–1.0)
BKR WAM PLATELETS: 321 x1000/ÂµL (ref 150–420)
BKR WAM RDW-CV: 14.9 % (ref 11.0–15.0)
BKR WAM RED BLOOD CELL COUNT.: 3.92 M/ÂµL — ABNORMAL LOW (ref 4.00–6.00)
BKR WAM WHITE BLOOD CELL COUNT: 8.1 x1000/ÂµL (ref 4.0–11.0)

## 2021-10-27 LAB — BASIC METABOLIC PANEL
BKR ANION GAP: 10 (ref 7–17)
BKR BLOOD UREA NITROGEN: 14 mg/dL (ref 8–23)
BKR BUN / CREAT RATIO: 12.7 (ref 8.0–23.0)
BKR CALCIUM: 9.8 mg/dL (ref 8.8–10.2)
BKR CHLORIDE: 107 mmol/L (ref 98–107)
BKR CO2: 22 mmol/L (ref 20–30)
BKR CREATININE: 1.1 mg/dL (ref 0.40–1.30)
BKR EGFR, CREATININE (CKD-EPI 2021): >60 mL/min/{1.73_m2} (ref >=60)
BKR GLUCOSE: 114 mg/dL — ABNORMAL HIGH (ref 70–100)
BKR POTASSIUM: 4.4 mmol/L (ref 3.3–5.3)
BKR SODIUM: 139 mmol/L (ref 136–144)

## 2021-10-27 LAB — VANCOMYCIN, TROUGH: BKR VANCOMYCIN TROUGH: 17.5 ug/mL — ABNORMAL HIGH (ref 10.0–15.0)

## 2021-10-27 NOTE — Other
Inpatient Medicine - Consult NoteHospital day: 13Yesterday: - continued on vanc/zosyn, awaiting culture dataOvernight: MAP of 71 overnight (usually in 80s), no interventionsToday: - patient reports that he was not lightheaded or dizzy during periods of hypotension last night- reports PO intake has been normalObjectiveBP (!) 97/57  - Pulse 70  - Temp 98.3 ?F (36.8 ?C) (Oral)  - Resp 18  - Ht 5' 11 (1.803 m)  - Wt 89.1 kg  - SpO2 96%  - BMI 27.41 kg/m?  Exam:General: man lying in bed listening to television with head phones on, eyes closed, no acute distressCardiac: regular rhythm and rate, normal S1 and S2 with no murmursPulmonary: breathing comfortably on room air, lungs clear to auscultationMsk: left foot in clean and dry dressingNeuro: alert, conversant, responding to questions appropriatelyLabs notable forCreatinine 0.8 --> 1 --> 1.1Fasting glucose 114Hgb 11.7 --> 12.1 --> 9.8 --> 10.2 --> 9.9WBC 9.5 --> 8.1Plt 294 --> 5/5 deep wound cultures - no growth to date Imaging 5/5 foot X-ray: The patient is status post interval revision of the transmetatarsal amputation.Soft tissue swelling , skin staples and air are in keeping with postsurgical changes.Redemonstrated is a small metallic density at the plantar aspect of the calcaneus. Assessment & PlanMs. Sauls is a 71 year old man with a history of  Diabetes, CAD s/p CABG in 2022 and remote stenting, who was admitted for left foot osteomyelitis s/p I&D and TMA, now status post delayed primary closure on 5/5. Wound cultures from initial surgery grew staph aureus, pseudomonas, and viridans. He is clinically doing well and is awaiting final antibiotic plan.Mr. Szabo has had intermittent soft blood pressures during which he is asymptomatic. His creatinine has been uptrending over the last two days, but he is tolerating PO intake well.# left foot infection- antibiotics, pain, and surgical plan per podiatry# type 2 diabetes - A1c 6.8 in 06/2021. Glucose well controlled off basal, with sliding scale use only- continue low dose sliding scale- neuropathic pain - continue gabapentin 300 mg TID# Creatinine elevation - mild increase in creatinine from 0.8 starting on 5/6. Normal PO intake, and urine output has not been documented. Differential includes pre-renal AKI from hypovolemia, pre-renal AKI from hypotension, variation of creatinine within baseline range.- trend creatinine, if up-trending tomorrow, obtain urine lytes, bladder scan and consider IV fluid bolus if evidence of hypovolemia# Soft blood pressures - asymptomatic hypotension with spontaneous resolution. Periods of hypotension are not always accompanied by bradycardia, so unclear if dose reduction of metoprolol would be helpful. No evidence of hypotension secondary to infection- continue metoprolol, consider dose reduction to 12.5 mg BID if persistent hypotension and bradycardia# CAD- continue aspirin 81 mg daily- continue metoprolol tartrate 25 mg BID- continue rosuvastatin 10 mg every 48 hours (limited in the setting of statin associated myalgias)# Vitamin B12 deficiency and microcytic anemia- continue cyanocobalamin 1000 mg daily- repeat CBC this afternoon given post-op drop in hemoglobin. Per discussion with podiatry, minimal blood loss to explain hgb dropSigned: Kinnie Scales, MDInternal MedicineMHB5/11/2021

## 2021-10-27 NOTE — Plan of Care
Problem: Adult Inpatient Plan of CareGoal: Plan of Care ReviewOutcome: Interventions implemented as appropriate Plan of Care Overview/ Patient Status    Patient alert/oriented. Lung sounds diminished/remains on room air. (L) FA with IV line in place/patent/continues on IV antibiotics/(L) osteomyelitis with no adverse reaction noted. Abdomen soft non tender with active BS X all 4 quads. (L)foot with dressing in place/NWB status remains. OOB to bathroom with RW and assist X 1 then back to recliner chair/BLE elevated/denies pain to (L) lower extremity. Contact precautions in place. All safety measures maintained.

## 2021-10-27 NOTE — Plan of Care
Problem: Adult Inpatient Plan of CareGoal: Plan of Care ReviewOutcome: Interventions implemented as appropriate Plan of Care Overview/ Patient Status    Patient is AxOx4, VSS, afebrile, RA. HOH. Contact precautions maintained. Meds and nursing care explained, patient verbalized understanding. IV abx give per [facility]. pt endorses pain to LLE, PRN tylenol, oxy and one time dose of dilaudid for breakthrough pain given with good effect  Patient SBA with RW, NWB to LLE. Continent of both, OOB to toilet. Urinal at bedside. Last BM 5/6. Independent with TxP. Dressings to LLE CDI, changes by podiatry. RLE wound with zeroform and kerlix per pt's preference. Skin tear to R forearm OTA. Sunburn to L hand, OTA, eucerin cream at bedside. Safety measures maintained. Call bell within reach. Bed alarm on. See flowsheets for complete assessment. Continue to monitor. 0030 - bp 92/6, provider notified, no new orders. Jeannie Fend, RN

## 2021-10-27 NOTE — Consults
VANCOMYCIN LEVEL EVALUATIONCurrent Vancomycin Order: 1 g IV every 12 hours. Day of Therapy: 15Vancomycin Indication: MRSA in DWCx in addition to S viridans and PsAEstimated Creatinine Clearance: 68 mL/min (by C-G formula based on SCr of 1.1 mg/dL).Creatinine (mg/dL) Date Value 16/03/9603 1.10 10/26/2021 1.00 10/25/2021 0.80  Renal Function: StableLevel: Lab Results Last 72 Hours Component Value Date/Time  Vancomycin Trough 17.5 (H) 10/27/2021 08:18 AM  Vancomycin Trough 17.6 (H) 10/25/2021 10:09 PM Type of Level: Trough; Level drawn appropriately? No, drawn ~ 2 hours early, expected to be therapeutic.Based on vancomycin level obtained, recommend:  Continue current regimenRepeat Level: WeeklyID/AST Consulted? Yes [facility] Vancomycin Dosing GuidelineBH Vancomycin Dosing GuidelineFor questions, please contact the pharmacist:Zyler Hyson, PharmDClinical PharmacistPhone: MHB5/12/2021 @ 11:03

## 2021-10-28 LAB — CBC WITH AUTO DIFFERENTIAL
BKR WAM ABSOLUTE IMMATURE GRANULOCYTES.: 0.04 x 1000/ÂµL (ref 0.00–0.30)
BKR WAM ABSOLUTE LYMPHOCYTE COUNT.: 1.93 x 1000/ÂµL (ref 0.60–3.70)
BKR WAM ABSOLUTE NRBC (2 DEC): 0 x 1000/ÂµL (ref 0.00–1.00)
BKR WAM ANALYZER ANC: 5.48 x 1000/ÂµL (ref 2.00–7.60)
BKR WAM BASOPHIL ABSOLUTE COUNT.: 0.08 x 1000/ÂµL (ref 0.00–1.00)
BKR WAM BASOPHILS: 0.9 % (ref 0.0–1.4)
BKR WAM EOSINOPHIL ABSOLUTE COUNT.: 0.19 x 1000/ÂµL (ref 0.00–1.00)
BKR WAM EOSINOPHILS: 2.2 % (ref 0.0–5.0)
BKR WAM HEMATOCRIT (2 DEC): 28.1 % — ABNORMAL LOW (ref 38.50–50.00)
BKR WAM HEMOGLOBIN: 9 g/dL — ABNORMAL LOW (ref 13.2–17.1)
BKR WAM IMMATURE GRANULOCYTES: 0.5 % (ref 0.0–1.0)
BKR WAM LYMPHOCYTES: 22.6 % (ref 17.0–50.0)
BKR WAM MCH (PG): 25.1 pg — ABNORMAL LOW (ref 27.0–33.0)
BKR WAM MCHC: 32 g/dL (ref 31.0–36.0)
BKR WAM MCV: 78.5 fL — ABNORMAL LOW (ref 80.0–100.0)
BKR WAM MONOCYTE ABSOLUTE COUNT.: 0.81 x 1000/ÂµL (ref 0.00–1.00)
BKR WAM MONOCYTES: 9.5 % (ref 4.0–12.0)
BKR WAM MPV: 8.7 fL (ref 8.0–12.0)
BKR WAM NEUTROPHILS: 64.3 % (ref 39.0–72.0)
BKR WAM NUCLEATED RED BLOOD CELLS: 0 % (ref 0.0–1.0)
BKR WAM PLATELETS: 276 x1000/ÂµL (ref 150–420)
BKR WAM RDW-CV: 14.8 % (ref 11.0–15.0)
BKR WAM RED BLOOD CELL COUNT.: 3.58 M/ÂµL — ABNORMAL LOW (ref 4.00–6.00)
BKR WAM WHITE BLOOD CELL COUNT: 8.5 x1000/ÂµL (ref 4.0–11.0)

## 2021-10-28 LAB — MAGNESIUM: BKR MAGNESIUM: 1.5 mg/dL — ABNORMAL LOW (ref 1.7–2.4)

## 2021-10-28 LAB — RESPIRATORY VIRUS PCR PANEL  (YH VERIGENE)(LAB ORDER ONLY)
BKR ADENOVIRUS: NEGATIVE
BKR HUMAN METAPNEUMOVIRUS (HMPV): NEGATIVE
BKR INFLUENZA A: NEGATIVE
BKR INFLUENZA B: NEGATIVE
BKR PARAINFLUENZA VIRUS 1: NEGATIVE
BKR PARAINFLUENZA VIRUS 2: NEGATIVE
BKR PARAINFLUENZA VIRUS 3: NEGATIVE
BKR PARAINFLUENZA VIRUS 4: NEGATIVE
BKR RESPIRATORY SYNCYTIAL VIRUS: NEGATIVE
BKR RHINOVIRUS: NEGATIVE

## 2021-10-28 LAB — SARS COV-2 (COVID-19) RNA: BKR SARS-COV-2 RNA (COVID-19) (YH): NEGATIVE

## 2021-10-28 LAB — BASIC METABOLIC PANEL
BKR ANION GAP: 11 (ref 7–17)
BKR BLOOD UREA NITROGEN: 15 mg/dL (ref 8–23)
BKR BUN / CREAT RATIO: 15 (ref 8.0–23.0)
BKR CALCIUM: 9.7 mg/dL (ref 8.8–10.2)
BKR CHLORIDE: 107 mmol/L (ref 98–107)
BKR CO2: 21 mmol/L (ref 20–30)
BKR CREATININE: 1 mg/dL (ref 0.40–1.30)
BKR EGFR, CREATININE (CKD-EPI 2021): >60 mL/min/{1.73_m2} (ref >=60)
BKR GLUCOSE: 131 mg/dL — ABNORMAL HIGH (ref 70–100)
BKR POTASSIUM: 4 mmol/L (ref 3.3–5.3)
BKR SODIUM: 139 mmol/L (ref 136–144)

## 2021-10-28 MED ORDER — VANCOMYCIN MAR LEVEL
Freq: Once | INTRAVENOUS | Status: CP
Start: 2021-10-28 — End: ?

## 2021-10-28 MED ORDER — MAGNESIUM SULFATE 4 GRAM/100 ML (4 %) IN WATER INTRAVENOUS PIGGYBACK
4 gram/100 mL ( %) | Freq: Once | INTRAVENOUS | Status: CP
Start: 2021-10-28 — End: ?
  Administered 2021-10-28: 16:00:00 4 mL/h via INTRAVENOUS

## 2021-10-28 NOTE — Progress Notes
[facility]	Podiatry Progress Note5/6/2023Subjective: Patient seen at bedside this AM, NAD. NAEO.  Patient reports some shooting pain from anterior ankle distally last night.  Patient states that pain was well controlled with medicine.  Discussed plans with patient moving forward to include awaiting intraop micro/path then finalized ID recs.  Patient verbalizes understanding and is in agreement.  Denies any pain or n/f/c/vomiting/sob/cp. S/p left foot metatarsal resections 1-5 with delayed primary closure, CLOSED (DOS: 10/25/2021).S/p left foot transmetatarsal amputation, partially OPEN (DOS: 10/15/2021).Objective: VitalsTemp:  [97.7 ?F (36.5 ?C)-98.4 ?F (36.9 ?C)] 98.2 ?F (36.8 ?C)Pulse:  [64-75] 68Resp:  [18-20] 20BP: (109-128)/(62-71) 116/64SpO2:  [96 %-100 %] 98 %Gen: Alert, no distressLeft foot:  Left foot warm and well-perfused.  TMA surgical incision well coapt with sutures intact.  No drainage noted from incision.  Mild erythema and edema around incision consistent with postop course.  No fluctuance, crepitus, proximal streaking, purulent drainage.Achilles surgical incisions well coapt with sutures intact.  No drainage noted from incision.  Mild erythema and edema around incision consistent with postop course.  No fluctuance, crepitus, proximal streaking, purulent drainage.Labs: Recent Labs   05/04/230711 05/04/230816 05/05/230612 05/05/230658 05/06/230509 05/06/230825 WBC 7.6  --  7.2  --  9.5  --  HGB 11.7*  --  12.1*  --  9.8*  --  HCT 36.90*  --  37.40*  --  31.50*  --  CREATININE 0.90  --  0.80  --  1.00  --  BUN 16  --  14  --  14  --  CO2 20  --  21  --  22  --  PLT 353  --  333  --  294  --  GLU 132*   < > 140*   < > 131* 118* K 3.9  --  4.3  --  4.4  --  NA 138  --  140  --  140  --  CL 107  --  109*  --  109*  --   < > = values in this interval not displayed. Imaging: XR Left Foot: 5/5/23FINDINGS:The patient is status post interval revision of the transmetatarsal amputation. Soft tissue swelling , skin staples and air are in keeping with postsurgical changes.Redemonstrated is a small metallic density at the plantar aspect of the calcaneus. IMPRESSION: Postsurgical changes, as above.XR Left Foot: 10/15/2021 FINDINGS:Patient is status post interval transmetatarsal amputation at the level of the mid shaft.Mild degenerative changes are noted in the included midfoot with small ossicles at the dorsal aspect of the talonavicular joint, in keeping with a sequel of prior injury.Soft tissue swelling and air is in keeping with postsurgical changes.IMPRESSION: Post surgical changes, as above.Micro/Path: Micro: 10/25/2021 - prelim1. L foot 1st MT CM: NGTD2. L foot 2nd MT CM: NGTD3. L foot 4th MT CM: NGTD4. L foot 5th MT CM: NGTD Path: 10/25/2021 - pending1. L foot 1st MT CM2. L foot 2nd MT CM3. L foot 4th MT CM4. L foot 5th MT CM5. L foot MT resectionMicro: 10/15/2021 -  final1. Left 2nd toe wound -  strep viridans2. Left 3rd toe bone -  strep viridans3. Left foot tissue - Pseudomonas aeruginosa, mixed GP/GN organism4. Left 1st metatarsal clean margin -  NGTD5. Left 2nd metatarsal clean margin - NGTD6. Left 3rd metatarsal clean margin - NGTD7. Left 4th metatarsal clean margin- SA8. Left 5th metatarsal clean margin- SA Path: 10/15/2021 - FINAL DIAGNOSIS 1.  LEFT 2ND TOE BONE, BIOPSY:        - ACUTE OSTEOMYELITIS 2.  LEFT 3RD TOE BONE, BIOPSY:             - ARTICULAR CARTILAGE AND VIABLE BONE WITH ACUTE OSTEOMYELITIS AND FEATURES OF OSTEOARTHRITIS      - VIABLE SOFT TISSUE WITH ACUTE AND CHRONIC INFLAMMATION 3.  LEFT FOOT TISSUE, DEBRIDEMENT:             - BONE WITH ACUTE OSTEOMYELITIS AND OVERLYING ACUTELY INFLAMED SOFT TISSUE 4.  LEFT 1ST METATARSAL, CLEAN MARGIN:             - VIABLE BONE WITH FATTY MARROW AND AGGREGATES OF ACUTE INFLAMMATORY CELLS CONSISTENT WITH FOCAL ACUTE OSTEOMYELITIS 5.  LEFT 2ND METATARSAL, CLEAN MARGIN:             - VIABLE AND FOCALLY DEVITALIZED BONE WITH FATTY MARROW AND ACUTE INFLAMMATORY CELLS CONSISTENT WITH FOCAL ACUTE OSTEOMYELITIS 6.  LEFT 3RD METATARSAL CLEAN MARGIN:             - VIABLE BONE WITH FATTY MARROW AND SCATTERED ACUTE INFLAMMATORY CELLS BUT NO EVIDENCE OF OSTEOMYELITIS      - VIABLE SOFT TISSUE WITHOUT ACUTE INFLAMMATION 7.  LEFT 4TH METATARSAL, CLEAN MARGIN:             -  VIABLE BONE WITH FATTY MARROW AND NO EVIDENCE OF OSTEOMYELITIS      - VIABLE SOFT TISSUE WITHOUT ACUTE INFLAMMATION 8.  LEFT 5TH METATARSAL, CLEAN MARGIN:             - VIABLE SOFT TISSUE WITH PERIOSTEAL REACTIVE BONE FORMATION BUT NO MATURE BONE OR INFLAMMATION  9.  LEFT FOOT, TRANSMETATARSAL AMPUTATION:             - THIRD, FOURTH, AND FIFTH TOES WITH ARTICULAR CARTILAGE AND VIABLE BONE WITH FATTY MARROW, RARE ACUTE INFLAMMATORY CELL, BUT NO EVIDENCE OF OSTEOMYELITIS, AND VIABLE SOFT TISSUE WITH VERY FOCAL ACUTE INFLAMMATION       - THREE OF FOUR SEPARATE SPECIMENS EXHIBITING ACUTE OSTEOMYELITIS      - DORSAL AND PLANTER SURGICAL MARGINS EXHIBITING ERODED AND ULCERATED SKIN AND UNDERLYING ACUTE INFLAMMATION Assessment/Plan: 71 y.o. male with PMHx as noted s/p left foot transmetatarsal amputation, partially OPEN (DOS: 10/15/2021), and now s/p  left foot metatarsal resections 1-5 with delayed primary closure, CLOSED (DOS: 10/25/2021).  Patient doing well postoperatively- Dressings changed: betadine DSD- Podiatric surgery will perform dressing changes as indicated- Nursing please reinforce dressing PRN with ABDs, 4x4 gauze, kerlix, ACE Abx- vanc 10/14/2021-present- zosyn 10/14/2021-present Consult- Medicine: for medical comanagement and pre-op risk stratification- VS: for perfusion assessment; perfusion deemed to be adequate vascular signed off- Infectious Disease: for antibiotic guidance and duration Diet: CCActivity: NWB to LLE, WBAT to RLEDVT Ppx: lovenoxCode: Full Dispo:   pending finalized ID recs, no further podiatric surgical intervention planned during this admission.Attending, Dr. Lesle Reek page on-call podiatry resident with questions:SEE Army Melia FOR CALL SCHEDULEElectronically Signed by Heidi Dach, DPM, May 6, 2023Discussed with resident, agree with plan. Electronically Signed by Paulene Floor, DPM, Oct 26, 2021

## 2021-10-28 NOTE — Plan of Care
Plan of Care Overview/ Patient Status    Patient is alert and oriented x4, afebrile,vss room air, lungs clear dim both bases. Meds and nursing procedures explained and verbalizing understanding and receptive, denies pain. Left foot dressing remains dry and intact, dressing as per Podiatry elevated on pillows. Dressing to right foot dry and intact. Mag replaced, 2 new saline locks, iv vanco and zosyn as scheduled, pls see MAR. See doc flow sheet for complete assessment, safety and contact prec maintained. Cont to monitor. NWB to his left leg. EDin RN

## 2021-10-28 NOTE — Plan of Care
Problem: Adult Inpatient Plan of CareGoal: Plan of Care ReviewOutcome: Interventions implemented as appropriate Plan of Care Overview/ Patient Status    Patient is AxOx4, VSS, afebrile, RA. HOH. Contact precautions maintained. Meds and nursing care explained, patient verbalized understanding. IV abx give per [facility]. No pain at this time. Patient SBA with RW, NWB to LLE. Continent of both, OOB to toilet. Urinal at bedside. Last BM 5/7. Independent with TxP. Dressings to LLE CDI, changes by podiatry. RLE wound with zeroform and kerlix per pt's preference. Skin tear to R forearm OTA. Sunburn to L hand, OTA, eucerin cream at bedside. Safety measures maintained. Call bell within reach. Bed alarm on. See flowsheets for complete assessment. Continue to monitor. 2130 - temp 101.5. provider Alverda Skeans notified, orders for covid and resp swab placed. Collected swabs and administered PRN tylenol with good effect. Jeannie Fend, RN

## 2021-10-28 NOTE — Progress Notes
[facility]	Podiatry Progress Note5/8/2023Subjective: Patient seen at bedside this AM, NAD. NAEO.  Patient reports most of his pain is at night.  Patient states that pain was well controlled since yesterday. Discussed plans with patient moving forward to include awaiting intraop micro/path then finalized ID recs.  Patient verbalizes understanding and is in agreement.  Denies any pain or n/f/c/vomiting/sob/cp. S/p left foot metatarsal resections 1-5 with delayed primary closure, CLOSED (DOS: 10/25/2021).S/p left foot transmetatarsal amputation, partially OPEN (DOS: 10/15/2021).Objective: VitalsTemp:  [98.3 ?F (36.8 ?C)-101.5 ?F (38.6 ?C)] 98.9 ?F (37.2 ?C)Pulse:  [65-87] 73Resp:  [20] 20BP: (108-133)/(64-75) 133/75SpO2:  [96 %-99 %] 96 %Gen: Alert, no distressLeft foot:  Left foot warm and well-perfused.  TMA surgical incision well coapt with sutures intact.  No drainage noted from incision.  Mild erythema and edema around incision consistent with postop course.  No fluctuance, crepitus, proximal streaking, purulent drainage.Achilles surgical incisions well coapt with sutures intact.  No drainage noted from incision.  Mild erythema and edema around incision consistent with postop course.  No fluctuance, crepitus, proximal streaking, purulent drainage.Labs: Recent Labs   05/06/230509 05/06/230825 05/07/230818 05/07/230836 05/08/230559 WBC 9.5  --  8.1  --  8.5 HGB 9.8*   < > 9.9*  --  9.0* HCT 31.50*   < > 32.10*  --  28.10* CREATININE 1.00  --  1.10  --  1.00 BUN 14  --  14  --  15 CO2 22  --  22  --  21 PLT 294  --  321  --  276 GLU 131*   < > 114*   < > 131* K 4.4  --  4.4  --  4.0 NA 140  --  139  --  139 CL 109*  --  107  --  107  < > = values in this interval not displayed. Imaging: XR Left Foot: 5/5/23FINDINGS:The patient is status post interval revision of the transmetatarsal amputation.Soft tissue swelling , skin staples and air are in keeping with postsurgical changes.Redemonstrated is a small metallic density at the plantar aspect of the calcaneus.IMPRESSION: Postsurgical changes, as above.XR Left Foot: 10/15/2021 FINDINGS:Patient is status post interval transmetatarsal amputation at the level of the mid shaft.Mild degenerative changes are noted in the included midfoot with small ossicles at the dorsal aspect of the talonavicular joint, in keeping with a sequel of prior injury.Soft tissue swelling and air is in keeping with postsurgical changes.IMPRESSION: Post surgical changes, as above.Micro/Path: Micro: 10/25/2021 - prelim1. L foot 1st MT CM: NGTD2. L foot 2nd MT CM: NGTD3. L foot 4th MT CM: NGTD4. L foot 5th MT CM: NGTD Path: 10/25/2021 - pending1. L foot 1st MT CM2. L foot 2nd MT CM3. L foot 4th MT CM4. L foot 5th MT CM5. L foot MT resectionMicro: 10/15/2021 -  final1. Left 2nd toe wound -  strep viridans2. Left 3rd toe bone -  strep viridans3. Left foot tissue - Pseudomonas aeruginosa, mixed GP/GN organism4. Left 1st metatarsal clean margin -  NGTD5. Left 2nd metatarsal clean margin - NGTD6. Left 3rd metatarsal clean margin - NGTD7. Left 4th metatarsal clean margin- SA8. Left 5th metatarsal clean margin- SA Path: 10/15/2021 - FINAL DIAGNOSIS 1.  LEFT 2ND TOE BONE, BIOPSY:        - ACUTE OSTEOMYELITIS 2.  LEFT 3RD TOE BONE, BIOPSY:             - ARTICULAR CARTILAGE AND VIABLE BONE WITH ACUTE OSTEOMYELITIS AND FEATURES OF OSTEOARTHRITIS      -  VIABLE SOFT TISSUE WITH ACUTE AND CHRONIC INFLAMMATION 3.  LEFT FOOT TISSUE, DEBRIDEMENT:             - BONE WITH ACUTE OSTEOMYELITIS AND OVERLYING ACUTELY INFLAMED SOFT TISSUE 4.  LEFT 1ST METATARSAL, CLEAN MARGIN:             - VIABLE BONE WITH FATTY MARROW AND AGGREGATES OF ACUTE INFLAMMATORY CELLS CONSISTENT WITH FOCAL ACUTE OSTEOMYELITIS 5. LEFT 2ND METATARSAL, CLEAN MARGIN:             - VIABLE AND FOCALLY DEVITALIZED BONE WITH FATTY MARROW AND ACUTE INFLAMMATORY CELLS CONSISTENT WITH FOCAL ACUTE OSTEOMYELITIS 6.  LEFT 3RD METATARSAL CLEAN MARGIN:             - VIABLE BONE WITH FATTY MARROW AND SCATTERED ACUTE INFLAMMATORY CELLS BUT NO EVIDENCE OF OSTEOMYELITIS      - VIABLE SOFT TISSUE WITHOUT ACUTE INFLAMMATION 7.  LEFT 4TH METATARSAL, CLEAN MARGIN:             -  VIABLE BONE WITH FATTY MARROW AND NO EVIDENCE OF OSTEOMYELITIS      - VIABLE SOFT TISSUE WITHOUT ACUTE INFLAMMATION 8.  LEFT 5TH METATARSAL, CLEAN MARGIN:             - VIABLE SOFT TISSUE WITH PERIOSTEAL REACTIVE BONE FORMATION BUT NO MATURE BONE OR INFLAMMATION  9.  LEFT FOOT, TRANSMETATARSAL AMPUTATION:             - THIRD, FOURTH, AND FIFTH TOES WITH ARTICULAR CARTILAGE AND VIABLE BONE WITH FATTY MARROW, RARE ACUTE INFLAMMATORY CELL, BUT NO EVIDENCE OF OSTEOMYELITIS, AND VIABLE SOFT TISSUE WITH VERY FOCAL ACUTE INFLAMMATION       - THREE OF FOUR SEPARATE SPECIMENS EXHIBITING ACUTE OSTEOMYELITIS      - DORSAL AND PLANTER SURGICAL MARGINS EXHIBITING ERODED AND ULCERATED SKIN AND UNDERLYING ACUTE INFLAMMATION Assessment/Plan: 71 y.o. male with PMHx as noted s/p left foot transmetatarsal amputation, partially OPEN (DOS: 10/15/2021), and now s/p  left foot metatarsal resections 1-5 with delayed primary closure, CLOSED (DOS: 10/25/2021).  Patient doing well postoperatively- Dressings changed: betadine DSD- Podiatric surgery will perform dressing changes as indicated- Nursing please reinforce dressing PRN with ABDs, 4x4 gauze, kerlix, ACE- At this time patient will be discharged pending finalize micro path and ID recommendations. Abx- vanc 10/14/2021-present- zosyn 10/14/2021-present Consult- Medicine: for medical comanagement and pre-op risk stratification- VS: for perfusion assessment; perfusion deemed to be adequate vascular signed off- Infectious Disease: for antibiotic guidance and duration Diet: CCActivity: NWB to LLE, WBAT to RLEDVT Ppx: lovenoxCode: Full Dispo:   pending finalized ID recs, no further podiatric surgical intervention planned during this admission.Will discuss with attending, Dr. Lesle Reek page on-call podiatry resident with questions:SEE Army Melia FOR CALL Angela Burke, DPM PGY-2 [place] [facility]iatric SurgeryElectronically Signed by Donne Hazel, DPM, May 8, 2023Discussed with resident, agree with plan. Electronically Signed by Paulene Floor, DPM, Oct 28, 2021

## 2021-10-28 NOTE — Other
Inpatient Medicine - Consult NoteHospital day: 14Today: Pt seen at bedside this morning in NAD. Pt noted to be febrile to 101.5 last night. Pt states he felt as though he had a fever last night, but woke up feeling better this morning. He reports intermittent mild pain to the LLE. He denies nausea, vomiting, diarrhea, constipation, SOB, and chest pain. ObjectiveBP 136/84  - Pulse 83  - Temp 98.2 ?F (36.8 ?C) (Oral)  - Resp 20  - Ht 5' 11 (1.803 m)  - Wt 89.1 kg  - SpO2 96%  - BMI 27.41 kg/m?  Exam:General: Pleasant man lying comfortably in bedCardiac: Regular rhythm and rate, normal S1 and S2 with no murmursPulmonary: Breathing comfortably on room air, CTABMsk: Minimal pitting edema to the LLE. No edema on the right. No erythema. Left foot surgical dressing intact without strike-through. Neuro: Alert, conversant, responding to questions adequatelyLabs notable forCreatinine 0.8 --> 1 --> 1.1 --> 1Fasting glucose 131Hgb 11.7 --> 12.1 --> 9.8 --> 10.2 --> 9.9 --> 9WBC 9.5 --> 8.1 --> 8.5Plt 294 --> 321 --> 276Mg  1.8 --> 1.5 Micro 5/5 deep wound cultures - NGTDImaging 5/5 foot X-ray: The patient is status post interval revision of the transmetatarsal amputation.Soft tissue swelling , skin staples and air are in keeping with postsurgical changes.Redemonstrated is a small metallic density at the plantar aspect of the calcaneus. Assessment & PlanMs. 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 Wound cultures from initial surgery grew staph aureus, pseudomonas, and viridans. He continues to do well post-operatively and is awaiting finalized antibiotic plans. Mr. Bille has had intermittent soft blood pressures during which he is asymptomatic. His creatinine has been uptrending over the last two days, but he is tolerating PO intake well.# left foot infection- antibiotics, pain, and surgical plan per podiatry- awaiting finalized micro/path and antibiotic recommendations # type 2 diabetes - A1c 6.8 in 06/2021. Glucose well controlled off basal, with sliding scale use only- continue low dose sliding scale- neuropathic pain - continue gabapentin 300 mg TID# Creatinine elevation - mild increase in creatinine from 0.8 starting on 5/6. Normal PO intake, and urine output has not been documented. Differential includes pre-renal AKI from hypovolemia, pre-renal AKI from hypotension, variation of creatinine within baseline range.- Cr improved today, CTM- encourage PO intake# Soft blood pressures - asymptomatic hypotension with spontaneous resolution over the weekend. Periods of hypotension are not always accompanied by bradycardia, so unclear if dose reduction of metoprolol would be helpful. No evidence of hypotension secondary to infection.- BP and HR stable and well controlled at this time. - continue metoprolol- can consider dose reduction to 12.5 mg BID if persistent hypotension and bradycardia# CAD- continue aspirin 81 mg daily- continue metoprolol tartrate 25 mg BID- continue rosuvastatin 10 mg every 48 hours (limited in the setting of statin associated myalgias)# Vitamin B12 deficiency and microcytic anemia- continue cyanocobalamin 1000 mg daily- Hgb 9 & Hct 28.1 today. No bleeding from the left foot surgical site per podiatry. - Patient denying dark stools.- Continue to monitorElectronically Signed by Cleaster Corin, DPM, May 8, 2023Plans subject to change with attending's addendum.

## 2021-10-28 NOTE — Progress Notes
[facility]	Podiatry Progress Note5/7/2023Subjective: Patient seen at bedside this AM, NAD. NAEO.  Patient reports most of his pain is at night.  Patient states that pain was well controlled with after additional medication was given last night.  Discussed plans with patient moving forward to include awaiting intraop micro/path then finalized ID recs.  Patient verbalizes understanding and is in agreement.  Denies any pain or n/f/c/vomiting/sob/cp. S/p left foot metatarsal resections 1-5 with delayed primary closure, CLOSED (DOS: 10/25/2021).S/p left foot transmetatarsal amputation, partially OPEN (DOS: 10/15/2021).Objective: VitalsTemp:  [98.3 ?F (36.8 ?C)-98.9 ?F (37.2 ?C)] 98.4 ?F (36.9 ?C)Pulse:  [57-76] 66Resp:  [18-20] 20BP: (92-122)/(57-74) 108/67SpO2:  [94 %-98 %] 96 %Gen: Alert, no distressLeft foot:  Left foot warm and well-perfused.  TMA surgical incision well coapt with sutures intact.  No drainage noted from incision.  Mild erythema and edema around incision consistent with postop course.  No fluctuance, crepitus, proximal streaking, purulent drainage.Achilles surgical incisions well coapt with sutures intact.  No drainage noted from incision.  Mild erythema and edema around incision consistent with postop course.  No fluctuance, crepitus, proximal streaking, purulent drainage.Labs: Recent Labs   05/05/230612 05/05/230658 05/06/230509 05/06/230825 05/07/230818 05/07/230836 WBC 7.2  --  9.5  --  8.1  --  HGB 12.1*  --  9.8*   < > 9.9*  --  HCT 37.40*  --  31.50*   < > 32.10*  --  CREATININE 0.80  --  1.00  --  1.10  --  BUN 14  --  14  --  14  --  CO2 21  --  22  --  22  --  PLT 333  --  294  --  321  --  GLU 140*   < > 131*   < > 114* 111* K 4.3  --  4.4  --  4.4  --  NA 140  --  140  --  139  --  CL 109*  --  109*  --  107  --   < > = values in this interval not displayed. Imaging: XR Left Foot: 5/5/23FINDINGS:The patient is status post interval revision of the transmetatarsal amputation.Soft tissue swelling , skin staples and air are in keeping with postsurgical changes.Redemonstrated is a small metallic density at the plantar aspect of the calcaneus.IMPRESSION: Postsurgical changes, as above.XR Left Foot: 10/15/2021 FINDINGS:Patient is status post interval transmetatarsal amputation at the level of the mid shaft.Mild degenerative changes are noted in the included midfoot with small ossicles at the dorsal aspect of the talonavicular joint, in keeping with a sequel of prior injury.Soft tissue swelling and air is in keeping with postsurgical changes.IMPRESSION: Post surgical changes, as above.Micro/Path: Micro: 10/25/2021 - prelim1. L foot 1st MT CM: NGTD2. L foot 2nd MT CM: NGTD3. L foot 4th MT CM: NGTD4. L foot 5th MT CM: NGTD Path: 10/25/2021 - pending1. L foot 1st MT CM2. L foot 2nd MT CM3. L foot 4th MT CM4. L foot 5th MT CM5. L foot MT resectionMicro: 10/15/2021 -  final1. Left 2nd toe wound -  strep viridans2. Left 3rd toe bone -  strep viridans3. Left foot tissue - Pseudomonas aeruginosa, mixed GP/GN organism4. Left 1st metatarsal clean margin -  NGTD5. Left 2nd metatarsal clean margin - NGTD6. Left 3rd metatarsal clean margin - NGTD7. Left 4th metatarsal clean margin- SA8. Left 5th metatarsal clean margin- SA Path: 10/15/2021 - FINAL DIAGNOSIS 1.  LEFT 2ND TOE BONE, BIOPSY:        -  ACUTE OSTEOMYELITIS 2.  LEFT 3RD TOE BONE, BIOPSY:             - ARTICULAR CARTILAGE AND VIABLE BONE WITH ACUTE OSTEOMYELITIS AND FEATURES OF OSTEOARTHRITIS      - VIABLE SOFT TISSUE WITH ACUTE AND CHRONIC INFLAMMATION 3.  LEFT FOOT TISSUE, DEBRIDEMENT:             - BONE WITH ACUTE OSTEOMYELITIS AND OVERLYING ACUTELY INFLAMED SOFT TISSUE 4.  LEFT 1ST METATARSAL, CLEAN MARGIN:             - VIABLE BONE WITH FATTY MARROW AND AGGREGATES OF ACUTE INFLAMMATORY CELLS CONSISTENT WITH FOCAL ACUTE OSTEOMYELITIS 5.  LEFT 2ND METATARSAL, CLEAN MARGIN:             - VIABLE AND FOCALLY DEVITALIZED BONE WITH FATTY MARROW AND ACUTE INFLAMMATORY CELLS CONSISTENT WITH FOCAL ACUTE OSTEOMYELITIS 6.  LEFT 3RD METATARSAL CLEAN MARGIN:             - VIABLE BONE WITH FATTY MARROW AND SCATTERED ACUTE INFLAMMATORY CELLS BUT NO EVIDENCE OF OSTEOMYELITIS      - VIABLE SOFT TISSUE WITHOUT ACUTE INFLAMMATION 7.  LEFT 4TH METATARSAL, CLEAN MARGIN:             -  VIABLE BONE WITH FATTY MARROW AND NO EVIDENCE OF OSTEOMYELITIS      - VIABLE SOFT TISSUE WITHOUT ACUTE INFLAMMATION 8.  LEFT 5TH METATARSAL, CLEAN MARGIN:             - VIABLE SOFT TISSUE WITH PERIOSTEAL REACTIVE BONE FORMATION BUT NO MATURE BONE OR INFLAMMATION  9.  LEFT FOOT, TRANSMETATARSAL AMPUTATION:             - THIRD, FOURTH, AND FIFTH TOES WITH ARTICULAR CARTILAGE AND VIABLE BONE WITH FATTY MARROW, RARE ACUTE INFLAMMATORY CELL, BUT NO EVIDENCE OF OSTEOMYELITIS, AND VIABLE SOFT TISSUE WITH VERY FOCAL ACUTE INFLAMMATION       - THREE OF FOUR SEPARATE SPECIMENS EXHIBITING ACUTE OSTEOMYELITIS      - DORSAL AND PLANTER SURGICAL MARGINS EXHIBITING ERODED AND ULCERATED SKIN AND UNDERLYING ACUTE INFLAMMATION Assessment/Plan: 71 y.o. male with PMHx as noted s/p left foot transmetatarsal amputation, partially OPEN (DOS: 10/15/2021), and now s/p  left foot metatarsal resections 1-5 with delayed primary closure, CLOSED (DOS: 10/25/2021).  Patient doing well postoperatively- Dressings changed: betadine DSD- Podiatric surgery will perform dressing changes as indicated- Nursing please reinforce dressing PRN with ABDs, 4x4 gauze, kerlix, ACE Abx- vanc 10/14/2021-present- zosyn 10/14/2021-present Consult- Medicine: for medical comanagement and pre-op risk stratification- VS: for perfusion assessment; perfusion deemed to be adequate vascular signed off- Infectious Disease: for antibiotic guidance and duration Diet: CCActivity: NWB to LLE, WBAT to RLEDVT Ppx: lovenoxCode: Full Dispo:   pending finalized ID recs, no further podiatric surgical intervention planned during this admission.Attending, Dr. Lesle Reek page on-call podiatry resident with questions:SEE Army Melia FOR CALL SCHEDULEElectronically Signed by Heidi Dach, DPM, May 7, 2023Discussed with resident, agree with plan. Electronically Signed by Paulene Floor, DPM, Oct 27, 2021

## 2021-10-28 NOTE — Other
[facility]	Podiatry Post-Operative Note Subjective: Pt seen at bedside in NAD. Denies pain to left foot. Denies n/v/f/c/sob/cp.S/p left foot metatarsal resections 1-5 with delayed primary closure, CLOSED (DOS: 10/25/2021).Objective: Vitals: Vitals:  10/25/21 0353 BP: 113/77 Pulse: (!) 56 Resp: 18 Temp: 97.7 ?F (36.5 ?C) Gen: AAOx3, NADChest: CTAB, no respiratory distress Cardio: RRR, S1, S2Left LE: Dressing c/d/i with no strikethrough present. Labs: Lab Results Component Value Date  WBC 7.2 10/25/2021  HGB 12.1 (L) 10/25/2021  HCT 37.40 (L) 10/25/2021  PLT 333 10/25/2021  NA 140 10/25/2021  K 4.3 10/25/2021  CREATININE 0.80 10/25/2021  BUN 14 10/25/2021  HSCRP 169.8 (H) 10/14/2021  SEDRATE 66 (H) 10/15/2021  INR 1.06 10/14/2021  GLU 143 (H) 10/25/2021 Micro/Path: Micro: 10/25/2021 - pending1. L foot 1st MT CM2. L foot 2nd MT CM3. L foot 4th MT CM4. L foot 5th MT CMPath: 10/25/2021 - pending1. L foot 1st MT CM2. L foot 2nd MT CM3. L foot 4th MT CM4. L foot 5th MT CM5. L foot MT resectionImaging: XR Left Foot: 10/25/2021 - pendingAssessment/Plan: 71 y.o. male s/p left  foot metatarsal resections 1-5 with delayed primary closure, CLOSED (DOS: 10/25/2021). Patient doing well post-operatively. - Resume pre-op medication regimen and diet- C/w Abx. - Podiatry will perform dressing changes starting POD1- Nursing: please reinforce dressing PRN for strike-through with DSD - NWB to left foot- PT consult placed with above WB status and restrictions- Elevate left lower extremity on 2+ pillows- Post-op x-rays ordered Dispo: Pending finalized ID recs, no further podiatric surgical intervention planned.Discussed with attending Dr. D'AngeloElectronically Signed by Heidi Dach, DPM, May 5, 2023Discussed with resident, agree with plan. Electronically Signed by Paulene Floor, DPM, Oct 25, 2021

## 2021-10-29 LAB — CBC WITH AUTO DIFFERENTIAL
BKR WAM ABSOLUTE IMMATURE GRANULOCYTES.: 0.02 x 1000/ÂµL (ref 0.00–0.30)
BKR WAM ABSOLUTE LYMPHOCYTE COUNT.: 1.65 x 1000/ÂµL (ref 0.60–3.70)
BKR WAM ABSOLUTE NRBC (2 DEC): 0 x 1000/ÂµL (ref 0.00–1.00)
BKR WAM ANALYZER ANC: 5.69 x 1000/ÂµL (ref 2.00–7.60)
BKR WAM BASOPHIL ABSOLUTE COUNT.: 0.08 x 1000/ÂµL (ref 0.00–1.00)
BKR WAM BASOPHILS: 1 % (ref 0.0–1.4)
BKR WAM EOSINOPHIL ABSOLUTE COUNT.: 0.16 x 1000/ÂµL (ref 0.00–1.00)
BKR WAM EOSINOPHILS: 1.9 % (ref 0.0–5.0)
BKR WAM HEMATOCRIT (2 DEC): 29.9 % — ABNORMAL LOW (ref 38.50–50.00)
BKR WAM HEMOGLOBIN: 9.6 g/dL — ABNORMAL LOW (ref 13.2–17.1)
BKR WAM IMMATURE GRANULOCYTES: 0.2 % (ref 0.0–1.0)
BKR WAM LYMPHOCYTES: 19.8 % (ref 17.0–50.0)
BKR WAM MCH (PG): 25.1 pg — ABNORMAL LOW (ref 27.0–33.0)
BKR WAM MCHC: 32.1 g/dL (ref 31.0–36.0)
BKR WAM MCV: 78.1 fL — ABNORMAL LOW (ref 80.0–100.0)
BKR WAM MONOCYTE ABSOLUTE COUNT.: 0.73 x 1000/ÂµL (ref 0.00–1.00)
BKR WAM MONOCYTES: 8.8 % (ref 4.0–12.0)
BKR WAM MPV: 8.4 fL (ref 8.0–12.0)
BKR WAM NEUTROPHILS: 68.3 % (ref 39.0–72.0)
BKR WAM NUCLEATED RED BLOOD CELLS: 0 % (ref 0.0–1.0)
BKR WAM PLATELETS: 292 x1000/ÂµL (ref 150–420)
BKR WAM RDW-CV: 14.9 % (ref 11.0–15.0)
BKR WAM RED BLOOD CELL COUNT.: 3.83 M/ÂµL — ABNORMAL LOW (ref 4.00–6.00)
BKR WAM WHITE BLOOD CELL COUNT: 8.3 x1000/ÂµL (ref 4.0–11.0)

## 2021-10-29 LAB — BASIC METABOLIC PANEL
BKR ANION GAP: 11 (ref 7–17)
BKR BLOOD UREA NITROGEN: 13 mg/dL (ref 8–23)
BKR BUN / CREAT RATIO: 14.4 (ref 8.0–23.0)
BKR CALCIUM: 9.8 mg/dL (ref 8.8–10.2)
BKR CHLORIDE: 106 mmol/L (ref 98–107)
BKR CO2: 21 mmol/L (ref 20–30)
BKR CREATININE: 0.9 mg/dL (ref 0.40–1.30)
BKR EGFR, CREATININE (CKD-EPI 2021): >60 mL/min/{1.73_m2} (ref >=60)
BKR GLUCOSE: 138 mg/dL — ABNORMAL HIGH (ref 70–100)
BKR POTASSIUM: 4 mmol/L (ref 3.3–5.3)
BKR SODIUM: 138 mmol/L (ref 136–144)

## 2021-10-29 LAB — VANCOMYCIN, TROUGH: BKR VANCOMYCIN TROUGH: 15 ug/mL (ref 10.0–15.0)

## 2021-10-29 LAB — MAGNESIUM: BKR MAGNESIUM: 1.7 mg/dL (ref 1.7–2.4)

## 2021-10-29 MED ORDER — FLUCONAZOLE 100 MG TABLET
100 mg | Freq: Every day | ORAL | Status: DC
Start: 2021-10-29 — End: 2021-11-02
  Administered 2021-10-30 – 2021-11-01 (×3): 100 mg via ORAL

## 2021-10-29 MED ORDER — METFORMIN IMMEDIATE RELEASE 500 MG TABLET
500 mg | Freq: Two times a day (BID) | ORAL | Status: DC
Start: 2021-10-29 — End: 2021-11-02
  Administered 2021-10-29 – 2021-11-01 (×6): 500 mg via ORAL

## 2021-10-29 MED ORDER — FLUCONAZOLE 200 MG TABLET
200 mg | Freq: Once | ORAL | Status: CP
Start: 2021-10-29 — End: ?
  Administered 2021-10-29: 22:00:00 200 mg via ORAL

## 2021-10-29 MED ORDER — DAPAGLIFLOZIN PROPANEDIOL 10 MG TABLET
10 mg | Freq: Every day | ORAL | Status: DC
Start: 2021-10-29 — End: 2021-11-02
  Administered 2021-10-30 – 2021-11-01 (×3): 10 mg via ORAL

## 2021-10-29 MED ORDER — FLUCONAZOLE 100 MG TABLET
100 mg | Freq: Two times a day (BID) | ORAL | Status: DC
Start: 2021-10-29 — End: 2021-10-29

## 2021-10-29 MED ORDER — MAGNESIUM SULFATE 2 GRAM/50 ML (4 %) IN WATER INTRAVENOUS PIGGYBACK
2 gram/50 mL (4 %) | INTRAVENOUS | Status: CP
Start: 2021-10-29 — End: ?
  Administered 2021-10-29 (×3): 2 mL/h via INTRAVENOUS

## 2021-10-29 NOTE — Plan of Care
Problem: Adult Inpatient Plan of CareGoal: Plan of Care ReviewOutcome: Interventions implemented as appropriate Plan of Care Overview/ Patient Status    Patient is AxOx4, VSS, afebrile, RA. HOH. Contact precautions maintained. Meds and nursing care explained, patient verbalized understanding. IV abx give per [facility]. No pain at this time. Patient SBA with RW, NWB to LLE. Continent of both, OOB to toilet. Urinal at bedside. Last BM 5/8. Independent with TxP. Dressings to LLE CDI, changes by podiatry. RLE wound with zeroform and kerlix per pt's preference. Skin tear to R forearm OTA. Sunburn to L hand, OTA, eucerin cream at bedside. Safety measures maintained. Call bell within reach. Bed alarm on. See flowsheets for complete assessment. Continue to monitor. Jeannie Fend, RN

## 2021-10-29 NOTE — Telephone Encounter
Attending Surgeon: Dr. Matthias Hughs                 Other Surgeon/Resident: Wadie Lessen               Contact Person: [id number] Surgery Date: 10/15/2021 Inhouse Location:  [id number]  Location:     [facility]           Time preferred: 4PM         Priority LEVEL: 2              ? POST OP ICU BED  Medical Record Number: [id number]           Birthdate: [birth year]                Gender: maleLast Name: Edwin [name]   First Name:  Edwin Edwin [name]          Middle Initial: CPatient Allergies: Patient has no known allergies.                           ?Known Latex AllergySpecial Needs:  ?Hard of hearing   ?Deaf - needs sign language interpreter   ?Interpreter Required/Language: ____________                            __ Other                       Anticipated Anesthesia:    MAC with local anesthesiaSide of Body:        Left      CPT Code:       28002      Surgeon?s Description:       Left foot excisional debridement of all nonviable soft tissue and bone including Incision and Drainage      Diagnosis:        Left foot Abscess      ICD-10 Codes        L02.612      Estimated Length of Surgery:     0.5 hours  Special Instructions for [id number]:        Supine                                                  Tables:           Implants: _____                                                              Vendor:           Special Instruments:       Pod Regular kit; pulselavac, Sagittal Saw, #38 blade                             Special Equipment:

## 2021-10-29 NOTE — Consults
[facility] Infectious Diseases Initial Consult Progress Note____________________________________________________________Subjective Referring provider: Attending Provider: Paulene Floor, [place] [phone] Day: 15History obtained from chart review and the patient.History of Present Illness:Edwin [name] is a 71 y.o., male with a PMH of DM c/b neuropathy, multiple podiatric amputations, CAD with PCI and stents, CABG 2022 whom we have been consulted for osteomyelitis.He is from [place], travel to [place] for work. He was well until March 8th when he noticed callous on L lower extremity anterior sole, showed to PCP in [place] and performed I/D at that time and tx with oral doxycycline. While traveling here, he noticed the ulcer from the I&D did not heal, with some purulent drainage and he also had low grade fever and some chills. He was in contact with his PCP and his PCP in [place] started amoxicillin-clavulanate and trimethoprim-sulfamethoxazole. He later sent photo of his foot to PCP and was advised to go to hospital. He was admitted to hospital on 4/24. Xray of the left foot with Postsurgical changes with findings concerning for osteomyelitis of the first metatarsal head, hallux sesamoid bone and possibly at the resection margin of the fifth metatarsal as well. MRI of left foot 4/25 showed Bone erosions with marked joint space narrowing, subchondral cysts and marrow edema of the navicular-cuneiform articulations appear chronic and could represent chronic osteomyelitis, Charcot arthropathy and/or severe osteoarthrosis.He underwent left foot TMA on 4/25, op note with liquefactive necrosis, with 2 cc of purulence expressed, good capillary refill, wound left open partially. He was on vancomycin and piperacillin-tazobactam After several days, culture and path came back with OM at clean margin, S aureus, S viridans and PsA from culture. Decision to do revised TMA on 5/5 with more bone taken out. Wound was closedID is now consulted to help with Abx managementOther ID history and antibiotics exposurePatient has had multiple previous infections on his feet on both sides. Previous courses of cephalexin, doxycycline, trimethoprim-sulfamethoxazole, amoxicillin-clavulanate, linezolid  for cellulitis and osteomyelitisAmputation left 1st and 5th toe 10/19/2022Right 3 through 5 transmetatarsal amputation on 01/27/2020, discharged on clindamycin and metronidazole He said that in December 2022, he had an MRI of his left foot which show osteomyelitis so he received 6 weeks of ceftriaxone daptomycin, ended in February 2023.  He got a PICC line for thatFrom review of chart in care everywhere, he had wound culture which grew MRSA, Bacteroides and Candida albicans in the pastAntibiotics allergyNo known antibiotic allergyAntimicrobialsVancomycin and piperacillin-tazobactam from 4/24Microbiology/VirologyDeep wound cultureLeft foot drainage 4/24: mixed aerobic gram positive and negative organisms, PsA and MRSALeft dorsal foot drainage 4/24: mixed aerobic gram positive and negative organisms, PsA OR culture 4/25:Left 4th metatarsal clean margin: 1 CFU MRSALeft 2nd toe bone: strep viridansleft 3rd metatarsal clean margin: NGleft 5th metatarsal clean margin: MRSAleft 3rd toe bone: S viridansleft foot tissue: PsAleft 2nd metatarsal clean margin: NGleft 1st metatarsal clean margin: NGOR culture 5/5:left 2nd metatarsal clean margin: broth growing yeastleft 5th metatarsal clean margin: NGTDleft 1st metatarsal clean margin: NGTDLeft 4th metatarsal clean margin: NGTDPrevious culture from wound (on Care Everywhere): MRSA, Candida albicans, E faecalisPath:?10/15/2021?- FINAL DIAGNOSIS 1. ?LEFT 2ND TOE BONE, BIOPSY: ? ? ? ?- ACUTE OSTEOMYELITIS 2. ?LEFT 3RD TOE BONE, BIOPSY: ? ? ? ? ? ? - ARTICULAR CARTILAGE AND VIABLE BONE WITH ACUTE OSTEOMYELITIS AND FEATURES OF OSTEOARTHRITIS ? ? ?- VIABLE SOFT TISSUE WITH ACUTE AND CHRONIC INFLAMMATION 3. ?LEFT FOOT TISSUE, DEBRIDEMENT: ? ? ? ? ? ? - BONE WITH ACUTE OSTEOMYELITIS AND OVERLYING ACUTELY INFLAMED SOFT TISSUE 4. ?LEFT 1ST METATARSAL, CLEAN MARGIN: ? ? ? ? ? ? -  VIABLE BONE WITH FATTY MARROW AND AGGREGATES OF ACUTE INFLAMMATORY CELLS CONSISTENT WITH FOCAL ACUTE OSTEOMYELITIS 5. ?LEFT 2ND METATARSAL, CLEAN MARGIN: ? ? ? ? ? ? - VIABLE AND FOCALLY DEVITALIZED BONE WITH FATTY MARROW AND ACUTE INFLAMMATORY CELLS CONSISTENT WITH FOCAL ACUTE OSTEOMYELITIS 6. ?LEFT 3RD METATARSAL CLEAN MARGIN: ? ? ? ? ? ? - VIABLE BONE WITH FATTY MARROW AND SCATTERED ACUTE INFLAMMATORY CELLS BUT NO EVIDENCE OF OSTEOMYELITIS ? ? ?- VIABLE SOFT TISSUE WITHOUT ACUTE INFLAMMATION 7. ?LEFT 4TH METATARSAL, CLEAN MARGIN: ? ? ? ? ? ? - ?VIABLE BONE WITH FATTY MARROW AND NO EVIDENCE OF OSTEOMYELITIS ? ? ?- VIABLE SOFT TISSUE WITHOUT ACUTE INFLAMMATION 8. ?LEFT 5TH METATARSAL, CLEAN MARGIN: ? ? ? ? ? ? - VIABLE SOFT TISSUE WITH PERIOSTEAL REACTIVE BONE FORMATION BUT NO MATURE BONE OR INFLAMMATION  9. ?LEFT FOOT, TRANSMETATARSAL AMPUTATION: ? ? ? ? ? ? - THIRD, FOURTH, AND FIFTH TOES WITH ARTICULAR CARTILAGE AND VIABLE BONE WITH FATTY MARROW, RARE ACUTE INFLAMMATORY CELL, BUT NO EVIDENCE OF OSTEOMYELITIS, AND VIABLE SOFT TISSUE WITH VERY FOCAL ACUTE INFLAMMATION ? ? ? - THREE OF FOUR SEPARATE SPECIMENS EXHIBITING ACUTE OSTEOMYELITIS ? ? ?- DORSAL AND PLANTER SURGICAL MARGINS EXHIBITING ERODED AND ULCERATED SKIN AND UNDERLYING ACUTE INFLAMMATION Pathology 5/5: pendingExposure HistoryPatient lives in [place].  He traveled all over the country for work.  No pets at home, no exposure to animal to any of his woundsReview of Systems10 point review of systems reviewed and is negative if not described in HPIPast Medical HistoryEdward C Simson  has no past medical history on file.Past Surgical HistoryEdward C Dorner  has no past surgical history on file.Social HistoryEdward C Cavanah has no history on file for tobacco use, alcohol use, and drug use.Family Historyfamily history is not on file.ImmunizationsThere is no immunization history on file for this patient.MedicationsCurrent Facility-Administered Medications Medication Dose Route Frequency Provider Last Rate Last Admin ? aspirin chewable tablet 81 mg  81 mg Oral Daily Fasihuddin, Rafi M., DPM   81 mg at 10/28/21 0827 ? bupivacaine PF (MARCAINE) 0.5 % (5 mg/mL) injection          ? bupivacaine PF (MARCAINE) 0.5 % (5 mg/mL) injection          ? cyanocobalamin tablet 1,000 mcg  1,000 mcg Oral Daily Fasihuddin, Mliss Fritz., DPM   1,000 mcg at 10/28/21 0827 ? enoxaparin (LOVENOX) syringe 40 mg  40 mg Subcutaneous Daily Fasihuddin, Rafi M., DPM   40 mg at 10/28/21 2956 ? gabapentin (NEURONTIN) capsule 300 mg  300 mg Oral TID Heidi Dach., DPM   300 mg at 10/28/21 2037 ? insulin lispro (Admelog, HumaLOG) Sliding Scale (See admin instructions for dose) 1-18 Units  1-18 Units Subcutaneous TID AC Fasihuddin, Rafi M., DPM   1 Units at 10/28/21 1321 ? insulin lispro (Admelog, HumaLOG) Sliding Scale (See admin instructions for dose) 1-18 Units  1-18 Units Subcutaneous Nightly Fasihuddin, Rafi M., DPM     ? metoprolol tartrate (LOPRESSOR) Immediate Release tablet 25 mg  25 mg Oral BID Verne Carrow M., DPM   25 mg at 10/28/21 2037 ? piperacillin-tazobactam (ZOSYN) 4.5 g in sodium chloride 0.9% 100 mL (mini-bag plus)  4.5 g Intravenous Q6H Fasihuddin, Rafi M., DPM 33.3 mL/hr at 10/29/21 0237 4.5 g at 10/29/21 0237 ? polyethylene glycol (MIRALAX) packet 17 g  17 g Oral Daily Fasihuddin, Mliss Fritz., DPM   17 g at 10/27/21 0853 ? rosuvastatin (CRESTOR) tablet 10 mg  10 mg Oral Q48H Fasihuddin,  Mliss Fritz., DPM   10 mg at 10/28/21 0827 ? senna (SENOKOT) tablet 8.6 mg  1 tablet Oral Nightly Fasihuddin, Mliss Fritz., DPM   8.6 mg at 10/28/21 2037 ? sodium chloride 0.9 % flush 3 mL  3 mL IV Push Q8H Fasihuddin, Rafi M., DPM   3 mL at 10/28/21 2351 ? sodium hypochlorite (DAKINS) 0.125 % external solution   Topical (Top) Daily Heidi Dach., DPM   Given at 10/28/21 0830 ? vancomycin (VANCOCIN) 1 g in sodium chloride 0.9% 250 mL IVPB (vialmate)  1 g Intravenous Q12H Fasihuddin, Rafi M., DPM 250 mL/hr at 10/29/21 0039 1 g at 10/29/21 0039 ? vancomycin (VANCOCIN) 1.5 gram injection          ? Vancomycin MAR Level   Intravenous Once Fasihuddin, Mliss Fritz., DPM     AllergiesAllergies Allergen Reactions ? Lipitor [Atorvastatin] Intolerance   Joint pain (unclear if myalgia) _______________________________________________________________________ Objective Physical ExaminationLast 24 hours: Temp:  [98.2 ?F (36.8 ?C)-99.1 ?F (37.3 ?C)] 99.1 ?F (37.3 ?C)Pulse:  [70-103] 70Resp:  [18-20] 20BP: (111-152)/(66-85) 127/76SpO2:  [94 %-99 %] 97 %02 sat and device used: SpO2: 97 %Device (Oxygen Therapy): room airPhysical ExamGeneral: older male, alert, not in acute distressHeart: regular rate and rhythm, no murmur or gallopLung: clear lung sound bilaterally, no crackles or wheezesAbdomen: soft, non tender, normal bowel sounds, no hepatosplenomegalyMSK: left foot in dressing, right foot with only 1st toe (other toes amputated), 1 cm dry, clean ulcer on soleSkin: warm and dry, no rashesNeuro: awake, alert and oriented x 3. Lines and DrainsPIVLaboratory examinationRecent Labs Lab 05/09/230503 WBC 8.3 HGB 9.6* HCT 29.90* PLT 292 MCV 78.1* NEUTROPHILS 68.3 LYMPHOCYTES 19.8 LYMPHABS 1.65 EOSINOPHILS 1.9 EOSABS 0.16 Recent Labs Lab 05/09/230503 05/09/230826 NA 138  --  K 4.0  --  CL 106  --  CO2 21  --  BUN 13  --  CREATININE 0.90  --  GLU 138* 134* No results for input(s): AST, ALT, ALKPHOS, BILITOT, ALBUMIN in the last 168 hours.Imaging StudiesNo results found.________________________________________________________________________ ________________________________________________________________________Assessment:Edwin [name] is a 71 y.o., male with a PMH of DM c/b neuropathy, multiple podiatric amputations, CAD with PCI and stents, CABG 2022 whom we have been consulted for osteomyelitisPatient with previous episode of osteomyelitis of bilateral feet with multiple amputation now presented with a wound on his left foot, imaging concern for osteomyelitis.  He underwent TMA on April 25th with pathology show osteomyelitis of the margins.  Culture grew mixed bacteria, including MRSA, Pseudomonas and strep viridans.  He had a revised TMA on May 5th with culture from clean margin now grew yeast.  Of note, he had history of OM with MRSA in the past and also had history of Candida albicans grew from one of his wound culture.  I was not able to identify whether those cultures were from bonesSince no Pseudomonas was identified from clean margins, would stop piperacillin-tazobactam Continue vancomycin for MRSA for now, if later on no MRSA grows from bone (5/5 culture), can stop vancomycin as wellSince yeast grew from clean margin, would start fluconazoleRecommendations:Diagnostics-follow up on culture from OR (5/5)-follow up on pathologyTreatment-stop piperacillin-tazobactam -continue vancomycin -start fluconazoleThank you for this consult.  Patient discussed with Dr. Wynema Birch.  Addendum to follow.For questions or clarifications, please contact me via Mobile Heartbeat. Please contact the on-call ID fellow if after 5:00 pm.Itzelle Gains B Cyndie Chime, MDInfectious Disease Fellow

## 2021-10-29 NOTE — Other
VANCOMYCIN LEVEL EVALUATIONCurrent Vancomycin Order: 1 g IV every 12 hours. Day of Therapy: 17Vancomycin Indication: MRSA in DWCx in addition to S viridans and PsAEstimated Creatinine Clearance: 83 mL/min (by C-G formula based on SCr of 0.9 mg/dL).Creatinine (mg/dL) Date Value 16/03/9603 0.90 10/28/2021 1.00 10/27/2021 1.10  Renal Function: StableLevel: Lab Results Last 72 Hours Component Value Date/Time  Vancomycin Trough 15.0 10/29/2021 10:24 AM  Vancomycin Trough 17.5 (H) 10/27/2021 08:18 AM Type of Level: Trough; Level drawn appropriately? NoObtained 10h post-doseBased on vancomycin level obtained, recommend:  Continue current regimenRepeat Level:  Obtain level on Friday, not yet orderedID/AST Consulted? Yes [facility] Vancomycin Dosing GuidelineBH Vancomycin Dosing GuidelineFor questions, please contact the pharmacist: Dawayne Cirri, PharmD    Phone/Mobile Heartbeat: M[facility]

## 2021-10-29 NOTE — Other
Inpatient Medicine - Consult NoteHospital day: 15Today: Pt seen at bedside this morning in NAD. Denies pain to the BLE. Reports minimal upper extremity joint pain today. States that he is awaiting final abx recommendations. He denies nausea, vomiting, diarrhea, constipation, SOB, and chest pain. ObjectiveBP 121/78  - Pulse 77  - Temp 98.5 ?F (36.9 ?C) (Oral)  - Resp 20  - Ht 5' 11 (1.803 m)  - Wt 89.1 kg  - SpO2 98%  - BMI 27.41 kg/m?  Exam:General: Pleasant man lying comfortably in bedCardiac: Regular rhythm and rate, normal S1 and S2 with no murmursPulmonary: Breathing comfortably on room air, CTABMsk: Continued minimal pitting edema to the LLE. No edema on the right. No erythema. Left foot surgical dressing intact without strike-through. Neuro: Alert, conversant, responding to questions adequatelyLabs notable forCreatinine 1 --> 1.1 --> 1 --> 0.9Fasting glucose 131Hgb 11.7 --> 12.1 --> 9.8 --> 10.2 --> 9.9 --> 9WBC 9.5 --> 8.1 --> 8.5Plt 294 --> 321 --> 276Mg  1.8 --> 1.5 Micro 5/5 deep wound cultures - NGTD5/5 Left foot second metatarsal clean margin growing Candida albicans in broth culture only5/5 PathologyNegative for OM at clean marginImaging 5/5 foot X-ray: The patient is status post interval revision of the transmetatarsal amputation.Soft tissue swelling , skin staples and air are in keeping with postsurgical changes.Redemonstrated is a small metallic density at the plantar aspect of the calcaneus. Assessment & PlanMs. Kanner is a 71 year old man with a history of  Diabetes, CAD s/p CABG in 2022 and remote stenting, who was admitted for left foot osteomyelitis s/p I&D and TMA, now status post delayed primary closure on 5/5. Wound cultures from initial surgery grew staph aureus, pseudomonas, and viridans. He continues to do well post-operatively and is awaiting finalized antibiotic plans. # left foot infection- antibiotics, pain, and surgical plan per podiatry- ID recommending discontinuing zosyn. Also recommend continuing vanc and starting fluconazole- Per discussion with pharmacy and ID, 800mg  loading dose today followed by 400mg  daily thereafter for fluconazole- Pathology negative for OM at clean margins - Repeat EKG ordered # type 2 diabetes - A1c 6.8 in 06/2021. Glucose well controlled off basal, with sliding scale use only thus far.- Given his blood glucose is well controlled at this time, recommend discontinuing sliding scale insulin and finger sticks at this time. - he will restart his home regimen of metformin and dapagliflozin tomorrow (ordered)- neuropathic pain - continue gabapentin 300 mg TID# Creatinine elevation - mild increase in creatinine from 0.8 starting on 5/6. Normal PO intake, and urine output has not been documented. Differential includes pre-renal AKI from hypovolemia, pre-renal AKI from hypotension, variation of creatinine within baseline range.- Cr continued to improve, CTM- encourage PO intake# Soft blood pressures - asymptomatic hypotension with spontaneous resolution over the weekend. Periods of hypotension are not always accompanied by bradycardia, so unclear if dose reduction of metoprolol would be helpful. No evidence of hypotension secondary to infection.- BP and HR stable and well controlled at this time. - continue metoprolol- can consider dose reduction to 12.5 mg BID if persistent hypotension and bradycardia# CAD- continue aspirin 81 mg daily- continue metoprolol tartrate 25 mg BID- continue rosuvastatin 10 mg every 48 hours (limited in the setting of statin associated myalgias)# Vitamin B12 deficiency and microcytic anemia- continue cyanocobalamin 1000 mg daily- Hgb 9 & Hct 28.1 today. No bleeding from the left foot surgical site per podiatry. - Patient denying dark stools.- Continue to monitorElectronically Signed by Cleaster Corin, DPM, May 9, 2023Plans subject to change  with attending's addendum.

## 2021-10-29 NOTE — Plan of Care
Plan of Care Overview/ Patient Status    Problem: Adult Inpatient Plan of CareGoal: Plan of Care ReviewOutcome: Interventions implemented as appropriatePatient is A+O times 4, pleasant. Meds and nursing care explained, patient verbalized understanding. Afebrile, vital signs stable. No complaints of pain. Patient on room air. Assist x 1 RW in room, up to toilet. Last BM today. NWB to LLE. Dressing to lower extremities done by podiatry. Skin tear to forearm OTA. IV abx given as scheduled. Pending wound cultures. Safety and contact measures maintained. T&P self. See doc flow sheet for complete assessment. Continue to monitor. Orene Abbasi, RN5pmWound culture came back with candida, started on diflucan, zosyn dc'd.

## 2021-10-29 NOTE — Plan of Care
Inpatient Physical Therapy Progress Note IP Adult PT Eval/Treat - 10/29/21 1056    Date of Visit / Treatment  Date of Visit / Treatment 10/29/21   Note Type Progress Note   Progress Report Due 10/30/21   Start Time 1031   End Time 1056   Total Treatment Time 25 min    General Information  Pertinent History Of Current Problem s/p primary closure of L TMA on 5/5   Subjective I don't have stairs at my house in [place]   General Observations Pt seated in recliner, RA NAD, L foot wrapped, agreeable to PT   Precautions/Limitations Fall Precautions   Precautions/Limitations Comment NWB LLE    Weight Bearing Status  Weight Bearing Status Comments NWB LLE    Vital Signs and Orthostatic Vital Signs  Vital Signs Free text RA NAD    Pain/Comfort  Pain Comment (Pre/Post Treatment Pain) no c/o pain    Cognition  Overall Cognitive Status WFL    Skin Assessment  Skin Assessment See Nursing Documentation    Balance  Sitting Balance: Static  FAIR+     Maintains static position without assist or device, may require Supervision or Verbal Cues (>2 minutes)   Sitting Balance: Dynamic  FAIR+    Performs dynamic activities through full range with Supervision   Standing Balance: Static FAIR+     Maintains static position without assist or device, may require Supervision or Verbal Cues (>2 minutes)   Standing Balance: Dynamic  FAIR-     Performs dynamic activities through partial range (50-75%) with Contact Guard   Balance Assist Device Rolling walker    Bed Mobility  Bed Mobility Comments in recliner pre/post session    Sit-Stand Transfer Training  Sit-to-Stand Transfer Independence/Assistance Level Contact guard;Verbal cues   Sit-to-Stand Transfer Assist Device Rolling walker   Stand-to-Sit Transfer Independence/Assistance Level Contact guard;Verbal cues   Stand-to-Sit Transfer Assist Device Rolling walker   Sit-Stand Transfer Comments good adherance to LLE NWB    Gait Training  Independence/Assistance Level  Contact guard;Minimum assist;Assist of 1   Assistive Device  Rolling walker   Gait Distance 25 feet;x2   Gait Training Comments CG(A) on hard floors, required min(A) on carpetted floors as fatigued 2/2 sock sticking; very good awareness of NWB and fatigue levels. intermittent steadying assist provided    Handoff Documentation  Handoff Patient in chair;Patient instructed to call nursing for mobility;Discussed with nursing    Endurance  Endurance Comments fair    PT- AM-PAC - Basic Mobility Screen- How much help from another person do you currently need.....  Turning from your back to your side while in a a flat bed without using rails? 4 - None - Does not require any help and does the activity independently. Can use assistive devices.   Moving from lying on your back to sitting on the side of a flat bed without using bed rails? 4 - None - Does not require any help and does the activity independently. Can use assistive devices.   Moving to and from a bed to a chair (including a wheelchair)? 3 - A Little - Requires a little help (supervision, minimal assistance). Can use assistive devices.   Standing up from a chair using your arms(e.g., wheelchair or bedside chair)? 3 - A Little - Requires a little help (supervision, minimal assistance). Can use assistive devices.   To walk in a hospital room? 3 - A Little - Requires a little help (supervision, minimal assistance). Can use assistive devices.  Climbing 3-5 steps with a railing? 2 - A Lot - Requires a lot of help (maximum to moderate assistance). Can use assistive devices.   AMPAC Mobility Score 19   TARGET Highest Level of Mobility Mobility Level 6, Walk 10+steps    Therapeutic Exercise  Therapeutic Exercise Comments Seated marching, LAQ, RLE ankle pumps and drawing the alphabet with R ankle, chair push ups, SLR, quad sets    Clinical Impression  Follow up Assessment Pt tolerated PT f/u visit well. He is compliant with LLE NWB with all mobility. He fatigued quickly on carpet this date, but has good awareness of fatigue and when needing assist. Reports he enters his basement and has no STE his home. His son will be present to assist as needed. D/c pending final I&D recs; plan to d/c home with family support and services.   Criteria for Skilled Therapeutic Interventions Met yes;treatment indicated   Rehab Potential good, to achieve stated therapy goals    Frequency/Equipment Recommendations  PT Frequency 3x per week   What day of week is next treatment expected? Thursday   PT/PTA completing this assessment [place]   Equipment Needs During Admission/Treatment Rolling walker    PT Recommendations for Inpatient Admission  Activity/Level of Assist ambulate;assist of 1;with rolling walker   NWB LLE   Planned Treatment / Interventions  Education Treatment / Interventions Patient Education / Training   NWB LLE, transfers, ambulating on hard floor vs carpet, safety, dispo   PT Discharge Summary  Physical Therapy Disposition Recommendation Home   Additional Physical Therapy Disposition Recommendations Home Physical Therapy;Home with family support   Equipment Recommendations for Discharge Rolling walker     Sallee Lange, DPTMHB: [phone]

## 2021-10-30 LAB — BASIC METABOLIC PANEL
BKR ANION GAP: 14 (ref 7–17)
BKR BLOOD UREA NITROGEN: 15 mg/dL (ref 8–23)
BKR BUN / CREAT RATIO: 15 (ref 8.0–23.0)
BKR CALCIUM: 10.9 mg/dL — ABNORMAL HIGH (ref 8.8–10.2)
BKR CHLORIDE: 102 mmol/L (ref 98–107)
BKR CO2: 21 mmol/L (ref 20–30)
BKR CREATININE: 1 mg/dL (ref 0.40–1.30)
BKR EGFR, CREATININE (CKD-EPI 2021): >60 mL/min/{1.73_m2} (ref >=60)
BKR GLUCOSE: 153 mg/dL — ABNORMAL HIGH (ref 70–100)
BKR POTASSIUM: 4.9 mmol/L (ref 3.3–5.3)
BKR SODIUM: 137 mmol/L (ref 136–144)

## 2021-10-30 LAB — DEEP WOUND CULTURE   (BH GH LMW YH)
BKR DEEP WOUND CULTURE: NO GROWTH
BKR DEEP WOUND CULTURE: NO GROWTH
BKR GRAM STAIN (ROUTINE): NONE SEEN
BKR GRAM STAIN (ROUTINE): NONE SEEN
BKR GRAM STAIN (ROUTINE): NONE SEEN
BKR GRAM STAIN (ROUTINE): NONE SEEN — AB

## 2021-10-30 LAB — C-REACTIVE PROTEIN     (CRP): BKR C-REACTIVE PROTEIN, HIGH SENSITIVITY: 60.3 mg/L — ABNORMAL HIGH

## 2021-10-30 MED ORDER — WALKER
1 refills | Status: AC
Start: 2021-10-30 — End: ?

## 2021-10-30 NOTE — Progress Notes
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 and subsequent resection 5/54/25 OR cultures had grown pseudomonas aeruginosa, MRSA, strep viridans, mixed aerobes with the clean margins only growing MRSA5/5 OR cultures growing c albicans in clean margin 2nd/1st metatarsal Path 5/5 showed no more OM in the clean marginsReceived piperacillin-tazobactam 4/24-5/9Received vancomycin 4/24-5/9Fluconazole 400mg  daily started 5/9RecommendationsGiven no bacterial growth on cultures from 5/5 and path showed clean margins free of OM - no further need for antibacterial coverage --> can d/c IV vancomycinGrowth of yeast noted -- will need to cover with fluconazole 400mg  daily x at least 6 months. Given growth in bone specimen - will need to treat as fungal OM though neg OM in pathRecommend get cbc, cmp within 2 weeks to be reviewed by [place] providersPt will follow up with Dr. Hanley Hays (ID in [place])Would do MRSA decolonization - with hibiclens soap from neck down x 7 days monthly for 6 months and mupirocin ointment to nose BID x 7 days monthly for 6 months as well. Discussed with patient____________________Subjective Referring provider: Attending Provider: Paulene Floor, [place] [phone] provided by: patient and EMRHospital Day: 16Chart reviewedNo acute events overnightReview of Systems Constitutional: Negative for fever. Gastrointestinal: Negative for diarrhea. Allergy list reviewed.I reviewed the patient's current medications.Current antimicrobials include: IV vanco  + PO fluconazoleObjective Laboratory examination Labs reviewed today are as follows: Recent Labs Lab 05/09/230503 WBC 8.3 HGB 9.6* HCT 29.90* PLT 292 MCV 78.1* NEUTROPHILS 68.3 LYMPHOCYTES 19.8 LYMPHABS 1.65 EOSINOPHILS 1.9 EOSABS 0.16 Recent Labs Lab 05/09/230503 NA 138 K 4.0 CL 106 CO2 21 BUN 13 CREATININE 0.90 4/24 Pre OR cultures - mixed aerobes, pseudomonas aeruginosa + MRSA4/25 OR cultures - ?Left 2nd toe bone - strep viridansLeft 4th metatarsal clean margin:  MRSALeft foot tissue - mixed aerobes, pseudomonas aeruginosa Left 3rd toe bone - strep viridansLeft 3rd metatarsal clean margin - no growthLeft 5th metatarsal clean margin - MRSALeft 1st metatarsal clean margin - No growthLeft 2nd metatarsal clean margin - No growth?5/5 OR cultures?Left 2nd metatarsal clean margin #2 - + candida albicans (broth only)Left 4th metatarsal clean margn #2 - NGTDLeft 5th metatarsal clean margin #2 -NGTDLeft 1st metatarsal clean margin #2 - + yeast (broth only)5/ PathFINAL DIAGNOSIS 1. ?LEFT 1ST METATARSAL CLEAN MARGIN #2: ? ? ? ? ? ? - REMODELING BONE AND FATTY MARROW, NEGATIVE FOR OSTEOMYELITIS 2. ?LEFT 2ND METATARSAL CLEAN MARGIN #2: ? ? ? ?- VIABLE CORTICLE BONE, NEGATIVE FOR OSTEOMYELITIS 3. ?LEFT 4TH METATARSAL CLEAN MARGIN #2: ? ? ? ? ? ? - VIABLE BONE AND FATTY MARROW, NEGATIVE FOR OSTEOMYELITIS 4. ?LEFT 5TH METATARSAL CLEAN MARGIN #2: ? ? ? ?- REMODELING CORTICLE BONE, NEGATIVE FOR OSTEOMYELITIS 5. ?LEFT FOOT METATARSAL RESECTION: ? ? ? ?- ACUTE OSTEOMYELITIS X1 ? ? ?- CHRONIC INACTIVE OSTEOMYELITIS X3 Imaging Studies Physical ExaminationVitals:BP: (!) 147/89Temp: 98.3 ?F (36.8 ?C)Pulse: 77Resp: 18SpO2: 97 %Device (Oxygen Therapy): room airPhysical ExamMusculoskeletal:    Comments: Left foot surgical dressing in place Neurological:    Mental Status: He is alert.
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
## 2021-10-30 NOTE — Progress Notes
[facility]	Podiatry Progress Note5/10/2023Subjective: No events overnight. Pt is in agreeable manner. Denies pain to the left LE.  Patient has wear a plan going forward including awaiting finalized ID recommendations.  Denies f/n/v/cp/sob.S/p?left?foot metatarsal resections 1-5 with delayed primary closure, CLOSED?(DOS: 10/25/2021).S/p?left?foot transmetatarsal amputation, partially OPEN?(DOS: 10/15/2021).Objective: VitalsTemp:  [98.2 ?F (36.8 ?C)-98.5 ?F (36.9 ?C)] 98.4 ?F (36.9 ?C)Pulse:  [70-86] 86Resp:  [18-20] 18BP: (114-153)/(65-85) 153/85SpO2:  [97 %-100 %] 97 %Gen: Alert, no distressLeft foot: Dressing left intact.  Clean dry and intact.Labs: Recent Labs   05/07/230818 05/07/230836 05/08/230559 05/08/230832 05/09/230503 05/09/230826 05/09/231738 WBC 8.1  --  8.5  --  8.3  --   --  HGB 9.9*  --  9.0*  --  9.6*  --   --  HCT 32.10*  --  28.10*  --  29.90*  --   --  CREATININE 1.10  --  1.00  --  0.90  --   --  BUN 14  --  15  --  13  --   --  CO2 22  --  21  --  21  --   --  PLT 321  --  276  --  292  --   --  GLU 114*   < > 131*   < > 138*   < > 122* K 4.4  --  4.0  --  4.0  --   --  NA 139  --  139  --  138  --   --  CL 107  --  107  --  106  --   --   < > = values in this interval not displayed. Imaging: XR Left Foot: 5/5/23FINDINGS:The patient is status post interval revision of the transmetatarsal amputation.Soft tissue swelling , skin staples and air are in keeping with postsurgical changes.Redemonstrated is a small metallic density at the plantar aspect of the calcaneus.IMPRESSION: Postsurgical changes, as above.?XR?Left?Foot: 10/15/2021?FINDINGS:Patient is status post interval transmetatarsal amputation at the level of the mid shaft.Mild degenerative changes are noted in the included midfoot with small ossicles at the dorsal aspect of the talonavicular joint, in keeping with a sequel of prior injury.Soft tissue swelling and air is in keeping with postsurgical changes.IMPRESSION: Post surgical changes, as above.Micro/Path: Micro:?10/25/2021?- prelim1.?L foot 1st MT CM:  Yeast broth only2. L foot 2nd MT CM:  Candida albicans3. L foot 4th MT CM: NGTD4. L foot 5th MT CM: NGTD?Path:?10/25/2021?- final1. ?LEFT 1ST METATARSAL CLEAN MARGIN #2: ? ? ? ? ? ? - REMODELING BONE AND FATTY MARROW, NEGATIVE FOR OSTEOMYELITIS 2. ?LEFT 2ND METATARSAL CLEAN MARGIN #2: ? ? ? ?- VIABLE CORTICLE BONE, NEGATIVE FOR OSTEOMYELITIS 3. ?LEFT 4TH METATARSAL CLEAN MARGIN #2: ? ? ? ? ? ? - VIABLE BONE AND FATTY MARROW, NEGATIVE FOR OSTEOMYELITIS 4. ?LEFT 5TH METATARSAL CLEAN MARGIN #2: ? ? ? ?- REMODELING CORTICLE BONE, NEGATIVE FOR OSTEOMYELITIS 5. ?LEFT FOOT METATARSAL RESECTION: ? ? ? ?- ACUTE OSTEOMYELITIS X1 ? ? ?- CHRONIC INACTIVE OSTEOMYELITIS X3 ??Micro:?10/15/2021?-? final1.?Left?2nd toe wound -  strep viridans2.?Left 3rd toe bone -  strep viridans3.?Left foot tissue - Pseudomonas aeruginosa, mixed GP/GN organism4.?Left 1st metatarsal clean margin -  NGTD5.?Left 2nd metatarsal clean margin - NGTD6.?Left?3rd?metatarsal clean margin - NGTD7.?Left 4th metatarsal clean margin- SA8.?Left 5th metatarsal clean margin- SA?Path:?10/15/2021?- FINAL DIAGNOSIS 1. ?LEFT 2ND TOE BONE, BIOPSY: ? ? ? ?- ACUTE OSTEOMYELITIS 2. ?LEFT 3RD TOE BONE, BIOPSY: ? ? ? ? ? ? - ARTICULAR CARTILAGE AND VIABLE BONE WITH ACUTE OSTEOMYELITIS AND  FEATURES OF OSTEOARTHRITIS ? ? ?- VIABLE SOFT TISSUE WITH ACUTE AND CHRONIC INFLAMMATION 3. ?LEFT FOOT TISSUE, DEBRIDEMENT: ? ? ? ? ? ? - BONE WITH ACUTE OSTEOMYELITIS AND OVERLYING ACUTELY INFLAMED SOFT TISSUE 4. ?LEFT 1ST METATARSAL, CLEAN MARGIN: ? ? ? ? ? ? - VIABLE BONE WITH FATTY MARROW AND AGGREGATES OF ACUTE INFLAMMATORY CELLS CONSISTENT WITH FOCAL ACUTE OSTEOMYELITIS 5. ?LEFT 2ND METATARSAL, CLEAN MARGIN: ? ? ? ? ? ? - VIABLE AND FOCALLY DEVITALIZED BONE WITH FATTY MARROW AND ACUTE INFLAMMATORY CELLS CONSISTENT WITH FOCAL ACUTE OSTEOMYELITIS 6. ?LEFT 3RD METATARSAL CLEAN MARGIN: ? ? ? ? ? ? - VIABLE BONE WITH FATTY MARROW AND SCATTERED ACUTE INFLAMMATORY CELLS BUT NO EVIDENCE OF OSTEOMYELITIS ? ? ?- VIABLE SOFT TISSUE WITHOUT ACUTE INFLAMMATION 7. ?LEFT 4TH METATARSAL, CLEAN MARGIN: ? ? ? ? ? ? - ?VIABLE BONE WITH FATTY MARROW AND NO EVIDENCE OF OSTEOMYELITIS ? ? ?- VIABLE SOFT TISSUE WITHOUT ACUTE INFLAMMATION 8. ?LEFT 5TH METATARSAL, CLEAN MARGIN: ? ? ? ? ? ? - VIABLE SOFT TISSUE WITH PERIOSTEAL REACTIVE BONE FORMATION BUT NO MATURE BONE OR INFLAMMATION  9. ?LEFT FOOT, TRANSMETATARSAL AMPUTATION: ? ? ? ? ? ? - THIRD, FOURTH, AND FIFTH TOES WITH ARTICULAR CARTILAGE AND VIABLE BONE WITH FATTY MARROW, RARE ACUTE INFLAMMATORY CELL, BUT NO EVIDENCE OF OSTEOMYELITIS, AND VIABLE SOFT TISSUE WITH VERY FOCAL ACUTE INFLAMMATION ? ? ? - THREE OF FOUR SEPARATE SPECIMENS EXHIBITING ACUTE OSTEOMYELITIS ? ? ?- DORSAL AND PLANTER SURGICAL MARGINS EXHIBITING ERODED AND ULCERATED SKIN AND UNDERLYING ACUTE INFLAMMATION Assessment/Plan: 71 y.o. male with PMHx as noted s/p?left?foot transmetatarsal amputation, partially OPEN?(DOS: 10/15/2021), and now s/p ?left?foot metatarsal resections 1-5 with delayed primary closure, CLOSED?(DOS: 10/25/2021).  Patient doing well postoperatively?- Dressing left intact- Podiatric surgery will perform dressing changes as indicated- Nursing please reinforce dressing PRN with ABDs, 4x4 gauze, kerlix, ACE- At this time patient will be discharged pending finalize micro/path and ID recommendations.-Patient back to [place] upon discharge and will urgently follow up with his Infectious Disease PCP down there?Abx- vanc 10/14/2021-present- zosyn 10/14/2021- 10/29/21- fluconazole- 10/29/2021 to present?Consult- Medicine: for medical comanagement and pre-op risk stratification- VS: for perfusion assessment; perfusion deemed to be adequate vascular signed off- Infectious Disease: for antibiotic guidance and duration?Diet: CCActivity: NWB to LLE, WBAT to RLEDVT Ppx: lovenoxCode: Full?Dispo:?? pending finalized ID recs, no further podiatric surgical intervention planned during this admission.?Will discuss with attending, Dr. Lesle Reek page on-call podiatry resident with questions:?	SEE AMION FOR CALL SCHEDULEElectronically Signed by Budd Palmer, DPM, May 10, 2023Addendum 6:33 PM 10/30/21:Returned to patient room this pm with attending. Discussed further treatment plans with the patient to include starting an antibiotic as well as continuing with fluconazole for his peri-incisional erythema. Patient understands and agrees with plan. Dressing removed revealing peri-incisional erythema that is improved from earlier. Dressing applied consisting of betadine DSD. Plan otherwise as noted above. Seen with attending Dr. Matthias Hughs.Electronically Signed by Heidi Dach, DPM, Oct 30, 2021

## 2021-10-30 NOTE — Plan of Care
Problem: Adult Inpatient Plan of CareGoal: Plan of Care Review5/03/2022 0306 by Marquis Lunch, RNOutcome: Interventions implemented as appropriate5/03/2022 0306 by Marquis Lunch, RNOutcome: Interventions implemented as appropriate Plan of Care Overview/ Patient Status    Pt A&Ox4. VSS on RA, afebrile. On CC diet, LBM 10/30/2021. Urinating spontaneously to toilet. Continent of B&B. Up to toilet SBA with RW. Bilateral feet drsgs C/D/I, podiatry performs drsg changes. Pt NWB to LLE. T&R self independently in bed. No c/o pain this shift. IV abx administered per MAR. Call light in reach. Bed near nurse's station. Contact precautions maintained.Marquis Lunch, RN 3:12 AM 10/30/2021

## 2021-10-30 NOTE — Plan of Care
Problem: Adult Inpatient Plan of CareGoal: Readiness for Transition of CareOutcome: Interventions implemented as appropriate Plan of Care Overview/ Patient Status    Pt declining home care services, will return to [place].Case Management Plan  Flowsheet Row Most Recent Value Discharge Planning  Patient/Patient Representative goals/treatment preferences for discharge are:  Home Patient/Patient Representative was presented with a list of facilities, agencies and/or dme providers and Referral(s) placed for: None Mode of Transportation  Private car  (add comment for special considerations) CM D/C Readiness  PASRR completed and approved N/A Authorization number obtained, if required N/A Is there a 3 day INPATIENT Qualifying stay for Medicare Patients? N/A Medicare IM- signed, dated, timed and scanned, if required Yes DME Authorized/Delivered N/A No needs identified/ follow up with PCP/MD Yes Post acute care services secured W10 complete N/A Pri Completed and Accepted  N/A Is the destination address correct on the W10 N/A Finalized Plan  Expected Discharge Date 10/30/21 Discharge Disposition Home or Self Care  I identified my role as CM/TC from Care Management department.  Explained Medicare Important Message to patient over the phone. Patient stated understanding of the notice and was in agreement with the discharge plan. Patient advised of the right to call the [employer]) at [phone] if not in agreement with the discharge from the hospital. Copy mailed/routed to MyChart to patient after confirming address.?Juelle Dickmann B. Christin Fudge, RN, Mining engineer, SLA-2 & V2-NMHB/Cell: [phone]

## 2021-10-30 NOTE — Progress Notes
[facility]	Podiatry Progress Note5/9/2023Subjective: Patient seen at bedside this AM, NAD. NAEO.  Patient reports most of his pain is at night.  Patient states that pain was well controlled since yesterday. Discussed plans with patient moving forward to include awaiting finalized ID recs.  Patient verbalizes understanding and is in agreement.  Denies any pain or n/f/c/vomiting/sob/cp. S/p left foot metatarsal resections 1-5 with delayed primary closure, CLOSED (DOS: 10/25/2021).S/p left foot transmetatarsal amputation, partially OPEN (DOS: 10/15/2021).Objective: VitalsTemp:  [98.2 ?F (36.8 ?C)-99.1 ?F (37.3 ?C)] 99.1 ?F (37.3 ?C)Pulse:  [70-103] 70Resp:  [18-20] 20BP: (111-152)/(66-85) 127/76SpO2:  [94 %-99 %] 97 %Gen: Alert, no distressLeft foot:  Left foot warm and well-perfused.  TMA surgical incision well coapt with sutures intact.  No drainage noted from incision.  Mild erythema and edema around incision consistent with postop course.  No fluctuance, crepitus, proximal streaking, purulent drainage.Achilles surgical incisions well coapt with sutures intact.  No drainage noted from incision.  Mild erythema and edema around incision consistent with postop course.  No fluctuance, crepitus, proximal streaking, purulent drainage.Labs: Recent Labs   05/07/230818 05/07/230836 05/08/230559 05/08/230832 05/09/230503 WBC 8.1  --  8.5  --  8.3 HGB 9.9*  --  9.0*  --  9.6* HCT 32.10*  --  28.10*  --  29.90* CREATININE 1.10  --  1.00  --  0.90 BUN 14  --  15  --  13 CO2 22  --  21  --  21 PLT 321  --  276  --  292 GLU 114*   < > 131*   < > 138* K 4.4  --  4.0  --  4.0 NA 139  --  139  --  138 CL 107  --  107  --  106  < > = values in this interval not displayed. Imaging: XR Left Foot: 5/5/23FINDINGS:The patient is status post interval revision of the transmetatarsal amputation.Soft tissue swelling , skin staples and air are in keeping with postsurgical changes.Redemonstrated is a small metallic density at the plantar aspect of the calcaneus.IMPRESSION: Postsurgical changes, as above.XR Left Foot: 10/15/2021 FINDINGS:Patient is status post interval transmetatarsal amputation at the level of the mid shaft.Mild degenerative changes are noted in the included midfoot with small ossicles at the dorsal aspect of the talonavicular joint, in keeping with a sequel of prior injury.Soft tissue swelling and air is in keeping with postsurgical changes.IMPRESSION: Post surgical changes, as above.Micro/Path: Micro: 10/25/2021 - prelim1. L foot 1st MT CM: NGTD2. L foot 2nd MT CM: Growth from broth culture only Yeast 3. L foot 4th MT CM: NGTD4. L foot 5th MT CM: NGTD Path: 10/25/2021 - pending1. L foot 1st MT CM2. L foot 2nd MT CM3. L foot 4th MT CM4. L foot 5th MT CM5. L foot MT resectionMicro: 10/15/2021 -  final1. Left 2nd toe wound -  strep viridans2. Left 3rd toe bone -  strep viridans3. Left foot tissue - Pseudomonas aeruginosa, mixed GP/GN organism4. Left 1st metatarsal clean margin -  NGTD5. Left 2nd metatarsal clean margin - NGTD6. Left 3rd metatarsal clean margin - NGTD7. Left 4th metatarsal clean margin- SA8. Left 5th metatarsal clean margin- SA Path: 10/15/2021 - FINAL DIAGNOSIS 1.  LEFT 2ND TOE BONE, BIOPSY:        - ACUTE OSTEOMYELITIS 2.  LEFT 3RD TOE BONE, BIOPSY:             - ARTICULAR CARTILAGE AND VIABLE BONE WITH ACUTE OSTEOMYELITIS AND FEATURES OF OSTEOARTHRITIS      -  VIABLE SOFT TISSUE WITH ACUTE AND CHRONIC INFLAMMATION 3.  LEFT FOOT TISSUE, DEBRIDEMENT:             - BONE WITH ACUTE OSTEOMYELITIS AND OVERLYING ACUTELY INFLAMED SOFT TISSUE 4.  LEFT 1ST METATARSAL, CLEAN MARGIN:             - VIABLE BONE WITH FATTY MARROW AND AGGREGATES OF ACUTE INFLAMMATORY CELLS CONSISTENT WITH FOCAL ACUTE OSTEOMYELITIS 5.  LEFT 2ND METATARSAL, CLEAN MARGIN:             - VIABLE AND FOCALLY DEVITALIZED BONE WITH FATTY MARROW AND ACUTE INFLAMMATORY CELLS CONSISTENT WITH FOCAL ACUTE OSTEOMYELITIS 6.  LEFT 3RD METATARSAL CLEAN MARGIN:             - VIABLE BONE WITH FATTY MARROW AND SCATTERED ACUTE INFLAMMATORY CELLS BUT NO EVIDENCE OF OSTEOMYELITIS      - VIABLE SOFT TISSUE WITHOUT ACUTE INFLAMMATION 7.  LEFT 4TH METATARSAL, CLEAN MARGIN:             -  VIABLE BONE WITH FATTY MARROW AND NO EVIDENCE OF OSTEOMYELITIS      - VIABLE SOFT TISSUE WITHOUT ACUTE INFLAMMATION 8.  LEFT 5TH METATARSAL, CLEAN MARGIN:             - VIABLE SOFT TISSUE WITH PERIOSTEAL REACTIVE BONE FORMATION BUT NO MATURE BONE OR INFLAMMATION  9.  LEFT FOOT, TRANSMETATARSAL AMPUTATION:             - THIRD, FOURTH, AND FIFTH TOES WITH ARTICULAR CARTILAGE AND VIABLE BONE WITH FATTY MARROW, RARE ACUTE INFLAMMATORY CELL, BUT NO EVIDENCE OF OSTEOMYELITIS, AND VIABLE SOFT TISSUE WITH VERY FOCAL ACUTE INFLAMMATION       - THREE OF FOUR SEPARATE SPECIMENS EXHIBITING ACUTE OSTEOMYELITIS      - DORSAL AND PLANTER SURGICAL MARGINS EXHIBITING ERODED AND ULCERATED SKIN AND UNDERLYING ACUTE INFLAMMATION Assessment/Plan: 71 y.o. male with PMHx as noted s/p left foot transmetatarsal amputation, partially OPEN (DOS: 10/15/2021), and now s/p  left foot metatarsal resections 1-5 with delayed primary closure, CLOSED (DOS: 10/25/2021).  Patient doing well postoperatively- Dressings changed: betadine DSD- Podiatric surgery will perform dressing changes as indicated- Nursing please reinforce dressing PRN with ABDs, 4x4 gauze, kerlix, ACE- At this time patient will be discharged pending finalize micro/path and ID recommendations.-Patient back to [place] upon discharge and will urgently follow up with his Infectious Disease PCP down there Abx- vanc 10/14/2021-present- zosyn 10/14/2021-present Consult- Medicine: for medical comanagement and pre-op risk stratification- VS: for perfusion assessment; perfusion deemed to be adequate vascular signed off- Infectious Disease: for antibiotic guidance and duration Diet: CCActivity: NWB to LLE, WBAT to RLEDVT Ppx: lovenoxCode: Full Dispo:   pending finalized ID recs, no further podiatric surgical intervention planned during this admission.Will discuss with attending, Dr. Lesle Reek page on-call podiatry resident with questions:?	SEE [place] FOR CALL Angela Burke, DPM PGY-2 [facility] SurgeryElectronically Signed by Donne Hazel, DPM, May 9, 2023Addendum 8:23 PM 10/29/21:Returned to patient room this pm with attending. Discussed further treatment plans with the patient including awaiting finalized ID recs. Patient understands and agrees with plan. Dressing removed revealing exam as described above. Dressing applied consisting of betadine DSD. Plan otherwise as noted above. Seen with attending Dr. Matthias Hughs.Electronically Signed by Heidi Dach, DPM, Oct 29, 2021

## 2021-10-31 LAB — CBC WITH AUTO DIFFERENTIAL
BKR WAM ABSOLUTE IMMATURE GRANULOCYTES.: 0.01 x 1000/ÂµL (ref 0.00–0.30)
BKR WAM ABSOLUTE LYMPHOCYTE COUNT.: 1.68 x 1000/ÂµL (ref 0.60–3.70)
BKR WAM ABSOLUTE NRBC (2 DEC): 0 x 1000/ÂµL (ref 0.00–1.00)
BKR WAM ANALYZER ANC: 3.98 x 1000/ÂµL (ref 2.00–7.60)
BKR WAM BASOPHIL ABSOLUTE COUNT.: 0.08 x 1000/ÂµL (ref 0.00–1.00)
BKR WAM BASOPHILS: 1.2 % (ref 0.0–1.4)
BKR WAM EOSINOPHIL ABSOLUTE COUNT.: 0.13 x 1000/ÂµL (ref 0.00–1.00)
BKR WAM EOSINOPHILS: 2 % (ref 0.0–5.0)
BKR WAM HEMATOCRIT (2 DEC): 32.5 % — ABNORMAL LOW (ref 38.50–50.00)
BKR WAM HEMOGLOBIN: 10.5 g/dL — ABNORMAL LOW (ref 13.2–17.1)
BKR WAM IMMATURE GRANULOCYTES: 0.2 % (ref 0.0–1.0)
BKR WAM LYMPHOCYTES: 25.8 % (ref 17.0–50.0)
BKR WAM MCH (PG): 25.3 pg — ABNORMAL LOW (ref 27.0–33.0)
BKR WAM MCHC: 32.3 g/dL (ref 31.0–36.0)
BKR WAM MCV: 78.3 fL — ABNORMAL LOW (ref 80.0–100.0)
BKR WAM MONOCYTE ABSOLUTE COUNT.: 0.62 x 1000/ÂµL (ref 0.00–1.00)
BKR WAM MONOCYTES: 9.5 % (ref 4.0–12.0)
BKR WAM MPV: 8.5 fL (ref 8.0–12.0)
BKR WAM NEUTROPHILS: 61.3 % (ref 39.0–72.0)
BKR WAM NUCLEATED RED BLOOD CELLS: 0 % (ref 0.0–1.0)
BKR WAM PLATELETS: 331 x1000/ÂµL (ref 150–420)
BKR WAM RDW-CV: 14.9 % (ref 11.0–15.0)
BKR WAM RED BLOOD CELL COUNT.: 4.15 M/ÂµL (ref 4.00–6.00)
BKR WAM WHITE BLOOD CELL COUNT: 6.5 x1000/ÂµL (ref 4.0–11.0)

## 2021-10-31 MED ORDER — VANCOMYCIN MAR LEVEL
Freq: Once | INTRAVENOUS | Status: DC
Start: 2021-10-31 — End: 2021-10-31

## 2021-10-31 MED ORDER — METOPROLOL TARTRATE IMMEDIATE RELEASE 25 MG TABLET
25 mg | Freq: Two times a day (BID) | ORAL | Status: DC
Start: 2021-10-31 — End: 2021-11-02
  Administered 2021-11-01 (×2): 25 mg via ORAL

## 2021-10-31 MED ORDER — ESCITALOPRAM 10 MG TABLET
10 mg | Freq: Every morning | ORAL | Status: DC
Start: 2021-10-31 — End: 2021-10-31

## 2021-10-31 MED ORDER — CIPROFLOXACIN 500 MG TABLET
500 mg | Freq: Two times a day (BID) | ORAL | Status: DC
Start: 2021-10-31 — End: 2021-11-02
  Administered 2021-11-01 (×2): 500 mg via ORAL

## 2021-10-31 MED ORDER — CIPROFLOXACIN 500 MG TABLET
500 mg | Freq: Two times a day (BID) | ORAL | Status: DC
Start: 2021-10-31 — End: 2021-10-31

## 2021-10-31 MED ORDER — DOXYCYCLINE TAB/CAP 100 MG (WRAPPED E-RX)
100 mg | Freq: Two times a day (BID) | ORAL | Status: DC
Start: 2021-10-31 — End: 2021-11-02
  Administered 2021-11-01 (×2): 100 mg via ORAL

## 2021-10-31 MED ORDER — ESCITALOPRAM 10 MG TABLET
10 mg | Freq: Every morning | ORAL | Status: DC
Start: 2021-10-31 — End: 2021-11-02
  Administered 2021-10-31 – 2021-11-01 (×2): 10 mg via ORAL

## 2021-10-31 NOTE — Plan of Care
Plan of Care Overview/ Patient Status    Pt A/Ox4, makes needs known, cooperative w/care, Ax1 w/R NWB to LLE, Pt compliant.  Discussed POC and medication w/ Pt whom verbalized understanding. OOB to chair w/ BLE elevated. Left foot dressing changed by podiatry - c/d/I. No c/o pain. IV Vancomycin ADM per MAR.  Discussed POC and medication w/ Pt whom verbalized understanding.Tristan Schroeder, RN5/04/2022 1:47 PM

## 2021-10-31 NOTE — Progress Notes
[facility] Infectious Diseases Consult Progress Note____________________________________________________________Subjective Referring provider: Attending Provider: Ellison Carwin [phone] of admission: 4/24/2023Hospital Day: 17Today's date: 5/11/2023We were called back today because the team noticed redness at the stump. Patient had no pain at that area. No fever.AntimicrobialsVancomycin from 4/24Fluconazole from 5/9piperacillin-tazobactam from 4/24 to 5/11Past Medical History, Family History, Social History , Immunization, Allergies, Medications: have all been reviewed.________________________________________________________________________ Objective Physical ExaminationLast 24 hours: Temp:  [97.4 ?F (36.3 ?C)-98.9 ?F (37.2 ?C)] 98.4 ?F (36.9 ?C)Pulse:  [71-104] 89Resp:  [18-20] 18BP: (120-165)/(74-98) 120/82SpO2:  [97 %-99 %] 99 %02 sat and device used: SpO2: 99 %Device (Oxygen Therapy): room airPhysical ExamGeneral: older male, alert, not in acute distressMSK: left foot with redness and mild swelling around stump, staples in placeSkin: warm and dry, no rashesNeuro: awake, alert and oriented x 3. Laboratory examinationRecent Labs Lab 05/11/230741 WBC 6.5 HGB 10.5* HCT 32.50* PLT 331 MCV 78.3* NEUTROPHILS 61.3 LYMPHOCYTES 25.8 LYMPHABS 1.68 EOSINOPHILS 2.0 EOSABS 0.13 Recent Labs Lab 05/10/231514 NA 137 K 4.9 CL 102 CO2 21 BUN 15 CREATININE 1.00 GLU 153* No results for input(s): AST, ALT, ALKPHOS, BILITOT, ALBUMIN in the last 168 hours.Microbiology/VirologyDeep wound cultureLeft foot drainage 4/24: mixed aerobic gram positive and negative organisms, PsA and MRSALeft dorsal foot drainage 4/24: mixed aerobic gram positive and negative organisms, PsA ?OR culture 4/25:Left 4th metatarsal clean margin: 1 CFU MRSALeft 2nd toe bone: strep viridansleft 3rd metatarsal clean margin: NGleft 5th metatarsal clean margin: MRSAleft 3rd toe bone: S viridansleft foot tissue: PsAleft 2nd metatarsal clean margin: NGleft 1st metatarsal clean margin: NG?OR culture 5/5:left 2nd metatarsal clean margin: Candida albicansleft 5th metatarsal clean margin: NGTDleft 1st metatarsal clean margin: Candida albicansLeft 4th metatarsal clean margin: NGTDPath:?10/25/2021?- final1. ?LEFT 1ST METATARSAL CLEAN MARGIN #2: ? ? ? ? ? ? - REMODELING BONE AND FATTY MARROW, NEGATIVE FOR OSTEOMYELITIS 2. ?LEFT 2ND METATARSAL CLEAN MARGIN #2: ? ? ? ?- VIABLE CORTICLE BONE, NEGATIVE FOR OSTEOMYELITIS 3. ?LEFT 4TH METATARSAL CLEAN MARGIN #2: ? ? ? ? ? ? - VIABLE BONE AND FATTY MARROW, NEGATIVE FOR OSTEOMYELITIS 4. ?LEFT 5TH METATARSAL CLEAN MARGIN #2: ? ? ? ?- REMODELING CORTICLE BONE, NEGATIVE FOR OSTEOMYELITIS 5. ?LEFT FOOT METATARSAL RESECTION: ? ? ? ?- ACUTE OSTEOMYELITIS X1 ? ? ?- CHRONIC INACTIVE OSTEOMYELITIS X3 Line and devicesPIVImaging StudiesNo results found.________________________________________________________________________ Assessment:Edwin [name] is a 71 y.o., male with a PMH of DM c/b neuropathy, multiple podiatric amputations, CAD with PCI and stents, CABG 2022 whom we have been consulted for osteomyelitis?Patient with previous episode of osteomyelitis of bilateral feet with multiple amputation now presented with a wound on his left foot, imaging concern for osteomyelitis.  He underwent TMA on April 25th with pathology show osteomyelitis of the margins.  Culture grew mixed bacteria, including MRSA, Pseudomonas and strep viridans.  He had a revised TMA on May 5th with culture from clean margin now grew yeast, no other bacteria. However, pathology without evidence of OM at clean margins. ?Patient was started on fluconazole on 5/9. piperacillin-tazobactam was stoppedID was re-engaged today because the stump on left foot was red and swelling, concerning for cellulitis. On exam, stump was mildly swollen, with erythema but no tenderness, no drainage. Would consider ciprofloxacin and doxycycline for possible cellulitis. Would keep these 2 Abx until he sees his ID physician in [place] (scheduled for next Tuesday)Continue fluconazole for OM?________________________________________________________________________Recommendations:Diagnostics work up-monitor symptom and erythema at the stumpTreatment plan-can transition vancomycin to doxycycline-start ciprofloxacin -keep doxycycline and ciprofloxacin until patient is seen by ID in [place]-continue fluconazole, at least 6 monthsThank  you for this consult.  Patient discussed with Dr. Wynema Birch.  Addendum to follow.For questions or clarifications, please contact me via Mobile [employer]. Please contact the on-call ID fellow if after 5:00 pm.Angelika Jerrett B Cyndie Chime, MDInfectious Disease Fellow

## 2021-10-31 NOTE — Plan of Care
Inpatient Physical Therapy Progress Note IP Adult PT Eval/Treat - 10/31/21 1002    Date of Visit / Treatment  Date of Visit / Treatment 10/31/21   Note Type Daily Note   Start Time 935   End Time 1000   Total Treatment Time 25    General Information  Subjective Want me to demonstrate?   General Observations pt received seated in recliner,RA,NAD,alert and agreeable   Precautions/Limitations Fall Precautions   Precautions/Limitations Comment NWB LLE    Weight Bearing Status  Weight Bearing Status Comments NWB LLE    Vital Signs and Orthostatic Vital Signs  Vital Signs Free text RA, NAD    Pain/Comfort  Pain Comment (Pre/Post Treatment Pain) no c/o pain    Patient Coping  Observed Emotional State accepting    Cognition  Overall Cognitive Status WFL   Cognition Comments pleasant    Skin Assessment  Skin Assessment See Nursing Documentation    Balance  Sitting Balance: Static  GOOD-    Maintains static position against minimal resistance with no Assistive Device   Sitting Balance: Dynamic  FAIR+    Performs dynamic activities through full range with Supervision   Standing Balance: Static FAIR       Maintains static position without assist or device, may require Supervision or Verbal Cues (<2 minutes)   Standing Balance: Dynamic  FAIR-     Performs dynamic activities through partial range (50-75%) with Contact Guard   Balance Assist Device Rolling walker    Bed Mobility  Bed Mobility Comments pt receive and remained OOB    Sit-Stand Transfer Training  Sit-to-Stand Transfer Independence/Assistance Level Supervision   Sit-to-Stand Transfer Assist Device Rolling walker   Stand-to-Sit Transfer Independence/Assistance Level Supervision   Stand-to-Sit Transfer Assist Device Rolling walker   Sit-Stand Transfer Comments maintains NWB to LLE t/o    Facilities manager Transfer Independence/Assistance Level Level Supervision   Toilet Transfer Assist Device Rolling walker;Grab bars   Toilet Transfer Comments indep with hygiene    Gait Training  Independence/Assistance Level  Contact guard;Assist of 1   Assistive Device  Rolling walker   Gait Distance 20 feet;x2   Gait Training Comments maintains NWB to LLE t/o    Handoff Documentation  Handoff Patient in chair;Patient instructed to call nursing for mobility;Discussed with nursing    Endurance  Endurance Comments fair    PT- AM-PAC - Basic Mobility Screen- How much help from another person do you currently need.....  Turning from your back to your side while in a a flat bed without using rails? 4 - None - Does not require any help and does the activity independently. Can use assistive devices.   Moving from lying on your back to sitting on the side of a flat bed without using bed rails? 4 - None - Does not require any help and does the activity independently. Can use assistive devices.   Moving to and from a bed to a chair (including a wheelchair)? 3 - A Little - Requires a little help (supervision, minimal assistance). Can use assistive devices.   Standing up from a chair using your arms(e.g., wheelchair or bedside chair)? 3 - A Little - Requires a little help (supervision, minimal assistance). Can use assistive devices.   To walk in a hospital room? 3 - A Little - Requires a little help (supervision, minimal assistance). Can use assistive devices.   Climbing 3-5 steps with a railing? 2 - A Lot - Requires a lot of  help (maximum to moderate assistance). Can use assistive devices.   AMPAC Mobility Score 19   TARGET Highest Level of Mobility Mobility Level 6, Walk 10+steps   ACTUAL Highest Level of Mobility Mobility Level 6, Walk 10+ steps    Therapeutic Functional Activity  Therapeutic Functional Activity Comments discussed dispo planning, pt's son is here in [place] with him and traveling back to [place] together. Clinical Impression  Follow up Assessment Pt with good tolernace to PT session. Mobilizing at CGA/S level with RW, demos good adherence to NWB precautions. Continue to recommend d/c home when medically stable.   Criteria for Skilled Therapeutic Interventions Met yes;treatment indicated   Rehab Potential good, to achieve stated therapy goals    Patient/Family Stated Goals  Patient/Family Stated Goal(s) return home    Frequency/Equipment Recommendations  PT Frequency 3x per week   What day of week is next treatment expected? Friday   PT/PTA completing this assessment Joni Reining   Equipment Needs During Admission/Treatment Rolling walker    PT Recommendations for Inpatient Admission  Activity/Level of Assist ambulate;assist of 1;with rolling walker    Planned Treatment / Interventions  Education Treatment / Interventions Patient Education / Gaffer; goals   PT Discharge Summary  Physical Therapy Disposition Recommendation Home   Additional Physical Therapy Disposition Recommendations Home Physical Therapy;Home with family support   Equipment Recommendations for Discharge Rolling walker     Edwin [name], PTAMHB: [phone]/04/2022

## 2021-10-31 NOTE — Other
Inpatient Medicine - Consult NoteHospital day: 16Today: Pt seen at bedside this morning in NAD. Denies pain to the BLE. Reports joint pain and muscle aches to the upper extremities this morning. He recalls experiencing an episode of chest tightness during an argument a few months ago. He denies chest pain or tightness since this event. He states that he just wanted to bring it up prior to being discharged from the hospital. He denies nausea, vomiting, diarrhea, constipation, SOB, and chest pain. ObjectiveBP (!) 147/89  - Pulse 77  - Temp 98.3 ?F (36.8 ?C) (Oral)  - Resp 18  - Ht 5' 11 (1.803 m)  - Wt 89.1 kg  - SpO2 97%  - BMI 27.41 kg/m?  Exam:General: Pleasant man lying comfortably in bedCardiac: Regular rhythm and rate, normal S1 and S2 with no murmursPulmonary: Breathing comfortably on room air, CTABMsk: Unchanged, minimal pitting edema to the distal LLE. No erythematous streaking. No warmth. No calf tenderness bilaterally.  Neuro: Alert, conversant, responding to questions adequatelyYesterday's labs notable forCreatinine 1 --> 1.1 --> 1 --> 0.9Fasting glucose 131Hgb 11.7 --> 12.1 --> 9.8 --> 10.2 --> 9.9 --> 9WBC 9.5 --> 8.1 --> 8.5Plt 294 --> 321 --> 276Mg  1.8 --> 1.5 Micro 5/5 deep wound cultures - NGTD5/5 Left foot second metatarsal clean margin growing Candida albicans in broth culture only5/5 PathologyNegative for OM at clean marginImaging 5/5 foot X-ray: The patient is status post interval revision of the transmetatarsal amputation.Soft tissue swelling , skin staples and air are in keeping with postsurgical changes.Redemonstrated is a small metallic density at the plantar aspect of the calcaneus. Assessment & PlanMs. Cefalu is a 71 year old man with a history of  Diabetes, CAD s/p CABG in 2022 and remote stenting, who was admitted for left foot osteomyelitis s/p I&D and TMA, now status post delayed primary closure on 5/5. Wound cultures from initial surgery grew staph aureus, pseudomonas, and viridans. He continues to do well post-operatively and will continue fluconazole upon discharge.# left foot infection- antibiotics, pain, and surgical plan per podiatry- ID recommending discontinuing zosyn and continuing PO fluconazole 400mg  daily upon discharge. - Pathology negative for OM at clean margins - Repeat EKG yesterday with QTC of 490 and no acute changes. - Pt initially given lab holiday today given his stability, however per podiatry there is new increased erythema to the surgical site and new labs (CBC/BMP/CRP) will be drawn. - Rolling walker ordered. # type 2 diabetes - A1c 6.8 in 06/2021. Glucose well controlled off basal, with sliding scale use only thus far.- Given his blood glucose is well controlled at this time, recommend discontinuing sliding scale insulin and finger sticks at this time. - continue home regimen of metformin and dapagliflozin- neuropathic pain - continue gabapentin 300 mg TID# Creatinine elevation - mild increase in creatinine from 0.8 starting on 5/6. Normal PO intake, and urine output has not been documented. Differential includes pre-renal AKI from hypovolemia, pre-renal AKI from hypotension, variation of creatinine within baseline range.- Cr has shown improvement over the past few days. Will continue to monitor for any acute changes once labs finalize today.- encourage PO intake# Soft blood pressures - asymptomatic hypotension with spontaneous resolution over this past weekend. Periods of hypotension are not always accompanied by bradycardia, so unclear if dose reduction of metoprolol would be helpful. No evidence of hypotension secondary to infection.- BP and HR stable and well controlled at this time. Mildly hypertensive this morning prior to receiving dose of metoprolol.- continue metoprolol- can consider dose reduction  to 12.5 mg BID if persistent hypotension and bradycardia# CAD- continue aspirin 81 mg daily- continue metoprolol tartrate 25 mg BID- continue rosuvastatin 10 mg every 48 hours (limited in the setting of statin associated myalgias)# Vitamin B12 deficiency and microcytic anemia- continue cyanocobalamin 1000 mg daily- No bleeding from the left foot surgical site per podiatry. - No dark stools.Electronically Signed by Cleaster Corin, DPM, May 10, 2023Plans subject to change with attending's addendum.

## 2021-10-31 NOTE — Progress Notes
[facility]	Podiatry Progress Note5/11/2023Subjective: No events overnight. Patient seen with attending at bedside in NAD. Pt is in agreeable manner. Denies pain to the left LE.  Patient aware of plans to include continued monitoring of erythema to left foot and ID recs.  Denies f/n/v/cp/sob.S/p?left?foot metatarsal resections 1-5 with delayed primary closure, CLOSED?(DOS: 10/25/2021).S/p?left?foot transmetatarsal amputation, partially OPEN?(DOS: 10/15/2021).Objective: VitalsTemp:  [97.4 ?F (36.3 ?C)-98.9 ?F (37.2 ?C)] 97.8 ?F (36.6 ?C)Pulse:  [71-104] 74Resp:  [18-20] 18BP: (120-165)/(74-98) 147/87SpO2:  [97 %-99 %] 97 %Gen: Alert, no distressLeft foot: Dressing left intact.  Left foot:  Left foot warm and well-perfused.  TMA surgical incision well coapt with sutures intact.  No drainage noted from incision.  No edema.  Erythema around surgical incision improving from yesterday's exam.  No fluctuance, crepitus, proximal streaking, purulent drainage.?Achilles surgical incisions well coapt with sutures intact.  No drainage noted from incision.  No erythema or edema.  No fluctuance, crepitus, proximal streaking, purulent drainage.Labs: Recent Labs   05/09/230503 05/09/230826 05/10/231514 WBC 8.3  --   --  HGB 9.6*  --   --  HCT 29.90*  --   --  CREATININE 0.90  --  1.00 BUN 13  --  15 CO2 21  --  21 HSCRP  --   --  60.3* PLT 292  --   --  GLU 138*   < > 153* K 4.0  --  4.9 NA 138  --  137 CL 106  --  102  < > = values in this interval not displayed. Imaging: XR Left Foot: 5/5/23FINDINGS:The patient is status post interval revision of the transmetatarsal amputation.Soft tissue swelling , skin staples and air are in keeping with postsurgical changes.Redemonstrated is a small metallic density at the plantar aspect of the calcaneus.IMPRESSION: Postsurgical changes, as above.?XR?Left?Foot: 10/15/2021?FINDINGS:Patient is status post interval transmetatarsal amputation at the level of the mid shaft.Mild degenerative changes are noted in the included midfoot with small ossicles at the dorsal aspect of the talonavicular joint, in keeping with a sequel of prior injury.Soft tissue swelling and air is in keeping with postsurgical changes.IMPRESSION: Post surgical changes, as above.Micro/Path: Micro:?10/25/2021?- final1.?L foot 1st MT CM:  Yeast broth only2. L foot 2nd MT CM:  Candida albicans3. L foot 4th MT CM: NGTD4. L foot 5th MT CM: NGTD?Path:?10/25/2021?- final1. ?LEFT 1ST METATARSAL CLEAN MARGIN #2: ? ? ? ? ? ? - REMODELING BONE AND FATTY MARROW, NEGATIVE FOR OSTEOMYELITIS 2. ?LEFT 2ND METATARSAL CLEAN MARGIN #2: ? ? ? ?- VIABLE CORTICLE BONE, NEGATIVE FOR OSTEOMYELITIS 3. ?LEFT 4TH METATARSAL CLEAN MARGIN #2: ? ? ? ? ? ? - VIABLE BONE AND FATTY MARROW, NEGATIVE FOR OSTEOMYELITIS 4. ?LEFT 5TH METATARSAL CLEAN MARGIN #2: ? ? ? ?- REMODELING CORTICLE BONE, NEGATIVE FOR OSTEOMYELITIS 5. ?LEFT FOOT METATARSAL RESECTION: ? ? ? ?- ACUTE OSTEOMYELITIS X1 ? ? ?- CHRONIC INACTIVE OSTEOMYELITIS X3 ??Micro:?10/15/2021?-? final1.?Left?2nd toe wound -  strep viridans2.?Left 3rd toe bone -  strep viridans3.?Left foot tissue - Pseudomonas aeruginosa, mixed GP/GN organism4.?Left 1st metatarsal clean margin -  NGTD5.?Left 2nd metatarsal clean margin - NGTD6.?Left?3rd?metatarsal clean margin - NGTD7.?Left 4th metatarsal clean margin- SA8.?Left 5th metatarsal clean margin- SA?Path:?10/15/2021?- FINAL DIAGNOSIS 1. ?LEFT 2ND TOE BONE, BIOPSY: ? ? ? ?- ACUTE OSTEOMYELITIS 2. ?LEFT 3RD TOE BONE, BIOPSY: ? ? ? ? ? ? - ARTICULAR CARTILAGE AND VIABLE BONE WITH ACUTE OSTEOMYELITIS AND FEATURES OF OSTEOARTHRITIS ? ? ?- VIABLE SOFT TISSUE WITH ACUTE AND CHRONIC INFLAMMATION 3. ?LEFT FOOT TISSUE, DEBRIDEMENT: ? ? ? ? ? ? -  BONE WITH ACUTE OSTEOMYELITIS AND OVERLYING ACUTELY INFLAMED SOFT TISSUE 4. ?LEFT 1ST METATARSAL, CLEAN MARGIN: ? ? ? ? ? ? - VIABLE BONE WITH FATTY MARROW AND AGGREGATES OF ACUTE INFLAMMATORY CELLS CONSISTENT WITH FOCAL ACUTE OSTEOMYELITIS 5. ?LEFT 2ND METATARSAL, CLEAN MARGIN: ? ? ? ? ? ? - VIABLE AND FOCALLY DEVITALIZED BONE WITH FATTY MARROW AND ACUTE INFLAMMATORY CELLS CONSISTENT WITH FOCAL ACUTE OSTEOMYELITIS 6. ?LEFT 3RD METATARSAL CLEAN MARGIN: ? ? ? ? ? ? - VIABLE BONE WITH FATTY MARROW AND SCATTERED ACUTE INFLAMMATORY CELLS BUT NO EVIDENCE OF OSTEOMYELITIS ? ? ?- VIABLE SOFT TISSUE WITHOUT ACUTE INFLAMMATION 7. ?LEFT 4TH METATARSAL, CLEAN MARGIN: ? ? ? ? ? ? - ?VIABLE BONE WITH FATTY MARROW AND NO EVIDENCE OF OSTEOMYELITIS ? ? ?- VIABLE SOFT TISSUE WITHOUT ACUTE INFLAMMATION 8. ?LEFT 5TH METATARSAL, CLEAN MARGIN: ? ? ? ? ? ? - VIABLE SOFT TISSUE WITH PERIOSTEAL REACTIVE BONE FORMATION BUT NO MATURE BONE OR INFLAMMATION  9. ?LEFT FOOT, TRANSMETATARSAL AMPUTATION: ? ? ? ? ? ? - THIRD, FOURTH, AND FIFTH TOES WITH ARTICULAR CARTILAGE AND VIABLE BONE WITH FATTY MARROW, RARE ACUTE INFLAMMATORY CELL, BUT NO EVIDENCE OF OSTEOMYELITIS, AND VIABLE SOFT TISSUE WITH VERY FOCAL ACUTE INFLAMMATION ? ? ? - THREE OF FOUR SEPARATE SPECIMENS EXHIBITING ACUTE OSTEOMYELITIS ? ? ?- DORSAL AND PLANTER SURGICAL MARGINS EXHIBITING ERODED AND ULCERATED SKIN AND UNDERLYING ACUTE INFLAMMATION Assessment/Plan: 71 y.o. male with PMHx as noted s/p?left?foot transmetatarsal amputation, partially OPEN?(DOS: 10/15/2021), and now s/p ?left?foot metatarsal resections 1-5 with delayed primary closure, CLOSED?(DOS: 10/25/2021).  Erythema around surgical incision continuing to improve. ?- Dressing changed: betadine DSD- Podiatric surgery will perform dressing changes as indicated- Nursing please reinforce dressing PRN with ABDs, 4x4 gauze, kerlix, ACE- At this time patient will be discharged pending finalized ID recommendations and once erythema resolves- Patient back to [place] upon discharge and will urgently follow up with his Infectious Disease PCP down there?Abx- vanc 10/14/2021-10/01/21- zosyn 10/14/2021- 10/29/21- fluconazole- 10/29/2021 to present- doxycycline-10/31/2021 to present- ciprofloxacin-10/31/2021 to present?Consult- Medicine: for medical comanagement and pre-op risk stratification- VS: for perfusion assessment; perfusion deemed to be adequate vascular signed off- Infectious Disease:  Do fluconazole 400 mg daily for 6 months perform MRSA decolonization, instructions given to the patient by Infectious Disease team.  Can transition vancomycin to doxycycline/ciprofloxacin, keep doxy/cipro until patient is seen by ID in [place].?Diet: CCActivity: NWB to LLE, WBAT to RLEDVT Ppx: lovenoxCode: Full?Dispo:?? pending patient clinical improvement, no further podiatric surgical intervention planned during this admission.?Seen with attending, Dr. Lesle Reek page on-call podiatry resident with questions:?	SEE AMION FOR CALL SCHEDULEElectronically Signed by Heidi Dach, DPM, Oct 31, 2021

## 2021-10-31 NOTE — Plan of Care
Plan of Care Overview/ Patient Status    PT A/Ox4, pleasant and cooperative with care. Pt denies complaints of pain at this time. Vitals stable. BP (!) 141/90  - Pulse 82  - Temp 98.3 ?F (36.8 ?C) (Oral)  - Resp 18  - Ht 5' 11 (1.803 m)  - Wt 89.1 kg  - SpO2 99%  - BMI 27.41 kg/m? Marland Kitchen Pt denies chest pain or SOB. LUngs clear, pt tolerating room air. PIV's x2 intact and patent. IV vanco as ordered. Left foot gauze dressing D/I, no drainage noted, changed by team. +numnbess/tingling noted to BLE. +voiding, amb ax1 with RW to bathroom. NWB LLE. Plan for discharge today if wound continues to improve. Juliane Poot, RN5/11/20232:48 AMProblem: Adult Inpatient Plan of CareGoal: Plan of Care ReviewOutcome: Interventions implemented as appropriateFlowsheets (Taken 10/31/2021 0242)Progress: improvingPlan of Care Reviewed With: patient

## 2021-10-31 NOTE — Other
[facility]	Vascular Consult Note Patient Data:  Patient Name: Edwin [name] Age: 71 y.o. 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 Wound began in March, presented to his primary podiatrist for care and debridement performed. Since then appearance has been worsening and he was instructed to present to hospital. Scheduled today for podiatry intervention. He does not follow with vascular surgeon, but has had angiogram with AT angioplasty in 2021 in [place]. He reports that he has had sonograms since then that he has been told were WNL. He is ambulatory, retired but takes jobs that require travel. Denies foot pain d/t neuropathy. Review of Allergies/Medical History/Medications: I have reviewed the patient's allergies, prior to admission meds, past medical/surgical, family and social hx. Allergies has No Known Allergies.Past Medical History  has no past medical history on file.Past Surgical History  has no past surgical history on file.Family History family history is not on file.Social History  has no history on file for tobacco use, alcohol use, and drug use.Prior to Admission Medications No medications prior to admission. Inpatient Medications:I have reviewed the patient's current medication orders.Current Facility-Administered Medications Medication Dose Route Frequency Provider Last Rate Last Admin ? [Held by provider] enoxaparin (LOVENOX) injection 30 mg  30 mg Subcutaneous Q12H Fasihuddin, Rafi M., DPM   30 mg at 10/14/21 2229 ? escitalopram oxalate (LEXAPRO) tablet 10 mg  10 mg Oral Daily Baruch Goldmann, MD   10 mg at 10/15/21 1023 ? gabapentin (NEURONTIN) capsule 300 mg  300 mg Oral TID Baruch Goldmann, MD   300 mg at 10/15/21 1022 ? [Held by provider] insulin lispro (Admelog, HumaLOG) Sliding Scale (See admin instructions for dose) 1-18 Units  1-18 Units Subcutaneous TID AC Fasihuddin, Rafi M., DPM     ? [Held by provider] insulin lispro (Admelog, HumaLOG) Sliding Scale (See admin instructions for dose) 1-18 Units  1-18 Units Subcutaneous Nightly Fasihuddin, Rafi M., DPM     ? insulin regular human (HumuLIN R, NovoLIN R) Sliding Scale (See admin instructions for dose) 1-18 Units  1-18 Units Subcutaneous Q6H Fasihuddin, Rafi M., DPM     ? LORazepam (ATIVAN) tablet 0.5 mg  0.5 mg Oral Once Fasihuddin, Rafi M., DPM     ? metoprolol tartrate (LOPRESSOR) Immediate Release tablet 25 mg  25 mg Oral BID Baruch Goldmann, MD   25 mg at 10/15/21 1022 ? piperacillin-tazobactam (ZOSYN) 4.5 g in sodium chloride 0.9% 100 mL (mini-bag plus)  4.5 g Intravenous Q6H Wadie Lessen P., DPM 33.3 mL/hr at 10/15/21 1026 4.5 g at 10/15/21 1026 ? polyethylene glycol (MIRALAX) packet 17 g  17 g Oral Daily Fasihuddin, Rafi M., DPM     ? sodium chloride 0.9 % flush 3 mL  3 mL IV Push Q8H Fasihuddin, Rafi M., DPM   3 mL at 10/15/21 0622 ? vancomycin (VANCOCIN) 1.25 g in sodium chloride 0.9% 250 mL IVPB  1.25 g Intravenous Q12H Manus Rudd., DPM 166.7 mL/hr at 10/15/21 0255 1.25 g at 10/15/21 0255 ? vancomycin (VANCOCIN) 1.5 gram injection           Review of Systems: Review of Systems  Constitutional: Negative.  HENT: Negative.  Respiratory: Negative.  Cardiovascular: Negative.  Gastrointestinal: Negative.  Skin: Positive for wound. Physical Exam: Vitals:I have reviewed the patient's current vital signs as documented in the patient's EMR.  Last 24 hours: Temp:  [98 ?F (36.7 ?C)-99.6 ?F (37.6 ?C)] 98.1 ?F (36.7 ?C)Pulse:  [76-99] 99Resp:  [18-20] 20BP: (108-136)/(67-86) 125/72SpO2:  [94 %-100 %] 98 %Intake/Output:I have reviewed the patient's current I&O's as documented in the EMR. and No intake or output data in the 24 hours ending 10/15/21 1034Physical ExamConstitutional:     General: He is awake.    Appearance: He is well-developed and well-groomed. HENT:    Head: Normocephalic and atraumatic. Cardiovascular:    Rate and Rhythm: Normal rate and regular rhythm.    Pulses:         Radial pulses are 2+ on the right side and 2+ on the left side.      Femoral pulses are 2+ on the right side and 2+ on the left side.     Popliteal pulses are 2+ on the right side and 2+ on the left side.      Dorsalis pedis pulses are 2+ on the right side.    Comments: RLE PT monophasic signalLLE DP/PT biphasic signalPulmonary:    Effort: Pulmonary effort is normal. Feet:    Right foot:    Skin integrity: Ulcer present.    Left foot:    Skin integrity: Ulcer present.    Comments: R foot healing ulcerL foot wound with erythema, swelling, drainageMultiple toe ampsNeurological:    Mental Status: He is alert. Psychiatric:       Behavior: Behavior is cooperative. Review of Labs/Diagnostics: Lab Review:I have reviewed the patient's labs within the last 24 hrs.Complete Blood CountResults in Past 7 DaysResult Component Current Result Hematocrit 35.40 (L) (10/15/2021) Hemoglobin 11.1 (L) (10/15/2021) MCH 24.7 (L) (10/15/2021) MCHC 31.4 (10/15/2021) MCV 78.8 (L) (10/15/2021) MPV 8.2 (10/15/2021) Platelets 296 (10/15/2021) RBC 4.49 (10/15/2021) WBC 8.3 (10/15/2021)   Chemistry  Lab Results Component Value Date  NA 139 10/14/2021  K 4.4 10/14/2021  CL 107 10/14/2021  CO2 20 10/14/2021  BUN 19 10/14/2021  CREATININE 1.00 10/14/2021  GLU 117 (H) 10/15/2021  Lab Results Component Value Date  CALCIUM 9.4 10/14/2021  Diagnostic Review:XR FOOT RIGHT AP LATERAL AND OBLIQUE?INDICATION: right foot wound.?COMPARISON: None?FINDINGS: Patient is 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 No obvious evidence of aggressive or erosive osseous process at the surgical margins. Extensive degenerative/subchondral cystic changes are noted at the midfoot. Cortical erosions appear present within the navicular bone and likely the medial cuneiform along the naviculocuneiform joint. Small amount of subcutaneous gas and soft tissue swelling is noted at the surgical stump. Calcaneal enthesophyte.  Vascular calcifications are noted.?IMPRESSION: ?There appears to be an aggressive/erosive process within the midfoot at the naviculocuneiform joint which is suspicious for osteomyelitis. Further evaluation with MRI should be considered if there is clinical concern for osteomyelitis.?Status post transmetatarsal amputation of the second through fifth digits without obvious evidence of erosive changes at the surgical margin. ?Report Initiated By:  Bertram Denver, MD?Reported And Signed By: Lennart Pall, MD  [facility] Radiology and Biomedical ImagingXR FOOT LEFT AP LATERAL AND OBLIQUE?INDICATION: pain, wound, osteo?Marland Kitchen?COMPARISON: None ?FINDINGS: Patient is status post amputation at the level of the first MTP joint and transmetatarsal amputation of the fifth metatarsal shaft. Subcutaneous gas is noted along the surgical margin of the first digit as well as the second MTP joint and the interspace between the  second and third proximal phalanges. Erosions are seen at the plantar aspect of the first metatarsal head as well as a hallux sesamoid bone. There is an ill-defined appearance of the surgical margin of the fifth metatarsal with small amount of surrounding periostitis. There is diffuse soft tissue swelling of the forefoot. ?Vascular calcifications are noted.?IMPRESSION: Postsurgical changes with findings concerning for osteomyelitis of the first metatarsal head, hallux sesamoid bone and possibly at the resection margin of the fifth metatarsal is well. Further evaluation with MRI should be considered.?[facility] Notify System Classification: Orange - Urgent with Follow-up?Report Initiated By:  Bertram Denver, MD?Reported And Signed By: Vivianne Spence, MD  [facility] Radiology and Biomedical ImagingXR CHEST PA AND LATERAL  ?HISTORY: pre op. ?COMPARISON: None?FINDINGS/IMPRESSION:Cardiac silhouette is enlarged. Status post CABG.?Mild interstitial septal thickening, likely represents edema and/or infection. No large pleural effusion or pneumothorax. ?Reported And Signed By: Madie Reno, MD  [facility] Radiology and Biomedical ImagingANKLE BRACHIAL INDICES BILAT AND CW/PVR MULTILEVELS?COMPARISON: None.?INDICATION: Nonpalpable pulses.  ?TECHNIQUE: Pressures were obtained, ABIs were calculated and PVR waveforms were obtained in the thigh, calf, ankle, metatarsal and great toe regions of both lower extremities.?FINDINGS: The pressures in mmHg are as noted below. The thigh, calf, ankle and toe brachial indices are in parentheses.?RIGHT:?Brachial- 115?Proximal thigh- 159,(1.22)   Calf- 185,(1.42)Dorsalis pedis- 157, (1.21)Posterior tibial- 108, (0.83)Toe- 127, (0.98)?LEFT:?Brachial- 130?Proximal thigh- 154,(1.18) Calf- 165, (1.27) Dorsalis pedis- 142, (1.09)Posterior tibial- 156, (1.20)Toe- status post amputation?Significant pressure gradients are noted on the right between the calf and dorsalis pedis artery as well as the calf and posterior tibial artery. 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 If clinically indicated, further evaluation with a CTA runoff may be considered.?Report Initiated By:  Pablo Hudson, MD?Reported And Signed By: Kathrin Greathouse, MD  [facility] Radiology and Biomedical Imaging US DUPLEX LOWER EXTREMITY ARTERIES BILATERAL?INDICATION: nonpalpable pulses.?COMPARISON: None available.?FINDINGS: ?Real-time duplex on the examination of the lower extremity arterial systems was performed bilaterally from the levels of the common iliac arteries to the ankles.?Right lower extremity peak systolic velocities (cm/s):?External iliac artery:  97Common femoral artery:  57Profunda femoral artery:  38Proximal superficial femoral artery:  superficial femoral artery:  61Distal superficial femoral artery:  74Popliteal artery:  42Proximal anterior tibial artery:  anterior tibial artery:  69Distal anterior tibial artery:  107Proximal peroneal artery:  peroneal artery: 32Distal peroneal artery: 32Proximal posterior tibial artery:  posterior tibial artery:  51Distal posterior tibial artery:  No flow seen.Dorsalis pedis artery:  9.8?Grayscale and color Doppler imaging of the right lower extremity demonstrates calcified plaque within the proximal right external iliac artery.?No Doppler detectable flow seen in the right distal posterior tibial artery, which may be related to occlusion versus very slow flow. Remainder of the right calf arteries and the right dorsalis pedis are patent.?Left lower extremity peak systolic velocities (cm/s):?External iliac artery:  138Common femoral artery:  107Profunda femoral artery:  60Proximal superficial femoral artery:  superficial femoral artery:  96Distal superficial femoral artery:  96Popliteal artery:  107Proximal anterior tibial artery:  anterior tibial artery:  11Distal anterior tibial artery:  33Proximal peroneal artery:  peroneal artery:  156Distal peroneal artery:  165Proximal posterior tibial artery:  posterior tibial artery:  No flow seen.Distal posterior tibial artery:  31, retrograde flow.Dorsalis pedis artery:  24, low resistance waveform?Grayscale and color Doppler imaging of the left lower extremity demonstrates calcified plaque at the proximal left external iliac artery.?IMPRESSION:?Patent bilateral femoropopliteal arteries without significant stenotic disease.?No Doppler detectable flow seen in the right distal posterior tibial artery, which may be related to occlusion versus very slow flow. Remainder of the right calf arteries and the right dorsalis pedis are patent.?Dampened flow in the proximal/mid left mid posterior tibial artery with retrograde flow seen in its distal portion.?Decreased velocities within the dorsalis pedis arteries bilaterally, and spectral broadening/tardus parvus waveforms on the left, presumably related to proximal atherosclerotic stenotic disease in the calf arteries. ?If further imaging is clinically desired, CTA or MRA may be of use for further evaluation.?Report Initiated By:  Tenna Delaine, MD?Reported And Signed By: Dion Saucier, MD  [facility] Radiology and Biomedical ImagingMRI FOOT LEFT W WO IV CONTRAST?Clinical Indication: Foot swelling, diabetic, osteomyelitis suspected, xray done. ?Technique: Multiplanar multisequential MR imaging of the left forefoot and midfoot was obtained with 18 cc of Dotarem intravenous contrast.?Comparison: Correlation was made to left foot radiographs 10/14/2021?Findings:?The patient is status post transmetatarsal amputations of the first and fifth rays to the levels of the first metatarsal head and fifth mid to distal metatarsal shaft. There are soft tissue irregularities at the dorsal and plantar aspects of the second and third metatarsal heads and second and third metatarsophalangeal joints, which likely represent known wounds. There is marked dorsal subluxations of the second and third metatarsophalangeal joints. There is marrow replacement, marrow edema and enhancement centered at the second and third metatarsophalangeal joints involving the second and third metatarsals and second and third proximal phalanges, compatible with osteomyelitis and septic arthritis. There is an ill-defined fluid collection/abscess that extends from the dorsal subcutaneous tissues at the level of the second and third metatarsophalangeal joints and second and third metatarsal heads that extends through the joints and extends to the plantar subcutaneous tissues, which is difficult to accurately measure although measures approximately 5.1 x 2.5 x 4 cm (AP x TR x CC dimension). There is also suggestion of nonviable/necrotic tissue adjacent to the second and third metatarsophalangeal joints and second and third metatarsal heads. ?There is marrow replacement, marrow edema and enhancement of the first metatarsal sparing the base, which is concerning for osteomyelitis. There are phlegmonous/inflammatory changes with enhancement of the soft tissues surrounding the first metatarsal. There are small foci of susceptibility artifact adjacent to the first metatarsal, likely from subcutaneous gas. ?There is marrow replacement, marrow edema and enhancement of the fifth metatarsal, which is nonspecific although could represent osteomyelitis. 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 There is diffuse fatty atrophy and edema of the intrinsic muscles of the imaged foot, which could be seen in diabetic myopathy. There is diffuse subcutaneous edema and enhancement of the dorsal and plantar soft tissues of the forefoot.?Impression:?Dorsal and plantar wounds centered at the second and third metatarsophalangeal joints and second and third metatarsal heads. Osteomyelitis and septic arthritis centered at the second and third metatarsophalangeal joints involving the second and third metatarsals and second and third proximal phalanges. Marked dorsal subluxations of the second and third metatarsophalangeal joints. Large, ill-defined fluid collection/abscess that extends from the dorsal subcutaneous tissues through the second and third metatarsophalangeal joints and extends to the plantar subcutaneous tissues with surrounding nonviable/necrotic tissue. ?Osteomyelitis of the first metatarsal with surrounding phlegmonous/inflammatory changes and subcutaneous gas. ?Findings are also concerning for osteomyelitis involving the fifth metatarsal.?Reported And Signed By: Ahmed Prima, MD  [facility] Radiology and  Biomedical ImagingECG/Tele Events: Results for orders placed or performed during the hospital encounter of 10/14/21 EKG  Collection Time: 10/14/21  7:34 PM Result Value Ref Range  Heart Rate 91 bpm  QRS Interval 135 ms  QT Interval 402 ms  QTC Interval 495 ms  P Axis 76 deg  QRS Axis -65 deg  T Wave Axis 106 deg  P-R Interval 219 msec  SEVERITY Abnormal ECG severity Impression: 71 yo male from [place] with pmh CAD s/p remote PCI with x3 stent placement, CABG Jan 2022, controlled DM 6.1%, 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 ABI and art duplex available for review. I have reviewed the patient's problem list and updated it as needed.Recommendations: -Imaging reviewed with Dr. Dimple Casey, [facility] for podiatry to proceed to OR today for planned intervention. Please evaluate intraoperative bleeding. -Will continue to follow and discuss need for angiogram based on healing and intraoperative findings-Wound care per pods-Final plan per addendumPatient was evaluated: In personElectronically Signed: Romie Levee, APRN For questions please page: [facility] red pager [phone]/25/2023 10:27 AM

## 2021-11-01 DIAGNOSIS — Z7982 Long term (current) use of aspirin: Secondary | ICD-10-CM

## 2021-11-01 DIAGNOSIS — F419 Anxiety disorder, unspecified: Secondary | ICD-10-CM

## 2021-11-01 DIAGNOSIS — M009 Pyogenic arthritis, unspecified: Secondary | ICD-10-CM

## 2021-11-01 DIAGNOSIS — Z951 Presence of aortocoronary bypass graft: Secondary | ICD-10-CM

## 2021-11-01 DIAGNOSIS — Z79899 Other long term (current) drug therapy: Secondary | ICD-10-CM

## 2021-11-01 DIAGNOSIS — Z8614 Personal history of Methicillin resistant Staphylococcus aureus infection: Secondary | ICD-10-CM

## 2021-11-01 DIAGNOSIS — Z20822 Contact with and (suspected) exposure to covid-19: Secondary | ICD-10-CM

## 2021-11-01 DIAGNOSIS — E11621 Type 2 diabetes mellitus with foot ulcer: Secondary | ICD-10-CM

## 2021-11-01 DIAGNOSIS — F32A Depression, unspecified: Secondary | ICD-10-CM

## 2021-11-01 DIAGNOSIS — E785 Hyperlipidemia, unspecified: Secondary | ICD-10-CM

## 2021-11-01 DIAGNOSIS — E1169 Type 2 diabetes mellitus with other specified complication: Secondary | ICD-10-CM

## 2021-11-01 DIAGNOSIS — M86672 Other chronic osteomyelitis, left ankle and foot: Secondary | ICD-10-CM

## 2021-11-01 DIAGNOSIS — M19072 Primary osteoarthritis, left ankle and foot: Secondary | ICD-10-CM

## 2021-11-01 DIAGNOSIS — Z7984 Long term (current) use of oral hypoglycemic drugs: Secondary | ICD-10-CM

## 2021-11-01 DIAGNOSIS — I10 Essential (primary) hypertension: Secondary | ICD-10-CM

## 2021-11-01 DIAGNOSIS — D509 Iron deficiency anemia, unspecified: Secondary | ICD-10-CM

## 2021-11-01 DIAGNOSIS — L97529 Non-pressure chronic ulcer of other part of left foot with unspecified severity: Secondary | ICD-10-CM

## 2021-11-01 DIAGNOSIS — Z955 Presence of coronary angioplasty implant and graft: Secondary | ICD-10-CM

## 2021-11-01 DIAGNOSIS — I2510 Atherosclerotic heart disease of native coronary artery without angina pectoris: Secondary | ICD-10-CM

## 2021-11-01 DIAGNOSIS — E1152 Type 2 diabetes mellitus with diabetic peripheral angiopathy with gangrene: Secondary | ICD-10-CM

## 2021-11-01 DIAGNOSIS — E538 Deficiency of other specified B group vitamins: Secondary | ICD-10-CM

## 2021-11-01 DIAGNOSIS — E1141 Type 2 diabetes mellitus with diabetic mononeuropathy: Secondary | ICD-10-CM

## 2021-11-01 LAB — CBC WITH AUTO DIFFERENTIAL
BKR WAM ABSOLUTE IMMATURE GRANULOCYTES.: 0.04 x 1000/ÂµL (ref 0.00–0.30)
BKR WAM ABSOLUTE LYMPHOCYTE COUNT.: 1.63 x 1000/ÂµL (ref 0.60–3.70)
BKR WAM ABSOLUTE NRBC (2 DEC): 0 x 1000/ÂµL (ref 0.00–1.00)
BKR WAM ANALYZER ANC: 3.79 x 1000/ÂµL (ref 2.00–7.60)
BKR WAM BASOPHIL ABSOLUTE COUNT.: 0.08 x 1000/ÂµL (ref 0.00–1.00)
BKR WAM BASOPHILS: 1.3 % (ref 0.0–1.4)
BKR WAM EOSINOPHIL ABSOLUTE COUNT.: 0.18 x 1000/ÂµL (ref 0.00–1.00)
BKR WAM EOSINOPHILS: 2.8 % (ref 0.0–5.0)
BKR WAM HEMATOCRIT (2 DEC): 35.4 % — ABNORMAL LOW (ref 38.50–50.00)
BKR WAM HEMOGLOBIN: 11.2 g/dL — ABNORMAL LOW (ref 13.2–17.1)
BKR WAM IMMATURE GRANULOCYTES: 0.6 % (ref 0.0–1.0)
BKR WAM LYMPHOCYTES: 25.7 % (ref 17.0–50.0)
BKR WAM MCH (PG): 25.5 pg — ABNORMAL LOW (ref 27.0–33.0)
BKR WAM MCHC: 31.6 g/dL (ref 31.0–36.0)
BKR WAM MCV: 80.5 fL (ref 80.0–100.0)
BKR WAM MONOCYTE ABSOLUTE COUNT.: 0.63 x 1000/ÂµL (ref 0.00–1.00)
BKR WAM MONOCYTES: 9.9 % (ref 4.0–12.0)
BKR WAM MPV: 8.9 fL (ref 8.0–12.0)
BKR WAM NEUTROPHILS: 59.7 % (ref 39.0–72.0)
BKR WAM NUCLEATED RED BLOOD CELLS: 0 % (ref 0.0–1.0)
BKR WAM PLATELETS: 348 x1000/ÂµL (ref 150–420)
BKR WAM RDW-CV: 15 % (ref 11.0–15.0)
BKR WAM RED BLOOD CELL COUNT.: 4.4 M/ÂµL (ref 4.00–6.00)
BKR WAM WHITE BLOOD CELL COUNT: 6.4 x1000/ÂµL (ref 4.0–11.0)

## 2021-11-01 LAB — VANCOMYCIN, TROUGH: BKR VANCOMYCIN TROUGH: 13.9 ug/mL (ref 10.0–15.0)

## 2021-11-01 MED ORDER — FLUCONAZOLE 200 MG TABLET
200 mg | ORAL_TABLET | Freq: Every day | ORAL | 1 refills | 3.00 days | Status: AC
Start: 2021-11-01 — End: 2021-11-01
  Filled 2021-11-01: qty 62, 31d supply, fill #0

## 2021-11-01 MED ORDER — ESCITALOPRAM 10 MG TABLET
10 mg | ORAL_TABLET | Freq: Every morning | ORAL | 1 refills | 30.00 days | Status: AC
Start: 2021-11-01 — End: ?
  Filled 2021-11-01: qty 30, 30d supply, fill #0
  Filled 2021-11-01: qty 30, 30d supply, fill #1

## 2021-11-01 MED ORDER — CYANOCOBALAMIN (VIT B-12) 1,000 MCG TABLET
1000 MCG | ORAL_TABLET | Freq: Every day | ORAL | 12 refills | 30.00 days | Status: AC
Start: 2021-11-01 — End: ?
  Filled 2021-11-01: qty 30, 30d supply, fill #1
  Filled 2021-11-01: qty 30, 30d supply, fill #0

## 2021-11-01 MED ORDER — OXYCODONE IMMEDIATE RELEASE 5 MG TABLET
5 mg | ORAL_TABLET | Freq: Three times a day (TID) | ORAL | 1 refills | 3.00 days | Status: AC | PRN
Start: 2021-11-01 — End: ?
  Filled 2021-11-01: qty 9, 3d supply, fill #1
  Filled 2021-11-01: qty 9, 3d supply, fill #0

## 2021-11-01 MED ORDER — DOXYCYCLINE HYCLATE 100 MG CAPSULE
100 mg | ORAL_CAPSULE | Freq: Two times a day (BID) | ORAL | 1 refills | 10.00 days | Status: AC
Start: 2021-11-01 — End: ?
  Filled 2021-11-01: qty 20, 10d supply, fill #0
  Filled 2021-11-01: qty 20, 10d supply, fill #1

## 2021-11-01 MED ORDER — ROSUVASTATIN 10 MG TABLET
10 mg | ORAL_TABLET | ORAL | 1 refills | 60.00 days | Status: AC
Start: 2021-11-01 — End: ?
  Filled 2021-11-01: qty 30, 60d supply, fill #0
  Filled 2021-11-01: qty 30, 60d supply, fill #1

## 2021-11-01 MED ORDER — CIPROFLOXACIN 500 MG TABLET
500 mg | ORAL_TABLET | Freq: Two times a day (BID) | ORAL | 1 refills | 10.00 days | Status: AC
Start: 2021-11-01 — End: ?
  Filled 2021-11-01: qty 20, 10d supply, fill #0
  Filled 2021-11-01: qty 20, 10d supply, fill #1

## 2021-11-01 MED ORDER — FLUCONAZOLE 200 MG TABLET
200 mg | ORAL_TABLET | Freq: Every day | ORAL | 1 refills | 34.00 days | Status: AC
Start: 2021-11-01 — End: ?

## 2021-11-01 NOTE — Other
[place] HOSPITALOPERATIVE REPORTCONFIDENTIAL - DO NOT COPY WITHOUT APPROPRIATE AUTHORIZATIONName:  BENTLY, WYSS CService:  SLA2Unit No:  [id number] CSN:  [id number] Service Area:  PodDate of Birth:  [birth year]Date of Adm:  04/24/2023Dictated:  10/16/2021 06:20:54          Dictated by:  Verne Carrow, D.P.M.Transcribed:  10/16/2021 06:48:59       [id number] DATE OF PROCEDURE/SURGERY:  04/25/2023OPERATION:Left foot transmetatarsal amputation.OPERATOR:Lennyx Verdell Zoila Shutter, DPMASSISTANT:Rafi Fasihuddin, D.P.M.ANESTHESIA:MAC with local anesthetic.PREOP DIAGNOSIS:Left foot necrotizing skin and soft tissue infection.Left foot osteomyelitisPOSTOP DIAGNOSIS:Left foot necrotizing skin and soft tissue infection.Left foot osteomyelitisESTIMATED BLOOD LOSS:150 cc.SPECIMENS REMOVED:Pathology: 1. Left 2nd toe bone. 2. Left 3rd toe bone. 3. Left foot tissue. 4. Left 1st metatarsal clean margin. 5. Left 2nd metatarsal clean margin. 6. Left 3rd metatarsal clean margin. 7. Left 4th metatarsal clean margin. 8. Left 5th metatarsal clean margin. 9. Left TMA. Microbiology: 1. Left 2nd toe bone. 2. Left 3rd toe bone. 3. Left foot tissue. 4. Left 1st metatarsal clean margin. 5. Left 2nd metatarsal clean margin. 6. Left 3rd metatarsal clean margin. 7. Left 4th metatarsal clean margin. 8. Left 5th metatarsal clean margin.HEMOSTASIS:  Bovie electrocautery, Esmarch ankle tourniquet.INJECTABLES:  30 cc of 0.5% Marcaine plain.MATERIALS:  2-0 nylon suture.FINDINGS:  Left foot with full-thickness wounds dorsal and plantar with liquefactive necrosis present in wound.  2 cc purulence expressed from dissection or planned incision.  Bone stock distal to the amputation site soft, bone soft proximal to amputation site hard.  Bleeding intraoperatively was acceptable.  Upon partial wound closure, flaps noted to have capillary refill time within normal limits.PROCEDURE:  The patient was brought into the operating room and placed on the operating table in the supine position.  Venodyne boot was placed on the patient's contralateral calf.  A time-out was called per [facility] guidelines and patient identification and proper procedure and laterality were verified.  All bony prominences were well padded.  Following IV sedation, 30 cc of 0.5% Marcaine plain was administered to the left ankle for regional anesthesia. The patient had received IV Vancomycin and Zosyn pre-operatively. The left foot was then scrubbed, prepped, and draped in usual sterile manner.  Attention was then directed to the left foot.  There was noted to be a dorsal and plantar wound around the 2nd and 3rd metatarsal head that was noted to connect.  There was liquefactive tissue and bone present in the wound.  A full-thickness circumferential incision was made approximately 2 cm proximal to the metatarsophalangeal joint.  The incision was deepened to the level of bone, ligating or cauterizing all vessels as necessary.  Periosteal incisions were then made transversely across the metatarsals, and soft tissue was freed from the area.  At this time, Esmarch was applied and the foot was exsanguinated to allow for better surgical field visualization. The Esmarch was used as ankle tourniquet. An osteotomy was then created through each metatarsal bone approximately 2 cm proximal to its head.  The transection was carried out to ensure an appropriate metatarsal parabola.  The resected bone, the distal toes were removed and passed from the operative field.  The remaining soft tissues including any tendons were resected.  All remaining tissue was inspected for any necrosis or infection.  The surgical wound was then copiously lavaged with a Pulsavac system, then placed on a sterile towel.  All surgeons involved changed into sterile gloves. The wound was inspected for any bleeders which were cauterized.  Clean margin specimens were obtained from the metatarsals 1 through 5 and sent for  culture and pathology.  The wound was then reapproximated from the medial and lateral aspect with 2-0 nylon suture in a simple interrupted fashion.  The central aspect of the surgical incision was left open and packed with saline soaked gauze.  The foot was then dressed with 4 x 4 fluffs, ABD pad, Kerlix wrap, and an Ace bandage.  The patient tolerated the procedure and anesthesia well.  The patient was transferred to the recovery room with vital signs stable and vascular status intact to the distal aspect of the left TMA site.  After a period of postoperative monitoring, the patient will be transferred back to the floor.  Dr. Paulene Floor, DPM, was scrubbed and present for all critical portions of the case.

## 2021-11-01 NOTE — Other
Inpatient Medicine - Consult NoteHospital day: 17Today: Pt seen at bedside this morning in NAD.  Pt states he got into a verbal altercation with his roommate yesterday because he suspected he was smoking in the bathroom. He reports getting so angry that he had a short episode of discomfort in his chest. He reports significant frustration over the situation, but is happy his roommate has been removed. He booked a flight back to [place] on Saturday, but states he will cancel it if he is not ready for discharge by then. He currently denies chest pain, SOB, nausea, vomiting, diarrhea, and constipation.  ObjectiveBP (!) 147/87  - Pulse 74  - Temp 97.8 ?F (36.6 ?C) (Oral)  - Resp 18  - Ht 5' 11 (1.803 m)  - Wt 89.1 kg  - SpO2 97%  - BMI 27.41 kg/m?  Exam:General: Older man seen sitting upright in bedside chair, NADCardiac: Regular rhythm and rate, normal S1 and S2 with no murmursPulmonary: Breathing comfortably on room air, CTABMsk: No appreciable pitting edema to the bilateral lower extremities. Left foot bandage intact with no strike-through.  Neuro: Alert, conversant, responding to questions adequatelyYesterday's labs notable forCreatinine 1.1 --> 1 --> 0.9 --> 1 (yesterday)Lab glucose 153Hgb 9.9 --> 9 --> 10.5WBC 9.5 --> 8.1 --> 8.5 --> 6.5Plt 294 --> 321 --> 276 --> 331Mg  1.8 --> 1.5 Micro 5/5 deep wound cultures - NGTD5/5 Left foot second metatarsal clean margin growing Candida albicans in broth culture only5/5 PathologyNegative for OM at clean marginImaging 5/5 foot X-ray: The patient is status post interval revision of the transmetatarsal amputation.Soft tissue swelling , skin staples and air are in keeping with postsurgical changes.Redemonstrated is a small metallic density at the plantar aspect of the calcaneus. Assessment & PlanMs. 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 Wound cultures from initial surgery grew staph aureus, pseudomonas, and viridans. Cultures from most recent surgery growing Candida albicans in broth culture. ID recommending 6 months fluconazole upon discharge. Discharge delayed given concern for increasing erythema to surgical site. Pt continued on vancomycin (10/30/2021) with continuation of fluconazole.  # left foot infection- antibiotics, pain, and surgical plan per podiatry- Pathology negative for OM at clean margins  - ID recommending 6 months fluconazole 400mg  upon discharge- Discharge delayed given new concern for increasing erythema to surgical site. Plan for vancomycin with fluconazole for now. - Pt afebrile, WBC 6.5 today with CRP of 60.3 yesterday.- When patient prepared for discharge, can consider oxy 5mg  q8h for 3 days for breakthrough pain# type 2 diabetes - A1c 6.8 in 06/2021. Glucose well controlled off basal, with sliding scale use only thus far.- Given his blood glucose is well controlled at this time, recommend discontinuing sliding scale insulin and finger sticks at this time. - continue home regimen of metformin and dapagliflozin- neuropathic pain - continue gabapentin 300 mg TID# Creatinine elevation - mild increase in creatinine from 0.8 starting on 5/6. Normal PO intake, and urine output has not been documented. Differential includes pre-renal AKI from hypovolemia, pre-renal AKI from hypotension, variation of creatinine within baseline range.- Cr stable at 1 yesterday, will continue to monitor since vancomycin was restarted yesterday evening- encourage PO intake# Soft blood pressures - asymptomatic hypotension with spontaneous resolution from 5/6 - 5/7. Periods of hypotension are not always accompanied by bradycardia, so unclear if dose  reduction of metoprolol would be helpful. No evidence of hypotension secondary to infection. Will continue to monitor, although patient's blood pressure has been stable with occasional episodes of mild hypertension. - continue metoprolol- can consider dose reduction to 12.5 mg BID hypotension and bradycardia return# CAD- continue aspirin 81 mg daily- continue metoprolol tartrate 25 mg BID- continue rosuvastatin 10 mg every 48 hours (limited in the setting of statin associated myalgias)# Vitamin B12 deficiency and microcytic anemia- continue cyanocobalamin 1000 mg daily- Hgb and Hct improved today- No bleeding from the left foot surgical site per podiatry. - No dark stools.Electronically Signed by Cleaster Corin, DPM, May 11, 2023Plans subject to change, please await attendings addendum for final recommendations.

## 2021-11-01 NOTE — Discharge Instructions
[facility]     Podiatric Surgery & MedicineDischarge Instructions:Non-weight bearing status to left foot Dressings can stay intact until follow up visit in [place], however if would like dressing changes, please dress left foot with betadine over incision sites, 4x4 gauze, kerlix roll, ACE bandageElevate left lower extremity on two pillows Take all medications as prescribed Follow up as directed with your podiatrist in [place] and infectious disease doctor (Dr. Hanley Hays) in [place] your doctor if you have nausea, vomiting, temperature>101, increased bleeding/drainage, uncontrolled pain or increased redness/warmth

## 2021-11-01 NOTE — Other
Edwin [name] was discharged via [employer] accompanied by [employer].  Verbalized understanding of discharge instructionsand recommended follow up care as per the after visit summary.  Written discharge instructions provided. Denies any further questions. Vital signs    Vitals:  11/01/21 0022 11/01/21 0455 11/01/21 0748 11/01/21 1217 BP: 139/83 123/81 130/82 121/74 Pulse: 82 75 72 76 Resp: 18 20 18 18  Temp: 97.8 ?F (36.6 ?C) 97.9 ?F (36.6 ?C) 97.8 ?F (36.6 ?C) 98.1 ?F (36.7 ?C) TempSrc: Oral Oral Oral Oral SpO2: 97% 98% 100% 99% Weight:     Height:     Patient confirmed all belongings returned. Belongings charted in last 7 days: Valuable(s) : None (10/25/2021  6:53 AM)  Tristan Schroeder, RN5/05/2022 3:53 PM

## 2021-11-01 NOTE — Plan of Care
Plan of Care Overview/ Patient Status    PT A/Ox4, pleasant and cooperative with care. Pt denies complaints of pain at this time. Vitals stable. BP 139/83  - Pulse 82  - Temp 97.8 ?F (36.6 ?C) (Oral)  - Resp 18  - Ht 5' 11 (1.803 m)  - Wt 89.1 kg  - SpO2 97%  - BMI 27.41 kg/m? Marland Kitchen Pt denies chest pain or SOB. Lungs clear, pt tolerating room air. PIV's x2 intact and patent. PO antibiotics as ordered. Left foot gauze dressing D/I, no drainage noted, changed by podiatry team. Right plantar site scabbed over, dry aquacel AG placed per pt request with DSD. +numnbess/tingling noted to BLE. +voiding, amb ax1 with RW to bathroom. +BM. NWB LLE. Plan for discharge within 24-28h. Pt T&P self.Problem: Adult Inpatient Plan of CareGoal: Plan of Care ReviewOutcome: Interventions implemented as appropriateFlowsheets (Taken 11/01/2021 0330)Progress: improvingPlan of Care Reviewed With: patient

## 2021-11-01 NOTE — Progress Notes
[facility]	Podiatry Progress Note5/12/2023Subjective: No events overnight. Patient seen at bedside in NAD. Pt is in agreeable manner. Denies pain to the left LE.  Patient aware of plans to include continued monitoring of erythema to left foot and ID recs.  Denies f/n/v/cp/sob.S/p left foot metatarsal resections 1-5 with delayed primary closure, CLOSED (DOS: 10/25/2021).S/p left foot transmetatarsal amputation, partially OPEN (DOS: 10/15/2021).Objective: VitalsTemp:  [97.8 ?F (36.6 ?C)-98.4 ?F (36.9 ?C)] 97.9 ?F (36.6 ?C)Pulse:  [74-95] 75Resp:  [18-20] 20BP: (111-147)/(75-87) 123/81SpO2:  [97 %-99 %] 98 %Gen: Alert, no distressLeft foot: Dressing left intact.  Left foot:  Left foot warm and well-perfused.  TMA surgical incision well coapt with sutures intact.  No drainage noted from incision.  No edema.  Erythema around surgical incision improving from yesterday's exam, only noted anteriorly/dorsally not noted plantarly.  No fluctuance, crepitus, proximal streaking, purulent drainage. Achilles surgical incisions well coapt with sutures intact.  No drainage noted from incision.  No erythema or edema.  No fluctuance, crepitus, proximal streaking, purulent drainage.Labs: Recent Labs   05/10/231514 05/11/230741 WBC  --  6.5 HGB  --  10.5* HCT  --  32.50* CREATININE 1.00  --  BUN 15  --  CO2 21  --  HSCRP 60.3*  --  PLT  --  331 GLU 153*  --  K 4.9  --  NA 137  --  CL 102  --  Imaging: XR Left Foot: 5/5/23FINDINGS:The patient is status post interval revision of the transmetatarsal amputation.Soft tissue swelling , skin staples and air are in keeping with postsurgical changes.Redemonstrated is a small metallic density at the plantar aspect of the calcaneus.IMPRESSION: Postsurgical changes, as above. XR Left Foot: 10/15/2021 FINDINGS:Patient is status post interval transmetatarsal amputation at the level of the mid shaft.Mild degenerative changes are noted in the included midfoot with small ossicles at the dorsal aspect of the talonavicular joint, in keeping with a sequel of prior injury.Soft tissue swelling and air is in keeping with postsurgical changes.IMPRESSION: Post surgical changes, as above.Micro/Path: Micro: 10/25/2021 - final1. L foot 1st MT CM:  Yeast broth only2. L foot 2nd MT CM:  Candida albicans3. L foot 4th MT CM: NGTD4. L foot 5th MT CM: NGTD Path: 10/25/2021 - final1.  LEFT 1ST METATARSAL CLEAN MARGIN #2:             - REMODELING BONE AND FATTY MARROW, NEGATIVE FOR OSTEOMYELITIS 2.  LEFT 2ND METATARSAL CLEAN MARGIN #2:        - VIABLE CORTICLE BONE, NEGATIVE FOR OSTEOMYELITIS 3.  LEFT 4TH METATARSAL CLEAN MARGIN #2:             - VIABLE BONE AND FATTY MARROW, NEGATIVE FOR OSTEOMYELITIS 4.  LEFT 5TH METATARSAL CLEAN MARGIN #2:        - REMODELING CORTICLE BONE, NEGATIVE FOR OSTEOMYELITIS 5.  LEFT FOOT METATARSAL RESECTION:        - ACUTE OSTEOMYELITIS X1      - CHRONIC INACTIVE OSTEOMYELITIS X3   Micro: 10/15/2021 -  final1. Left 2nd toe wound -  strep viridans2. Left 3rd toe bone -  strep viridans3. Left foot tissue - Pseudomonas aeruginosa, mixed GP/GN organism4. Left 1st metatarsal clean margin -  NGTD5. Left 2nd metatarsal clean margin - NGTD6. Left 3rd metatarsal clean margin - NGTD7. Left 4th metatarsal clean margin- SA8. Left 5th metatarsal clean margin- SA Path: 10/15/2021 - FINAL DIAGNOSIS 1.  LEFT 2ND TOE BONE, BIOPSY:        - ACUTE OSTEOMYELITIS  2.  LEFT 3RD TOE BONE, BIOPSY:             - ARTICULAR CARTILAGE AND VIABLE BONE WITH ACUTE OSTEOMYELITIS AND FEATURES OF OSTEOARTHRITIS      - VIABLE SOFT TISSUE WITH ACUTE AND CHRONIC INFLAMMATION 3.  LEFT FOOT TISSUE, DEBRIDEMENT:             - BONE WITH ACUTE OSTEOMYELITIS AND OVERLYING ACUTELY INFLAMED SOFT TISSUE 4.  LEFT 1ST METATARSAL, CLEAN MARGIN:             - VIABLE BONE WITH FATTY MARROW AND AGGREGATES OF ACUTE INFLAMMATORY CELLS CONSISTENT WITH FOCAL ACUTE OSTEOMYELITIS 5.  LEFT 2ND METATARSAL, CLEAN MARGIN:             - VIABLE AND FOCALLY DEVITALIZED BONE WITH FATTY MARROW AND ACUTE INFLAMMATORY CELLS CONSISTENT WITH FOCAL ACUTE OSTEOMYELITIS 6.  LEFT 3RD METATARSAL CLEAN MARGIN:             - VIABLE BONE WITH FATTY MARROW AND SCATTERED ACUTE INFLAMMATORY CELLS BUT NO EVIDENCE OF OSTEOMYELITIS      - VIABLE SOFT TISSUE WITHOUT ACUTE INFLAMMATION 7.  LEFT 4TH METATARSAL, CLEAN MARGIN:             -  VIABLE BONE WITH FATTY MARROW AND NO EVIDENCE OF OSTEOMYELITIS      - VIABLE SOFT TISSUE WITHOUT ACUTE INFLAMMATION 8.  LEFT 5TH METATARSAL, CLEAN MARGIN:             - VIABLE SOFT TISSUE WITH PERIOSTEAL REACTIVE BONE FORMATION BUT NO MATURE BONE OR INFLAMMATION  9.  LEFT FOOT, TRANSMETATARSAL AMPUTATION:             - THIRD, FOURTH, AND FIFTH TOES WITH ARTICULAR CARTILAGE AND VIABLE BONE WITH FATTY MARROW, RARE ACUTE INFLAMMATORY CELL, BUT NO EVIDENCE OF OSTEOMYELITIS, AND VIABLE SOFT TISSUE WITH VERY FOCAL ACUTE INFLAMMATION       - THREE OF FOUR SEPARATE SPECIMENS EXHIBITING ACUTE OSTEOMYELITIS      - DORSAL AND PLANTER SURGICAL MARGINS EXHIBITING ERODED AND ULCERATED SKIN AND UNDERLYING ACUTE INFLAMMATION Assessment/Plan: 71 y.o. male with PMHx as noted s/p left foot transmetatarsal amputation, partially OPEN (DOS: 10/15/2021), and now s/p  left foot metatarsal resections 1-5 with delayed primary closure, CLOSED (DOS: 10/25/2021).  Erythema around surgical incision continuing to improve.  - Dressing changed: betadine DSD- Podiatric surgery will perform dressing changes as indicated- Nursing please reinforce dressing PRN with ABDs, 4x4 gauze, kerlix, ACE- At this time patient will most likely be discharged today, erythema is improving on antibiotics.-Patient's sutures and staples stay intact until his team down in [place] feels confident that the skin has epithelialized and Cam be removed- Patient back to [place] upon discharge and will urgently follow up with his Infectious Disease PCP down there.  He reports he has appointment this coming Tuesday- Id [facility] all [facility] so he has some them for his trip home Abx- vanc 10/14/2021-10/01/21- zosyn 10/14/2021- 10/29/21- fluconazole- 10/29/2021 to present- doxycycline-10/31/2021 to present- ciprofloxacin-10/31/2021 to present Consult- Medicine: for medical comanagement and pre-op risk stratification- VS: for perfusion assessment; perfusion deemed to be adequate vascular signed off- Infectious Disease:  Do fluconazole 400 mg daily for 6 months perform MRSA decolonization, instructions given to the patient by Infectious Disease team.  Can transition vancomycin to doxycycline/ciprofloxacin, keep doxy/cipro until patient is seen by ID in [place]. Diet: CCActivity: NWB to LLE, WBAT to RLEDVT Ppx: lovenoxCode: Full Dispo:   pending  patient clinical improvement, no further podiatric surgical intervention planned during this admission. Will discuss with attending, Dr. Lesle Reek page on-call podiatry resident with questions:SEE AMION FOR CALL Angela Burke, DPM PGY-2 [facility] Podiatric SurgeryElectronically Signed by Donne Hazel, DPM, May 12, 2023Discussed with resident, agree with plan. Electronically Signed by Paulene Floor, DPM, Nov 01, 2021

## 2021-11-01 NOTE — Plan of Care
Inpatient Physical Therapy Progress Note IP Adult PT Eval/Treat - 11/01/21 1210    Date of Visit / Treatment  Date of Visit / Treatment 11/01/21   Note Type Progress Note   End Time 1210   Total Treatment Time 25    General Information  Subjective I don't want to leave here with you guys thinking I can't walk   General Observations Pt received seated OOB in recliner, agreeable to treatment. RN cleared to mobilize.   Precautions/Limitations Fall Precautions   Precautions/Limitations Comment NWB LLE    Weight Bearing Status  Weight Bearing Status Comments NWB LLE    Vital Signs and Orthostatic Vital Signs  Vital Signs Vital Signs Stable    Pain/Comfort  Pain Comment (Pre/Post Treatment Pain) no c/o pain    Patient Coping  Observed Emotional State accepting    Cognition  Overall Cognitive Status WFL    Skin Assessment  Skin Assessment See Nursing Documentation    Balance  Sitting Balance: Static  GOOD     Maintains static position against moderate resistance with no Assistive Device   Sitting Balance: Dynamic  GOOD-   Independent in dynamic balance activities (ambulates on level surfaces with no Assistive Device, picks up object from floor in sitting)   Standing Balance: Static FAIR+     Maintains static position without assist or device, may require Supervision or Verbal Cues (>2 minutes)   Standing Balance: Dynamic  FAIR+    Performs dynamic activities through full range with Supervision   Balance Assist Device Rolling walker   Balance Skills Training Comment Ax1    Bed Mobility  Bed Mobility Comments received and remained OOB    Sit-Stand Transfer Training  Sit-to-Stand Transfer Independence/Assistance Level Supervision   Sit-to-Stand Transfer Assist Device Rolling walker   Stand-to-Sit Transfer Independence/Assistance Level Supervision   Stand-to-Sit Transfer Assist Device Rolling walker   Sit-Stand Transfer Comments NWB LLE maintained    Toilet Transfer Training  Toilet Transfer Independence/Assistance Level Level Supervision   Toilet Transfer Assist Device Rolling walker    Gait Training  Independence/Assistance Level  Supervision   Assistive Device  Rolling walker   Gait Distance 20 feet;x2   Gait Analysis Impairments impaired balance   Gait Training Comments safe with maintaining NWB LLE    Handoff Documentation  Handoff Patient in chair;Pressure relief cushion;Patient instructed to call nursing for mobility;Discussed with nursing    Endurance  Endurance Comments fair    PT- AM-PAC - Basic Mobility Screen- How much help from another person do you currently need.....  Turning from your back to your side while in a a flat bed without using rails? 4 - None - Does not require any help and does the activity independently. Can use assistive devices.   Moving from lying on your back to sitting on the side of a flat bed without using bed rails? 4 - None - Does not require any help and does the activity independently. Can use assistive devices.   Moving to and from a bed to a chair (including a wheelchair)? 3 - A Little - Requires a little help (supervision, minimal assistance). Can use assistive devices.   Standing up from a chair using your arms(e.g., wheelchair or bedside chair)? 3 - A Little - Requires a little help (supervision, minimal assistance). Can use assistive devices.   To walk in a hospital room? 3 - A Little - Requires a little help (supervision, minimal assistance). Can use assistive devices.   Climbing 3-5 steps  with a railing? 2 - A Lot - Requires a lot of help (maximum to moderate assistance). Can use assistive devices.   AMPAC Mobility Score 19   Comments 19   TARGET Highest Level of Mobility Mobility Level 6, Walk 10+steps   ACTUAL Highest Level of Mobility Mobility Level 7, Walk 25+ feet    Therapeutic Functional Activity  Therapeutic Functional Activity Comments all questions answered as able; plans to d/c to [place] today. recommend obtaining knee scooter upon returning home    Clinical Impression  Follow up Assessment Pt participating in skilled intervention as able. Demonstrates ambulation and transfers with supervision and RW NWB LLE. Recommend d/c home with family support and home PT when medically stable.   Criteria for Skilled Therapeutic Interventions Met yes;treatment indicated   Rehab Potential good, to achieve stated therapy goals    Patient/Family Stated Goals  Patient/Family Stated Goal(s) return home    Frequency/Equipment Recommendations  PT Frequency 3x per week   What day of week is next treatment expected? Monday   PT/PTA completing this assessment Maryan Rued    PT Recommendations for Inpatient Admission  Activity/Level of Assist ambulate;supervision   Other/Comments NWB LLE    Planned Treatment / Interventions  Education Treatment / Interventions Patient Education / Training   safety/goals   PT Discharge Summary  Physical Therapy Disposition Recommendation Home   Additional Physical Therapy Disposition Recommendations Home Physical Therapy   Equipment Recommendations for Discharge Rolling walker   knee scooter    Fabio Asa, PTA Level II5/12/2023MHB: [phone]

## 2021-11-01 NOTE — Other
Inpatient Medicine - Consult NoteHospital day: 18Today: Pt seen at bedside this morning in NAD. Pt aware of plans for discharge today. He states that he has follow up with his infectious disease doctor on Tuesday and an appointment with his cardiologist in early June. He mentions that he is feeling much better today. He currently denies chest pain, SOB, nausea, vomiting, diarrhea, and constipation.  ObjectiveBP 130/82  - Pulse 72  - Temp 97.8 ?F (36.6 ?C) (Oral)  - Resp 18  - Ht 5' 11 (1.803 m)  - Wt 89.1 kg  - SpO2 100%  - BMI 27.41 kg/m?  Exam:General: Older man seen sitting upright in bedside chair, NADCardiac: Regular rhythm and rate, normal S1 and S2 with no murmursPulmonary: Breathing comfortably on room air, CTABMsk: No appreciable pitting edema to the bilateral lower extremities. Left foot bandage intact with no strike-through.  Neuro: Alert, conversant, responding to questions adequatelyYesterday's labs notable forCreatinine 1.1 --> 1 --> 0.9 --> 1 (yesterday)Lab glucose 153Hgb 9.9 --> 9 --> 10.5 --> 11.2WBC 8.1 --> 8.5 --> 6.5 --> 6.4Micro 5/5 deep wound cultures - NGTD5/5 Left foot second metatarsal clean margin growing Candida albicans in broth culture only5/5 PathologyNegative for OM at clean marginImaging 5/5 foot X-ray: The patient is status post interval revision of the transmetatarsal amputation.Soft tissue swelling , skin staples and air are in keeping with postsurgical changes.Redemonstrated is a small metallic density at the plantar aspect of the calcaneus. Assessment & PlanMs. 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 Wound cultures from initial surgery grew staph aureus, pseudomonas, and viridans. Cultures from most recent surgery growing Candida albicans in broth culture. ID recommending 6 months fluconazole upon discharge. Discharge initially delayed given increased erythema to the surgical site. ID re-evaluated and recommended continuation of fluconazole with the addition of cipro/doxy. # left foot infection- antibiotics, pain, and surgical plan per podiatry- Pathology negative for OM at clean margins  - ID recommending 6 months fluconazole 400mg  with the addition of cipro/doxy until follow up with ID in [place].- Pt afebrile, WBC 6.4 today with improved erythema to surgical site per podiatry note. - 3 days Oxy 5mg  q8h on discharge for break through pain# type 2 diabetes - A1c 6.8 in 06/2021. Glucose well controlled off basal, with sliding scale use only thus far.- Given his blood glucose is well controlled at this time, sliding scale and finger sticks discontinued. - Continue home regimen of metformin and dapagliflozin- Neuropathic pain - continue gabapentin 300 mg TID# Creatinine elevation - mild increase in creatinine from 0.8 starting on 5/6. Normal PO intake, and urine output has not been documented. Differential included pre-renal AKI from hypovolemia, pre-renal AKI from hypotension, variation of creatinine within baseline range.- Cr stable at 1 yesterday, patient with adequate PO intake. - can continue to monitor for any acute changes outpatient, although Cr has remained relatively stable for most of his admission. # Soft blood pressures - asymptomatic hypotension with spontaneous resolution from 5/6 - 5/7. Periods of hypotension are not always accompanied by bradycardia, so unclear if dose reduction of metoprolol would be helpful. No evidence of hypotension secondary to infection.- Pressures have remained stable throughout his admission since his brief episode of hypotension - continue metoprolol- can consider dose reduction to 12.5 mg BID hypotension and bradycardia return at discretion of his PCP#  CAD- continue aspirin 81 mg daily- continue metoprolol tartrate 25 mg BID- continue rosuvastatin 10 mg every 48 hours (limited in the setting of statin associated myalgias)# Vitamin B12 deficiency and microcytic anemia- continue cyanocobalamin 1000 mg daily- Hgb and Hct improvedElectronically Signed by Cleaster Corin, DPM, May 12, 2023Plans subject to change, please await attendings addendum for final recommendations.

## 2021-11-01 NOTE — Plan of Care
Problem: Adult Inpatient Plan of CareGoal: Readiness for Transition of CareOutcome: Interventions implemented as appropriate Plan of Care Overview/ Patient Status    Pt and spouse decline home care services, are returning to [place].Case Management Plan  Flowsheet Row Most Recent Value Discharge Planning  Patient/Patient Representative goals/treatment preferences for discharge are:  Home Patient/Patient Representative was presented with a list of facilities, agencies and/or dme providers and Referral(s) placed for: None Mode of Transportation  Private car  (add comment for special considerations) CM D/C Readiness  PASRR completed and approved N/A Authorization number obtained, if required N/A Is there a 3 day INPATIENT Qualifying stay for Medicare Patients? N/A Medicare IM- signed, dated, timed and scanned, if required Yes DME Authorized/Delivered N/A No needs identified/ follow up with PCP/MD Yes Post acute care services secured W10 complete N/A Pri Completed and Accepted  N/A Is the destination address correct on the W10 N/A Finalized Plan  Expected Discharge Date 11/01/21 Discharge Disposition Home or Self Care  I identified my role as CM/TC from Care Management department.  Explained Medicare Important Message to patient over the phone. Patient stated understanding of the notice and was in agreement with the discharge plan. Patient advised of the right to call the [employer]) at [phone] if not in agreement with the discharge from the hospital. Copy mailed/routed to MyChart to patient after confirming address.?Gretna Bergin B. Christin Fudge, RN, Mining engineer, SLA-2 & V2-NMHB/Cell: [phone]

## 2021-11-01 NOTE — Plan of Care
Plan of Care Overview/ Patient Status    Pt A/Ox4, makes needs known, cooperative w/care, Ax1 w/R NWB to LLE, Pt compliant.  Discussed POC and medication w/ Pt whom verbalized understanding. OOB to chair w/ BLE elevated. Left foot dressing changed by podiatry - c/d/I. No c/o pain. PO Cipro/Doxy ADM per MAR.  Discussed POC and medication w/ Pt whom verbalized understanding.Tristan Schroeder, RN5/05/2022 1:51 PM

## 2021-11-02 ENCOUNTER — Encounter: Admit: 2021-11-02 | Payer: PRIVATE HEALTH INSURANCE

## 2021-11-04 ENCOUNTER — Encounter: Admit: 2021-11-04 | Payer: PRIVATE HEALTH INSURANCE

## 2021-11-04 NOTE — Discharge Summary
[facility]SrcMed/Surg Discharge SummaryPatient Data:  Patient Name: Edwin [name] Admit date: 10/14/2021 Age: 71 y.o. Discharge date: 11/01/2021  DOB: [birth year]	 Discharge Attending Physician: Ellison Carwin  MRN: [id number]	 Discharged Condition: stable PCP: Logan Bores, MD Disposition: Home  Principal Diagnosis: Left foot infectionSecondary Diagnoses:  T2DM, CADIssues to be Addressed Post Discharge: Issues to be Addressed Post Discharge:Please follow up with your podiatrist in [place] within 1-2 weeks of dischargePlease follow up with Dr. Hanley Hays (infectious disease in [place]), call to make appointment.  Follow up with Dr. Jonita Albee upon your return to [place]. Relevant Medications on Discharge:As noted in med recPending Labs and Tests: n/aFollow-up Information:Dr. Doree Albee an appointment as soon as possible for a visitPodiatristCall your podiatrist to make follow up appointment in [place] to monitor left foot and for post-operative care.Schedule an appointment as soon as possible for a visit No future appointments.Hospital Course: Hospital Course: Edwin [name] is a 71 y.o. male who was admitted to the podiatric surgical service on 10/14/2021 under Dr. Matthias Hughs for management of a left foot infection. Upon admission, the patient was started on IV vanc/zosyn which was later modified to vanc/fluconazole on 10/29/21 based on culture and clinical picture. Lab work and pre-operative tests (including chest X-ray and EKG) were ordered and a medicine consult and risk stratification was obtained.  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 He tolerated the anesthesia and procedure well with no complications. Dressings were changed as indicated by the podiatry team. Intra-op micro and path clean margins were positive for S.aureus, P.aeruginosa, S.viridans, and acute OM of multiple bones.The patient was taken back to the operating room on 10/25/21 for a left foot metatarsal resections 1-5 with delayed primary closure. He tolerated the anesthesia and procedure well with no complications. Dressings were changed as indicated by the podiatry team. He will be discharged on doxycycline and ciprofloxacin until he follows up with infectious disease doctor in [place], patient has been on doxy cipro since 10/31/2021.  He will be discharged on fluconazole for to a total treatment time of 6 months, he started taking it on 10/29/21, this will also be managed by Infectious Disease in [facility] in [place].  He will be discharged to home in [place] and will follow up with his podiatrist and within 1-2 weeks of discharge.  He will follow up with infectious disease doctor, Dr. Hanley Hays, in [place] after calling to make an appointment.  Patient signed release of medical records form that was faxed to [facility] prior to discharge.Consults: Medicine for risk stratification and co-management, Infectious disease for antibiotic guidance and duration, Vascular surgery for perfusion assessmentInpatient Consultants and summary of recommendations:IM:# left foot infection- Pathology negative for OM at clean margins  - ID recommending 6 months fluconazole 400mg  upon discharge and continuation of cipro/doxy until infectious disease follow up in [place].  # type 2 diabetes - A1c 6.8 in 06/2021. Glucose well controlled off basal, with sliding scale use only while inpatient. - continue home regimen of metformin and dapagliflozin- neuropathic pain - continue gabapentin 300 mg TID # Creatinine elevation - mild increase in creatinine from 0.8 starting on 5/6. Normal PO intake, and urine output has not been documented. Differential includes pre-renal AKI from hypovolemia, pre-renal AKI from hypotension, variation of creatinine within baseline range.- Cr remained stable throughout the duration of his hospitalization after a slight increase on 5/6. Encouraged good PO  intake and follow up with PCP for continued monitoring. # Soft blood pressures - asymptomatic hypotension with spontaneous resolution from 5/6 - 5/7. Periods of hypotension are not always accompanied by bradycardia, so unclear if dose reduction of metoprolol would be helpful. No evidence of hypotension secondary to infection. - continue metoprolol- can consider dose reduction to 12.5 mg BID hypotension and bradycardia return at the discretion of his PCP outpatient # CAD- continue aspirin 81 mg daily- continue metoprolol tartrate 25 mg BID- continue rosuvastatin 10 mg every 48 hours (limited in the setting of statin associated myalgias) # Vitamin B12 deficiency and microcytic anemia- continue cyanocobalamin 1000 mg dailyID:-keep doxycycline and ciprofloxacin until patient is seen by ID in [place]-continue fluconazole, at least 6 monthsVS: 67M with CAD s/p remote PCI with x3 stent placement, CABG Jan 2022, controlled DM 6.1%, b/l LE OM s/p b/l toe amputations. ABI/PVR 4/25 - 1.2. Ultrasound patent.  Has adequate perfusion to heal podiatric surgery.Pertinent Procedures or Surgeries: Surgical and Procedural Summary this Admission   Past Procedures (10/14/2021 to Today)   Date Procedures Providers Location  10/15/2021 LEFT FOOT TRANSMETATARSAL AMPUTATION----Left [facility] MAIN OR  10/25/2021 LEFT FOOT METATARSAL RESECTIONS 1, 2, 3, 4, 5 WITH COMPLED DELAYED PRIMARY CLOSURE AND TENDO ACHILLES LENGTHENING.----Left;METATARSAL RESECTIONS 3 AND 4----Left;SECONDARY CLOSURE, SURGICAL WOUND/DEHISCENCE, EXTENSIVE/COMPLICATED----Left;LENGTHENING/SHORTENING, TENDON, LEG/ANKLE; SINGLE TENDON (SEP PROC)----Left [facility] (Primary)[facility], [facility] MAIN OR    Pertinent lab findings and test results: Objective: Recent Labs Lab 05/09/230503 05/11/230741 05/12/230812 WBC 8.3 6.5 6.4 HGB 9.6* 10.5* 11.2* HCT 29.90* 32.50* 35.40* PLT 292 331 348  Recent Labs Lab 05/09/230503 05/11/230741 05/12/230812 NEUTROPHILS 68.3 61.3 59.7  Recent Labs Lab 05/08/230559 05/08/230832 05/09/230503 05/09/230826 05/10/231514 NA 139  --  138  --  137 K 4.0  --  4.0  --  4.9 CL 107  --  106  --  102 CO2 21  --  21  --  21 BUN 15  --  13  --  15 CREATININE 1.00  --  0.90  --  1.00 GLU 131*   < > 138*   < > 153* ANIONGAP 11  --  11  --  14  < > = values in this interval not displayed.  Recent Labs Lab 05/08/230559 05/09/230503 05/10/231514 CALCIUM 9.7 9.8 10.9* MG 1.5* 1.7  --   No results for input(s): ALT, AST, ALKPHOS, BILITOT, BILIDIR in the last 168 hours. No results for input(s): PTT, LABPROT, INR in the last 168 hours. Culture Information:Micro: 10/25/2021 - final1. L foot 1st MT CM:  Yeast broth only2. L foot 2nd MT CM:  Candida albicans3. L foot 4th MT CM: NGTD4. L foot 5th MT CM: NGTD Path: 10/25/2021 - final1.  LEFT 1ST METATARSAL CLEAN MARGIN #2:             - REMODELING BONE AND FATTY MARROW, NEGATIVE FOR OSTEOMYELITIS 2.  LEFT 2ND METATARSAL CLEAN MARGIN #2:        - VIABLE CORTICLE BONE, NEGATIVE FOR OSTEOMYELITIS 3.  LEFT 4TH METATARSAL CLEAN MARGIN #2:             - VIABLE BONE AND FATTY MARROW, NEGATIVE FOR OSTEOMYELITIS 4.  LEFT 5TH METATARSAL CLEAN MARGIN #2:        - REMODELING CORTICLE BONE, NEGATIVE FOR OSTEOMYELITIS 5.  LEFT FOOT METATARSAL RESECTION:        - ACUTE OSTEOMYELITIS X1      - CHRONIC INACTIVE OSTEOMYELITIS X3   Micro: 10/15/2021 -  final1. Left  2nd toe wound -  strep viridans2. Left 3rd toe bone -  strep viridans3. Left foot tissue - Pseudomonas aeruginosa, mixed GP/GN organism4. Left 1st metatarsal clean margin - NGTD5. Left 2nd metatarsal clean margin - NGTD6. Left 3rd metatarsal clean margin - NGTD7. Left 4th metatarsal clean margin- SA8. Left 5th metatarsal clean margin- SA Path: 10/15/2021 - FINAL DIAGNOSIS 1.  LEFT 2ND TOE BONE, BIOPSY:        - ACUTE OSTEOMYELITIS 2.  LEFT 3RD TOE BONE, BIOPSY:             - ARTICULAR CARTILAGE AND VIABLE BONE WITH ACUTE OSTEOMYELITIS AND FEATURES OF OSTEOARTHRITIS      - VIABLE SOFT TISSUE WITH ACUTE AND CHRONIC INFLAMMATION 3.  LEFT FOOT TISSUE, DEBRIDEMENT:             - BONE WITH ACUTE OSTEOMYELITIS AND OVERLYING ACUTELY INFLAMED SOFT TISSUE 4.  LEFT 1ST METATARSAL, CLEAN MARGIN:             - VIABLE BONE WITH FATTY MARROW AND AGGREGATES OF ACUTE INFLAMMATORY CELLS CONSISTENT WITH FOCAL ACUTE OSTEOMYELITIS 5.  LEFT 2ND METATARSAL, CLEAN MARGIN:             - VIABLE AND FOCALLY DEVITALIZED BONE WITH FATTY MARROW AND ACUTE INFLAMMATORY CELLS CONSISTENT WITH FOCAL ACUTE OSTEOMYELITIS 6.  LEFT 3RD METATARSAL CLEAN MARGIN:             - VIABLE BONE WITH FATTY MARROW AND SCATTERED ACUTE INFLAMMATORY CELLS BUT NO EVIDENCE OF OSTEOMYELITIS      - VIABLE SOFT TISSUE WITHOUT ACUTE INFLAMMATION 7.  LEFT 4TH METATARSAL, CLEAN MARGIN:             -  VIABLE BONE WITH FATTY MARROW AND NO EVIDENCE OF OSTEOMYELITIS      - VIABLE SOFT TISSUE WITHOUT ACUTE INFLAMMATION 8.  LEFT 5TH METATARSAL, CLEAN MARGIN:             - VIABLE SOFT TISSUE WITH PERIOSTEAL REACTIVE BONE FORMATION BUT NO MATURE BONE OR INFLAMMATION  9.  LEFT FOOT, TRANSMETATARSAL AMPUTATION:             - THIRD, FOURTH, AND FIFTH TOES WITH ARTICULAR CARTILAGE AND VIABLE BONE WITH FATTY MARROW, RARE ACUTE INFLAMMATORY CELL, BUT NO EVIDENCE OF OSTEOMYELITIS, AND VIABLE SOFT TISSUE WITH VERY FOCAL ACUTE INFLAMMATION       - THREE OF FOUR SEPARATE SPECIMENS EXHIBITING ACUTE OSTEOMYELITIS      - DORSAL AND PLANTER SURGICAL MARGINS EXHIBITING ERODED AND ULCERATED SKIN AND UNDERLYING ACUTE INFLAMMATION Imaging: Imaging results available in EpicDiet:   Consistent CarbohydrateMobility: Highest Level of mobility - ACTUAL: Mobility Level 7, Walk 25+ feet, AM PAC 22-23Physical Therapy Disposition Recommendation: HomeAdditional Physical Therapy Disposition Recommendations: Home Physical TherapyPhysical Exam Discharge vitals: Temp:  [97.8 ?F (36.6 ?C)-98.2 ?F (36.8 ?C)] 98.1 ?F (36.7 ?C)Pulse:  [72-95] 76Resp:  [18-20] 18BP: (111-139)/(74-83) 121/74SpO2:  [97 %-100 %] 99 %Device (Oxygen Therapy): room air Cognitive Status at Discharge: BaselineDischarge Physical Exam:Physical ExamGen: Alert, no distressLeft foot: Dressing left intact.  Left foot:  Left foot warm and well-perfused.  TMA surgical incision well coapt with sutures intact.  No drainage noted from incision.  No edema.  Erythema around surgical incision only noted anteriorly/dorsally 1cm from incision, not noted plantar.  No fluctuance, crepitus, proximal streaking, purulent drainage. Achilles surgical incisions well coapt with sutures intact.  No drainage noted from incision.  No erythema or edema.  No fluctuance, crepitus, proximal streaking, purulent drainage.Allergies Allergies  Allergen Reactions  Lipitor [Atorvastatin] Intolerance   Joint pain (unclear if myalgia)  Linezolid Vomiting   Nausea and vomiting   PMH PSH No past medical history on file. No past surgical history on file. Social History Family History Social History Tobacco Use  Smoking status: Not on file  Smokeless tobacco: Not on file Substance Use Topics  Alcohol use: Not on file  History reviewed. No pertinent family history. Discharge Medications: Discharge: Current Discharge Medication List  START taking these medications  Details ciprofloxacin HCl (CIPRO) 500 mg tablet Take 1 tablet (500 mg total) by mouth every 12 (twelve) hours.Qty: 20 tablet, Refills: 0Start date: 11/01/2021  cyanocobalamin 1000 MCG tablet Take 1 tablet (1,000 mcg total) by mouth daily.Qty: 30 tablet, Refills: 11Start date: 11/02/2021  doxycycline hyclate (VIBRAMYCIN) 100 mg capsule Take 1 capsule (100 mg total) by mouth every 12 (twelve) hours.Qty: 20 capsule, Refills: 0Start date: 11/01/2021  fluconazole (DIFLUCAN) 200 mg tablet Take 2 tablets (400 mg total) by mouth daily for 7 days.Qty: 7 tablet, Refills: 0Start date: 11/02/2021, End date: 11/09/2021  oxyCODONE (ROXICODONE) 5 mg Immediate Release tablet Take 1 tablet (5 mg total) by mouth every 8 (eight) hours as needed for pain for up to 3 days.Qty: 9 tablet, Refills: 0Start date: 11/01/2021, End date: 11/04/2021  rosuvastatin (CRESTOR) 10 mg tablet Take 1 tablet (10 mg total) by mouth every other day.Qty: 30 tablet, Refills: 0Start date: 11/03/2021  walker Misc Use as directed.Qty: 1 each, Refills: 0Start date: 10/30/2021   CONTINUE these medications which have CHANGED  Details escitalopram oxalate (LEXAPRO) 10 mg tablet Take 1 tablet (10 mg total) by mouth every morning.Qty: 30 tablet, Refills: 0Start date: 11/01/2021   CONTINUE these medications which have NOT CHANGED  Details aspirin 325 mg EC tablet Take 1 tablet (325 mg total) by mouth daily.  dapagliflozin-metformin (XIGDUO XR) 5-1,000 mg per tablet Take 2 tablets by mouth.  gabapentin (NEURONTIN) 300 mg capsule Take 1 capsule (300 mg total) by mouth 3 (three) times daily with meals.  metoprolol succinate XL (TOPROL-XL) 50 mg 24 hr tablet Take 1 tablet (50 mg total) by mouth daily. Take with or immediately following a meal.   There was at total of approximately 30 minutes spent on the discharge of this patientElectronically Signed:Rafi Lyla Son 11/01/2021 9:55 AMBest Contact Information: [phone] Signed by Paulene Floor, DPM, Nov 01, 2021

## 2021-11-04 NOTE — Progress Notes
[name] Care Navigation:  Transition of Care Note Care Navigation spoke with: PatientDischarging Hospital: [name] [name] Admission Date: 4/24/2023Hospital Discharge Date: 11/01/2021    Diagnosis: Principal Diagnosis: Left foot infectionDischarge location: HomePt is back in [name] patient admitted: Edwin [name] is a 71 y.o. male who was admitted to the podiatric surgical service on 10/14/2021 under Dr. Matthias Hughs for management of a left foot infection. 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 The patient was taken back to the operating room on 10/25/21 for a left foot metatarsal resections 1-5 with delayed primary closure.Diagnosis at discharge: Principal Diagnosis: Left foot infectionCURRENT STATE:Since discharge patient reports feeling: Better Patient cared for by: SpouseREVIEW OF AFTER VISIT SUMMARY DOCUMENT: MEDICATION CHANGES:Validated NEW medications to take: YesValidated Changed medications to take:  Validated Stopped medications to NOT take: YesIssues obtaining prescriptions: No FOLLOW-UP APPOINTMENTS and TRANSPORTATION:Patient aware of scheduled appointments: YesPt has appt with MD in [place] tomorrow 5/16Awareness and assistance with appointments needing to be scheduled: NoTransportation concerns for follow-up appointment: NoDME and HOME HEALTH SERVICES:Durable medical equipment received: YesPt using walkerContact has been made with home care agency: N/APlan established for follow up labs/tests: N/AOther follow up items: Pt has left foot dressing s/p surgeryADDITIONAL PATIENT NEEDS:  Additional issues/barriers identified: Issues to be Addressed Post Discharge:1. Please follow up with your podiatrist in [name] within 1-2 weeks of discharge2. Please follow up with Dr. Hanley Hays (infectious disease in [name]), call to make appointment.  3. Follow up with Dr. Jonita Albee upon return to [facility] placed to pt following recent hospital discharge. Pt is back in [name], returned 5/13. Has care in place. Medications and appts reviewed, states he is following AVS. Goals of call met.

## 2021-11-15 NOTE — Other
[facility] HOSPITALOPERATIVE REPORTCONFIDENTIAL - DO NOT COPY WITHOUT APPROPRIATE AUTHORIZATIONName:  YOAV, OKANE CService:  SLA2Unit No:  [id number] CSN:  [id number] Service Area:  PodDate of Birth:  22-Aug-1952Date of Adm:  04/24/2023Dictated:  10/25/2021 14:33:28          Dictated by:  Verne Carrow, D.P.M.Transcribed:  10/25/2021 16:36:56       [id number] DATE OF PROCEDURE/SURGERY:  5/5/23OPERATION:Left foot incision bone cortex metatarsal 1, 2, and 5Left foot metatarsal resections 3 and 4Left foot complex delayed primary closure of surgical woundLeft tendo-Achilles lengthening.OPERATOR:Maddyn Lieurance Zoila Shutter, DPMASSISTANT SURGEON:Michael Melanee Spry Gazes, DPMASSISTANT:Rafi Fasihuddin, D.P.M.ANESTHESIA:MAC with local anesthetic.PREOP DIAGNOSIS:Left foot osteomyelitis.S/p left transmetatarsal amputationLeft foot open surgical woundLeft equinus ankle contracturePOSTOP DIAGNOSIS:Left foot osteomyelitis.S/p left transmetatarsal amputationLeft foot open surgical woundLeft equinus ankle contractureESTIMATED BLOOD LOSS:50 cc.SPECIMENS REMOVED:Microbiology: 1. Left foot 1st metatarsal, clean margin. 2. Left foot 2nd metatarsal, clean margin. 3. Left foot 4th metatarsal, clean margin. 4. Left foot 5th metatarsal, clean margin. Pathology: 1. Left foot 1st metatarsal, clean margin. 2. Left foot 2nd metatarsal, clean margin. 3. Left foot 4th metatarsal, clean margin. 4. Left foot 5th metatarsal, clean margin. 5. Left foot metatarsal resection.HEMOSTASIS:  Bovie electrocautery, 3-0 Vicryl.INJECTABLE:  30 cc of 0.5% Marcaine plain.FINDINGS:  Left foot previous TMA with sutures intact medially and laterally and full-thickness granular wound centrally.  No purulence expressed from dissection or upon incision.  Postop distal to the amputation site soft.  Postop proximal amputation site hard.  Bleeding intraoperatively was acceptable.  Upon closure, flaps noted to have capillary refill time within normal limits.PROCEDURE:  The patient was brought to the operating room, placed on the operating table in a supine position.  Venodyne boots were placed on the patient's contralateral calf.  A time-out was called per [facility] guidelines, the patient identification, proper procedure, and laterality were verified. The patient was receiving intravenous antibiotics on a scheduled, inpatient basis.  All bony prominences were well-padded.  Following IV sedation, 30 cc of 0.5% Marcaine plain was administered to the left foot for regional anesthesia.  The left foot was then scrubbed, prepped, and draped in the usual sterile manner.  Attention was then directed to the insertion of the Achilles tendon and posterior calcaneus.  Hash marks were drawn 2.5 cm proximal to the insertion of the Achilles tendon and then another 2.5 cm proximal and another 2.5 cm proximal.  A hemi-transection was carried out medially with a 15 blade.  Next, a hemi-transection was carried out laterally with a 15 blade of the Achilles tendon.  Lastly, a hemi-transection was carried medially with a 15 blade.  There was noted to be increased range of motion at the ankle with increased dorsiflexion to 10 degrees past neutral with knee extension.  The Achilles tendon was noted to be intact at the insertion site.  The surgical incisions were reapproximated and closed utilizing staples.  Attention was then directed to the previous surgical incision in the left foot.  At this time, all sutures were removed.  Utilizing a sterile 15 blade, the incision was carried out through the previous surgical incision, deepened to the level of bone.  At this time, the metatarsals were visualized.  All soft tissue was freed from the metatarsals utilizing an elevator.  At this time, transection was carried out to ensure an appropriate parabola utilizing a sagittal saw from dorsal to plantar.  The transected bone was carried out to the back table.  At this time, all devitalized and nonviable tissue was sharply excised from the wound.  The foot  was then copiously lavaged with sterile saline.  At this time, a 3-0 Vicryl was used to ligate the plantar medial artery, hemostasis was noted.  Next, Irrisept solution was used to thoroughly lavage the surgical wound, for period of 90 seconds.  The foot was lavaged with sterile saline.  The foot was then placed on a sterile towel on and all surgeons involved changed into sterile gloves.  Clean margin specimens were obtained from metatarsals 1 through 5 utilizing a clean sagittal saw for metatarsal transections.  The clean margin specimens were obtained 1 mm proximal to the previous amputation site.  They were sent to the back table from Microbiology and Pathology.  At this time, the surgical wound was assessed for closure strategy.  The flaps were remodeled to ensure appropriate closure.  At this point, the plantar aspect of the flap was debulked to ensure optimal closure.  The debulking was carried out with a sterile 15 blade.  At this time, the flaps were reapproximated and retention sutures were placed centrally, medially and laterally with 2 nylon suture.  At this time, the surgical incision was closed utilizing a 2-0 nylon suture in a simple interrupted and horizontal suture technique.  At this time, stapler was used to closely approximate the skin edges.   At this time, the foot was dressed with Betadine-soaked Adaptic, 4 x 4 gauze, cast padding, Kerlix roll, Ace bandage.  The patient tolerated the procedure and anesthesia well.  The patient was transferred to the recovery room with vital signs stable and vascular status intact to the distal aspect of the TMA site.  After a period of postoperative monitoring, the patient will be transferred back to the floor.  Dr. Paulene Floor usual scrubbed and present for all critical portions of the case.

## 2022-05-14 IMAGING — CR XR FOOT 3+ VIEWS RIGHT
1 series · 3 of 3 positions shown · non-contrast
Comparison: none

FINAL REPORT:
HISTORY: Possible osteomyelitis

[Series 8753: AP · right · 3 of 3 slices shown]
[im 1/3]
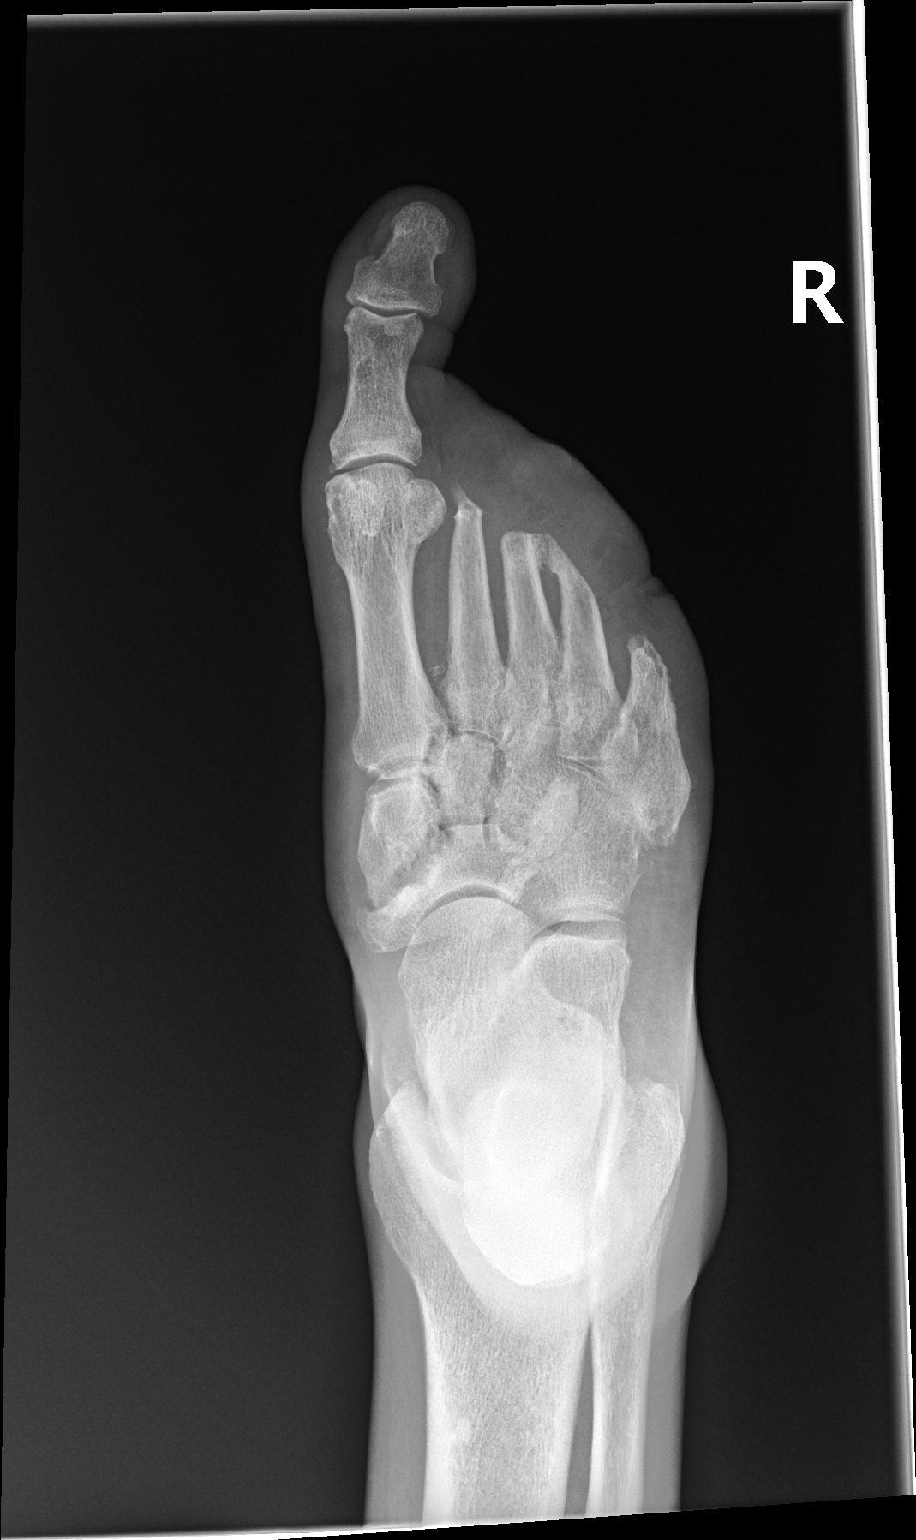
[im 2/3]
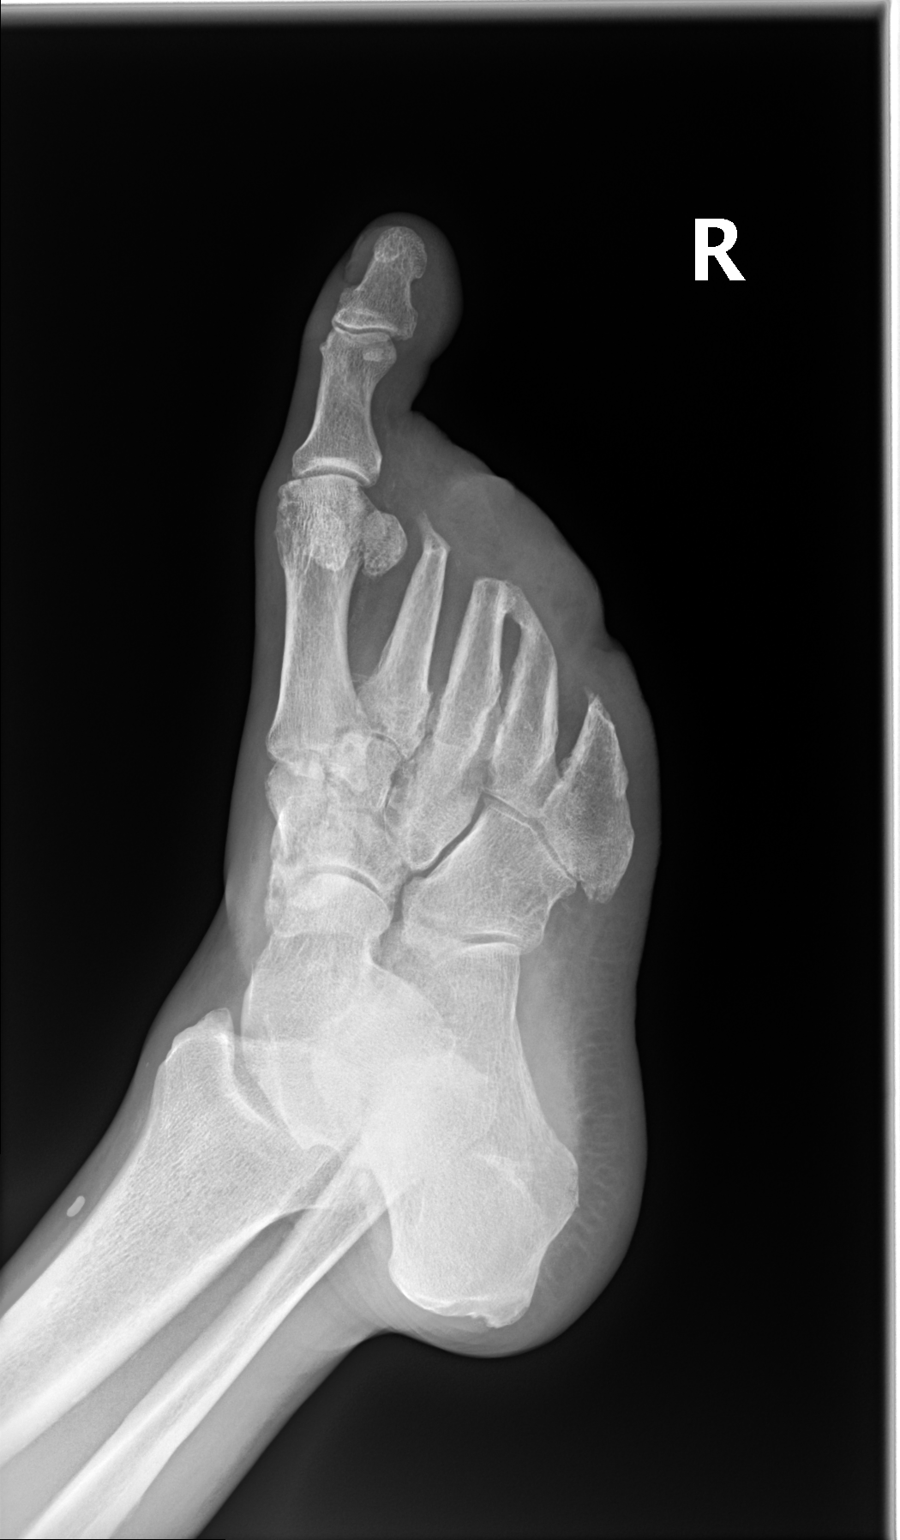
[im 3/3]
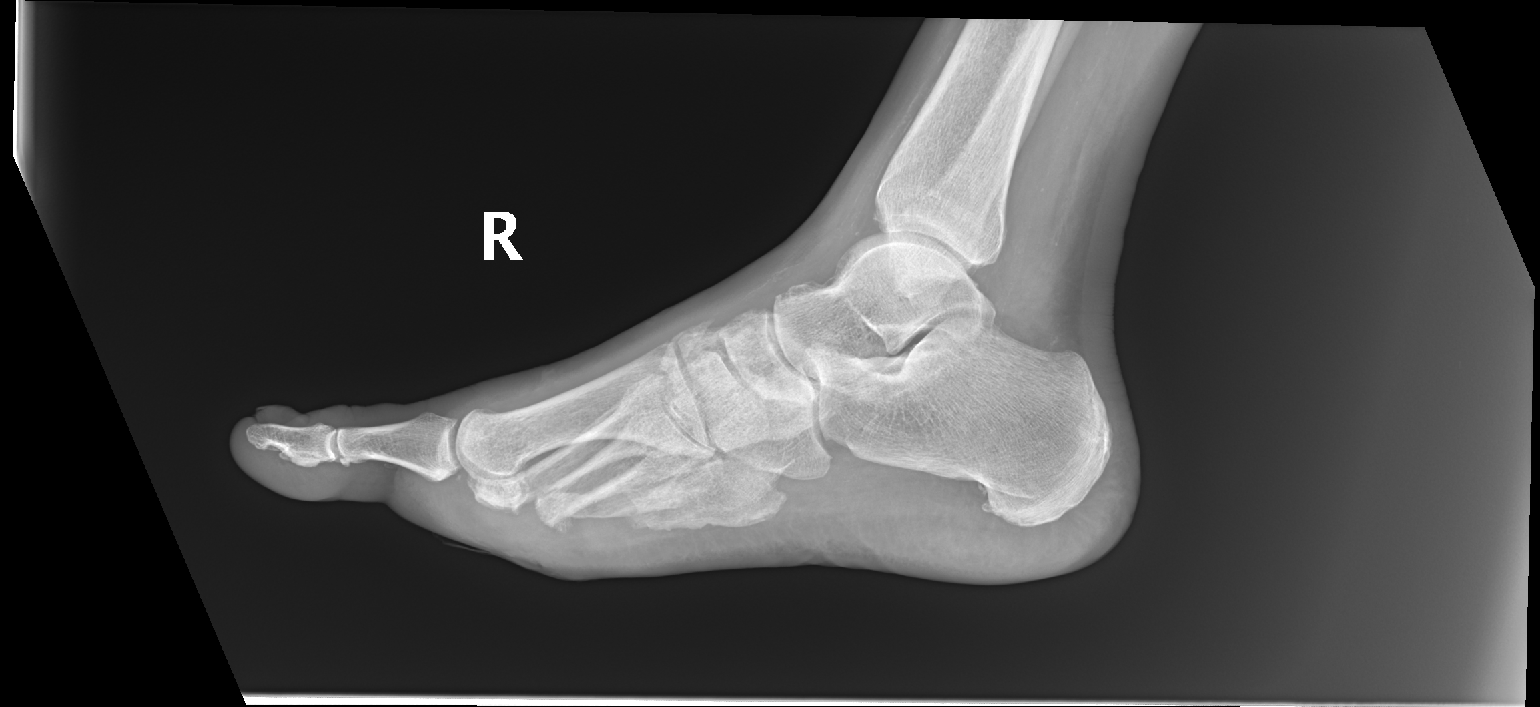

[3 of 3 positions shown; findings below may reference images not displayed]

FINDINGS: 3 views of the right foot. Comparison with 01/25/2020. Interval amputation of the second through fifth metatarsals. Smooth bony remodeling. Ulceration laterally. There is been interval erosion and irregularity of the distal tibial navicular but also seen in the first and second cuneiforms. Subchondral cysts in the fifth metatarsal stump. Smooth periosteal reaction. No immature periosteal reaction.
IMPRESSION: 1. Amputation of the second through fifth metatarsals. Smooth periosteal reaction without definitive bone destruction. However, the adjacent soft tissue erosion is in proximity to the cortical surface if there is any clinical suspicion for osteomyelitis recommend MRI
2. Bone irregularity and erosion involving the navicular and first 2 cuneiforms. This can represent early Charcot arthropathy as well as infection, consider MRI

## 2022-05-16 IMAGING — MR MRI FOOT RIGHT WITHOUT IV CONTRAST
7 of 8 series · 39 of 40 positions shown · non-contrast
Comparison: Radiographs of 05/14/2022

Evaluate forefoot for Osteo
Addendum:
(#[id number]
\F\[HOSPITAL]\F\
Communicated to: Dr Jehudiel
On behalf of: Dr. Abou Saleh Halme
By: Virgie Lin
At: [DATE]
On: 05/17/2022 EST
\F\/[HOSPITAL]\F\#)
FINAL REPORT:
HISTORY: Evaluate for foot for osteomyelitis.
Procedure: MRI of the right foot without contrast
TECHNIQUE: Multisequence, multiplanar imaging of the right foot was performed.

[Series 2001: survey · axial · right · 6.0mm · 0.59mm/px · 1 of 11 slices shown]
[im 1/11]
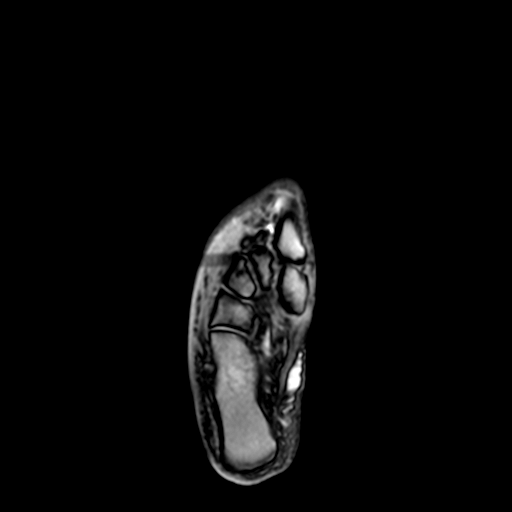

[Series 3001: STIR · sagittal · right · 3.0mm · 0.69mm/px · 6 of 30 slices shown]
[im 1/30]
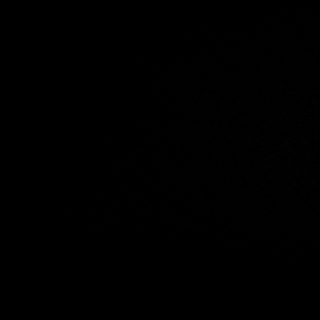
[im 6/30]
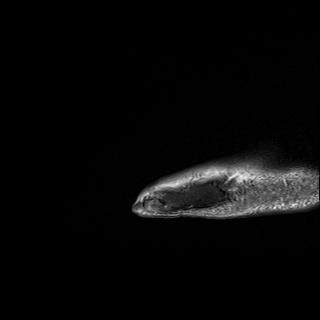
[im 12/30]
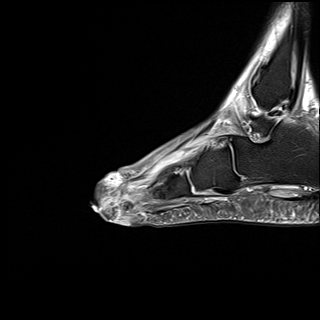
[im 18/30]
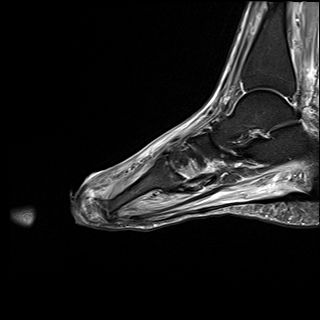
[im 24/30]
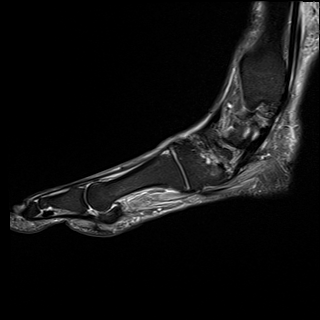
[im 30/30]
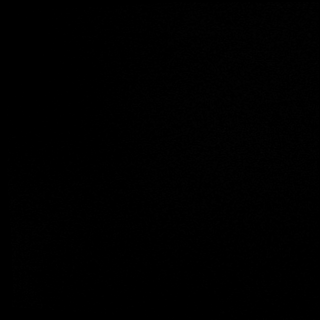

[Series 5001: T1 · sagittal · right · 3.0mm · 0.72mm/px · 6 of 30 slices shown (1 of 3)]
[im 1/30]
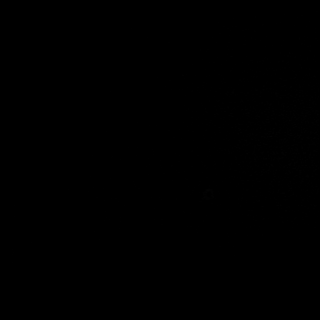
[im 6/30]
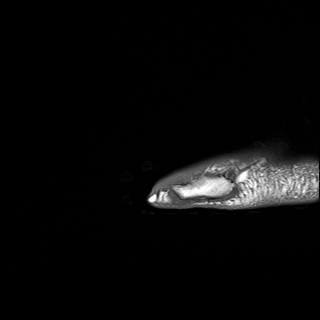
[im 12/30]
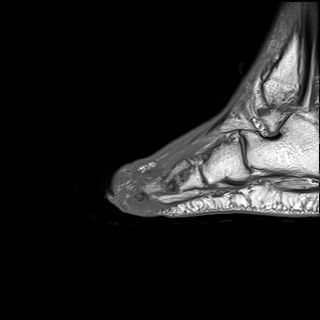
[im 18/30]
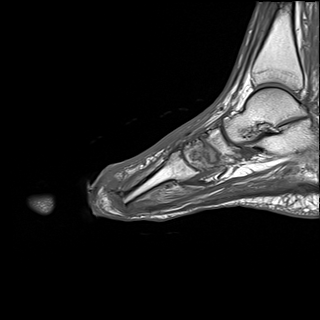
[im 24/30]
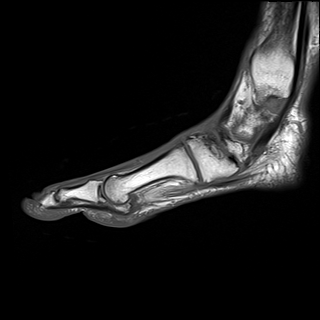
[im 30/30]
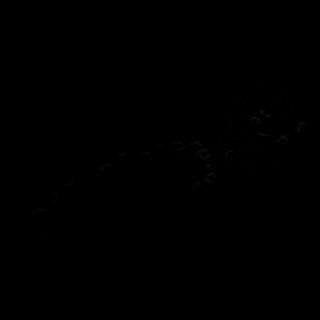

[Series 6001: T1 · axial · right · 4.0mm · 0.57mm/px · z∈[-129,-7]mm · 6 of 30 slices shown (2 of 3)]
[im 1/30]
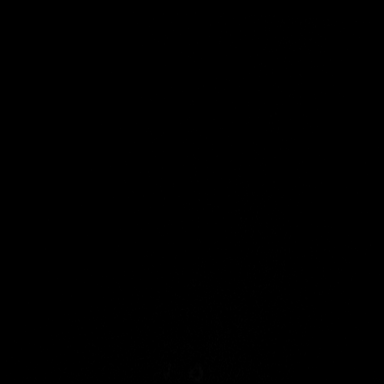
[im 6/30]
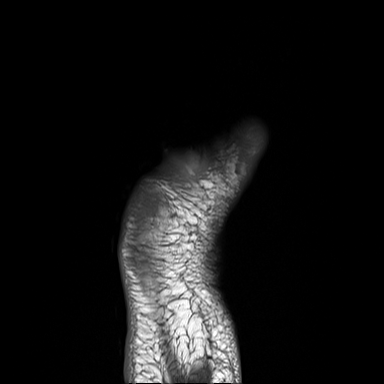
[im 12/30]
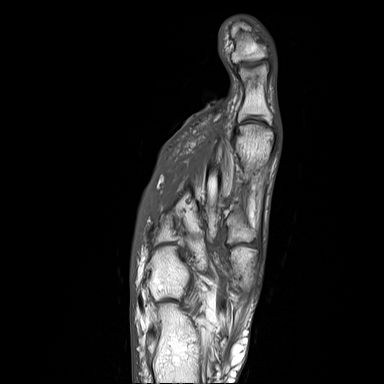
[im 18/30]
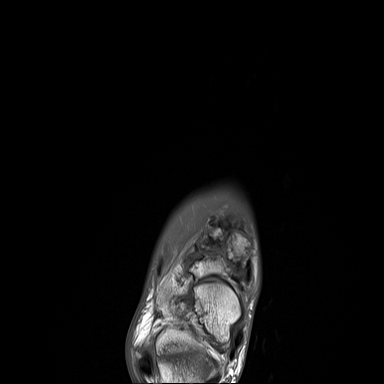
[im 24/30]
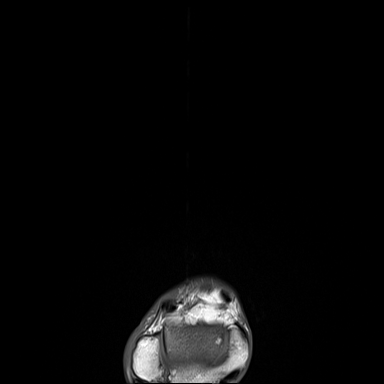
[im 30/30]
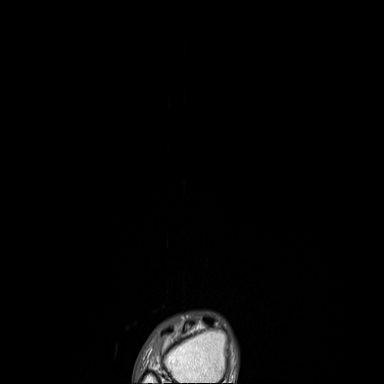

[Series 7001: T2 fat-sat · axial · right · 4.0mm · 0.69mm/px · z∈[-129,-7]mm · 6 of 30 slices shown (1 of 2)]
[im 1/30]
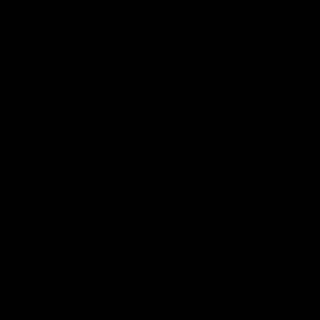
[im 6/30]
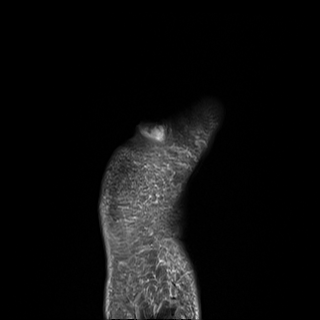
[im 12/30]
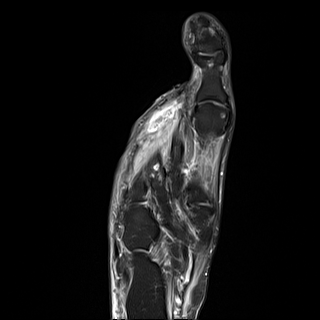
[im 18/30]
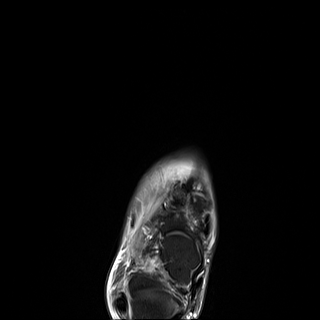
[im 24/30]
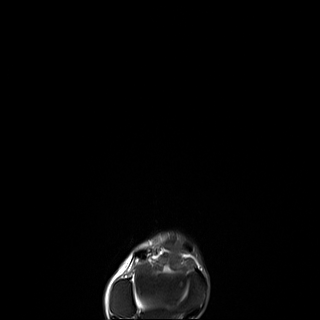
[im 30/30]
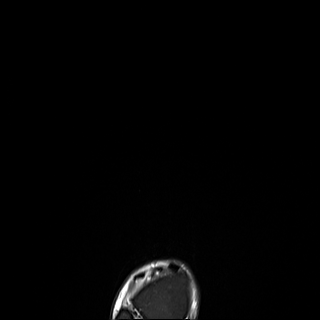

[Series 8001: T2 fat-sat · coronal · right · 4.0mm · 0.47mm/px · 7 of 37 slices shown (2 of 2)]
[im 1/37]
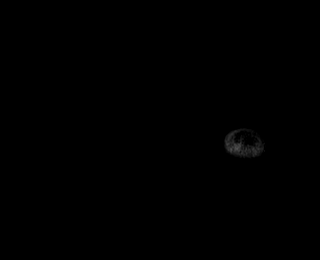
[im 7/37]
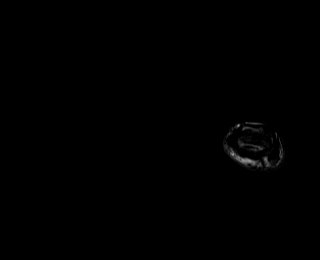
[im 13/37]
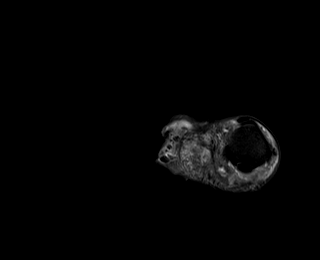
[im 19/37]
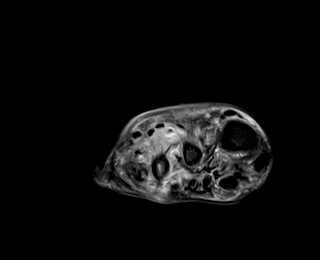
[im 25/37]
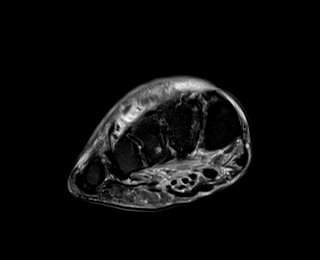
[im 31/37]
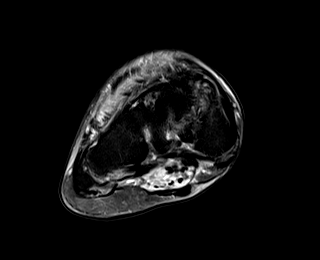
[im 37/37]
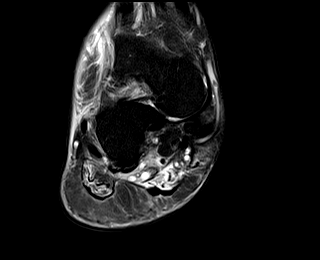

[Series 9001: T1 · coronal · right · 4.0mm · 0.47mm/px · 7 of 37 slices shown (3 of 3)]
[im 1/37]
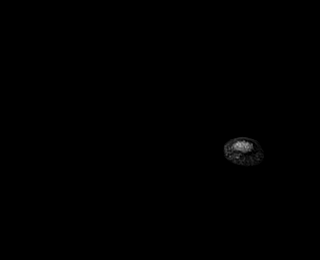
[im 7/37]
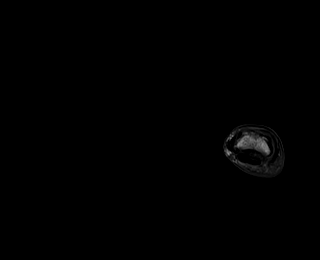
[im 13/37]
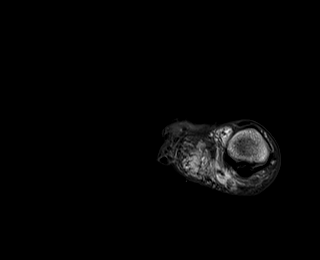
[im 19/37]
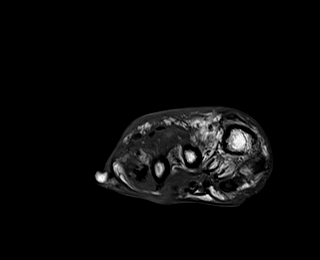
[im 25/37]
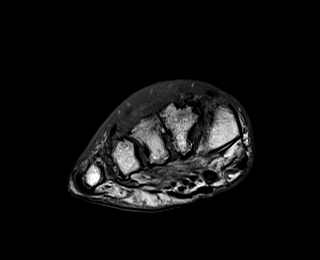
[im 31/37]
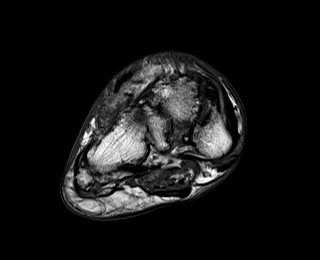
[im 37/37]
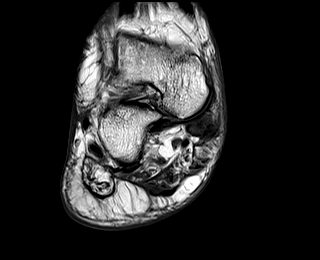

[39 of 40 positions shown; findings below may reference images not displayed]

FINDINGS: Notified of the presence of this study at the time of dictation, as it was in the outpatient folder. Edema like signal in the mid foot likely degenerative or Charcot joint. Transmetatarsal amputation involving all of the metatarsals except the first. A edema like signal in the third metatarsal. Question faint T1 hypointensity. A surrounding soft tissue edema is noted. There is overlying T2 hyperintensity suspicious for ulceration. This measures up to 2.5 x 1.0 cm. It does extend to the skin surface. There is moderate dorsal subcutaneous edema. Edema in the plantar musculature is moderately severe.
IMPRESSION: 
IMPRESSION: :
1. Edema like signal in the third metatarsal stump. There is no T1 signal alteration to definitively diagnose osteomyelitis. This can represent reactive osteitis or osteomyelitis. A follow-up MRI in 2-3 weeks may be helpful as clinically indicated
2. T2 hyperintensity near the third metatarsal stump measures 2.5 cm, extends to the skin surface. Assessment for abscess is limited without contrast but this may all represent ulceration
3. Moderate soft tissue edema

## 2022-05-21 IMAGING — US US LOW EXT ARTERIES RIGHT
1 series · 14 of 16 positions shown · non-contrast
Comparison: None

FINAL REPORT:
Study:US LOW EXT ARTERIES RIGHT
HISTORY: Right lower extremity osteomyelitis

[Series 1: us low ext arteries right · 14 of 26 slices shown]
[im 1/26]
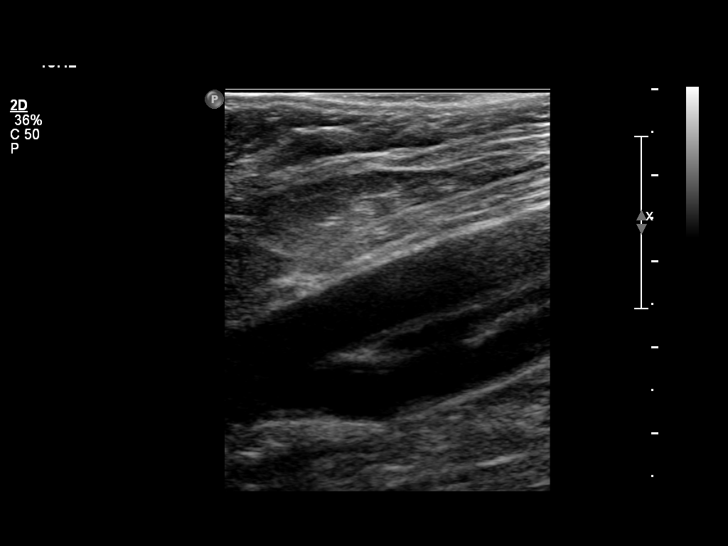
[im 2/26]
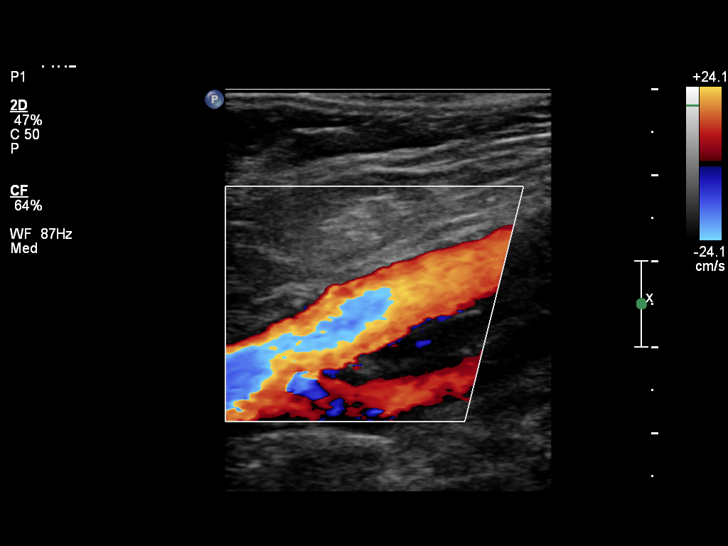
[im 4/26]
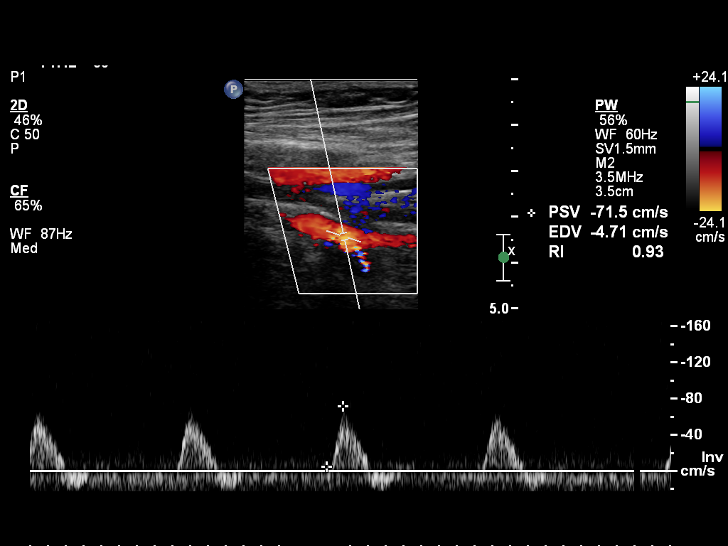
[im 7/26]
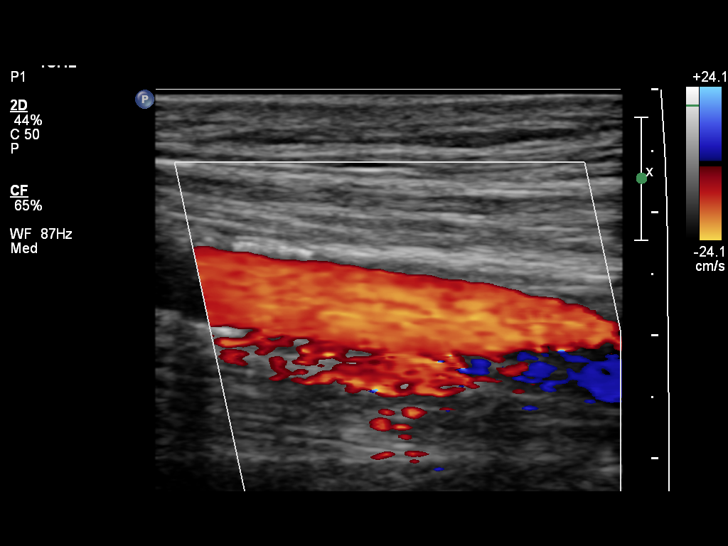
[im 9/26]
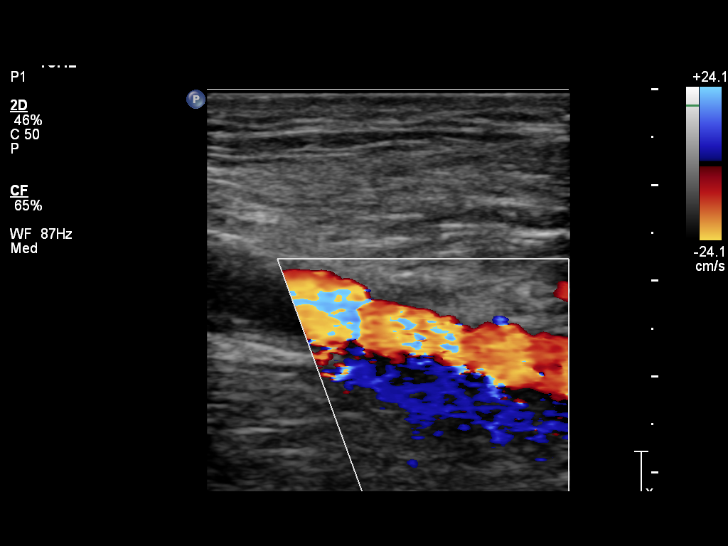
[im 11/26]
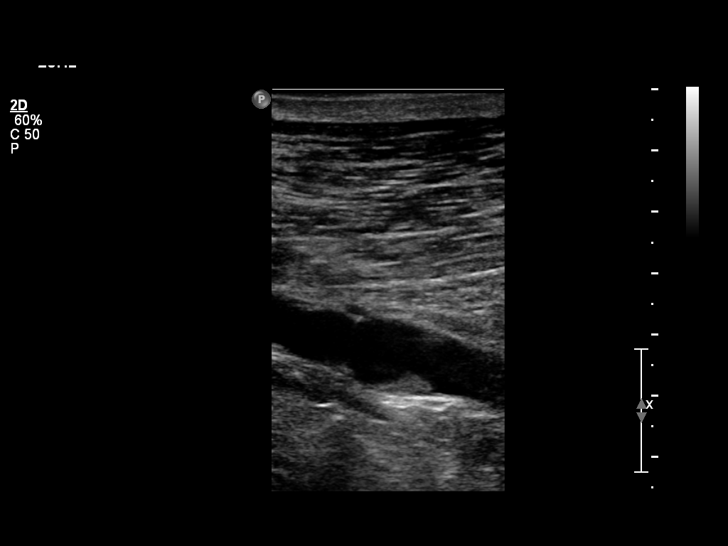
[im 12/26]
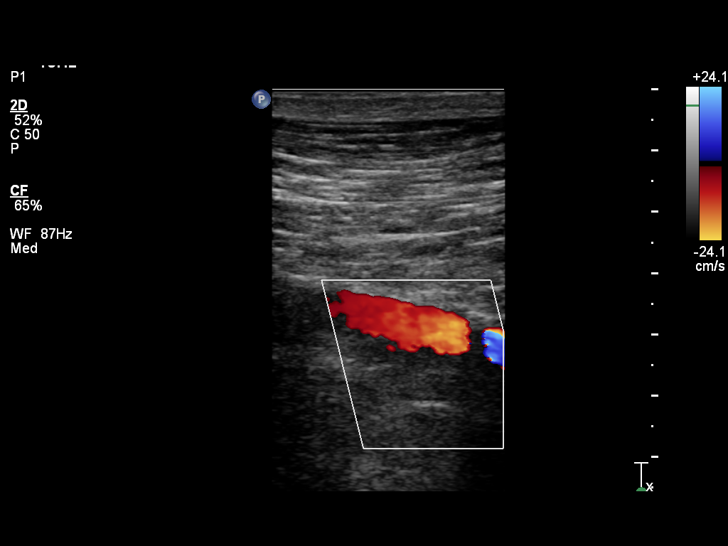
[im 14/26]
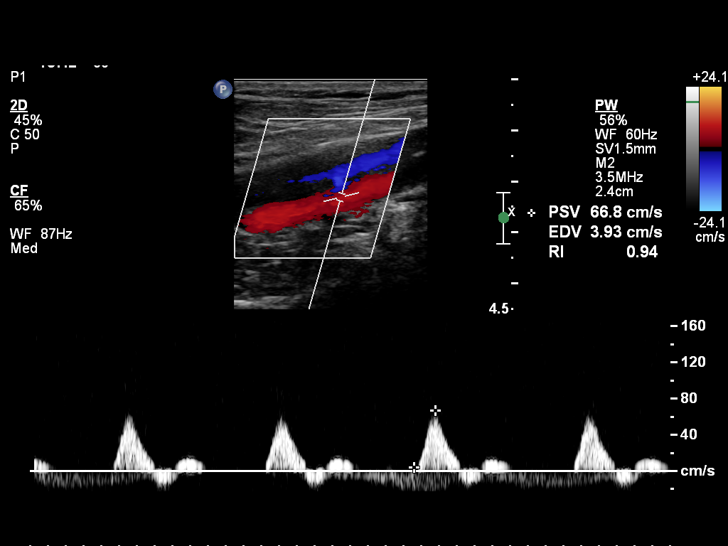
[im 16/26]
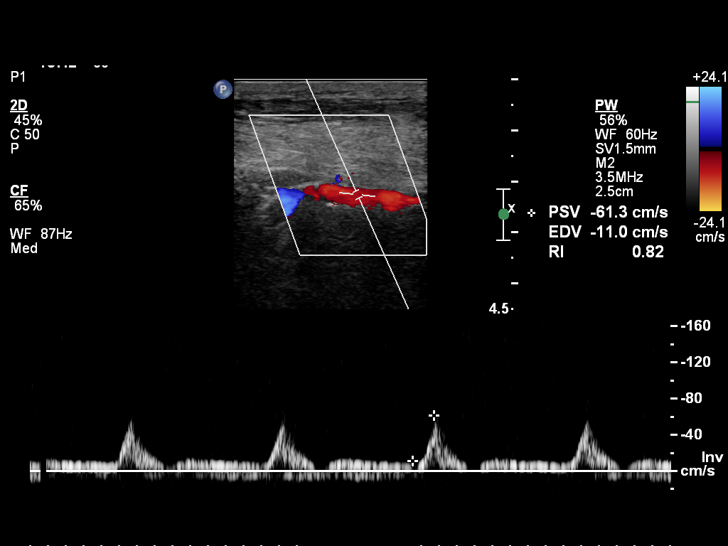
[im 17/26]
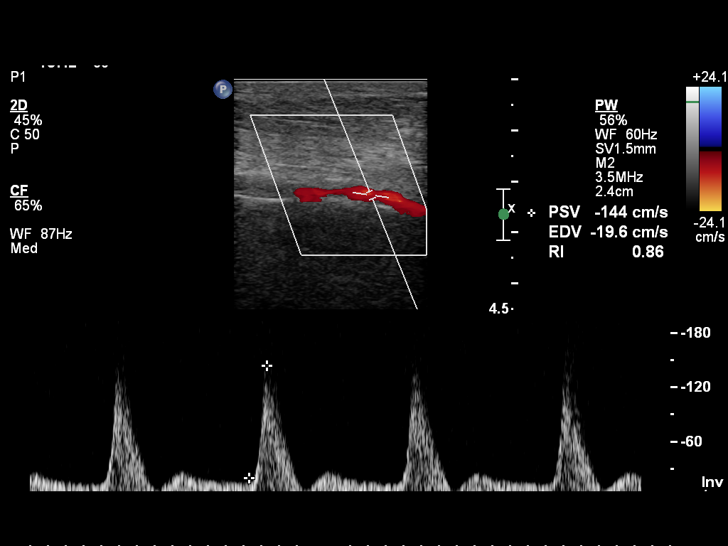
[im 21/26]
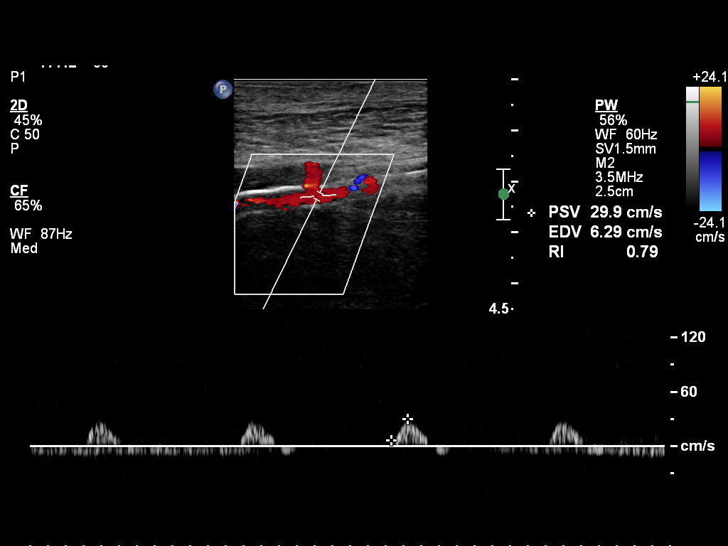
[im 22/26]
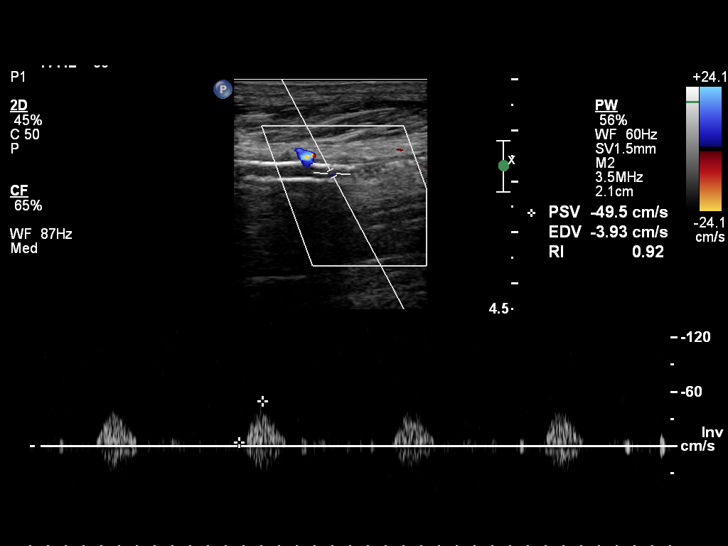
[im 24/26]
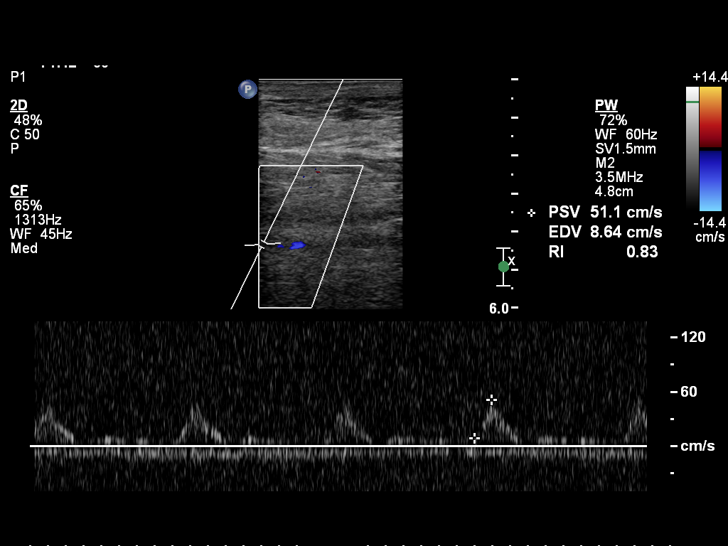
[im 26/26]
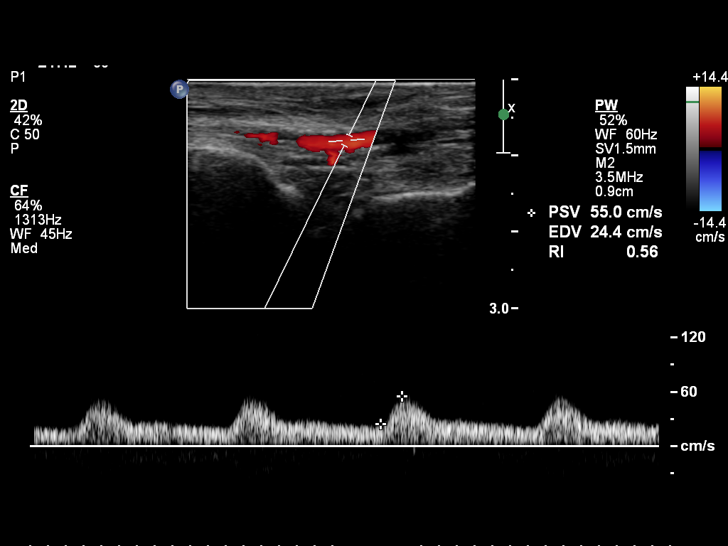

[14 of 16 positions shown; findings below may reference images not displayed]

FINDINGS: Grayscale, color, spectral Doppler ultrasound evaluation of the right lower extremity arteries was performed.
Peak systolic velocity of the right lower extremity arteries given below in cm/sec:
Common femoral: 115
Profunda femoris: 72
Proximal SFA: 82
Made SFA: 67
Distal SFA: 86
Popliteal: 67
Peroneal: 56
Posterior tibial: 50
Anterior tibial: 47
Dorsalis pedis: 55
Biphasic and monophasic waveforms involving the right lower extremity arteries below the level of the knee.
IMPRESSION: 
IMPRESSION: No sonographic evidence for hemodynamically stenosis involving the evaluated right lower extremity arteries. Biphasic and monophasic waveforms involving the right lower extremity arteries below the level of the knee.

## 2023-01-14 IMAGING — CR XR TOES 2+ VIEWS RIGHT
1 series · 3 of 3 positions shown · non-contrast
Comparison: None
There is previous second through fifth toe transmetatarsal amputation.

ADDENDUM:
Exam: Right toes 3 views.
FINAL REPORT:
Toes 3 views:
CLINICAL INDICATION: Pain/gangrene

[Series 136: AP · right · 3 of 3 slices shown]
[im 1/3]
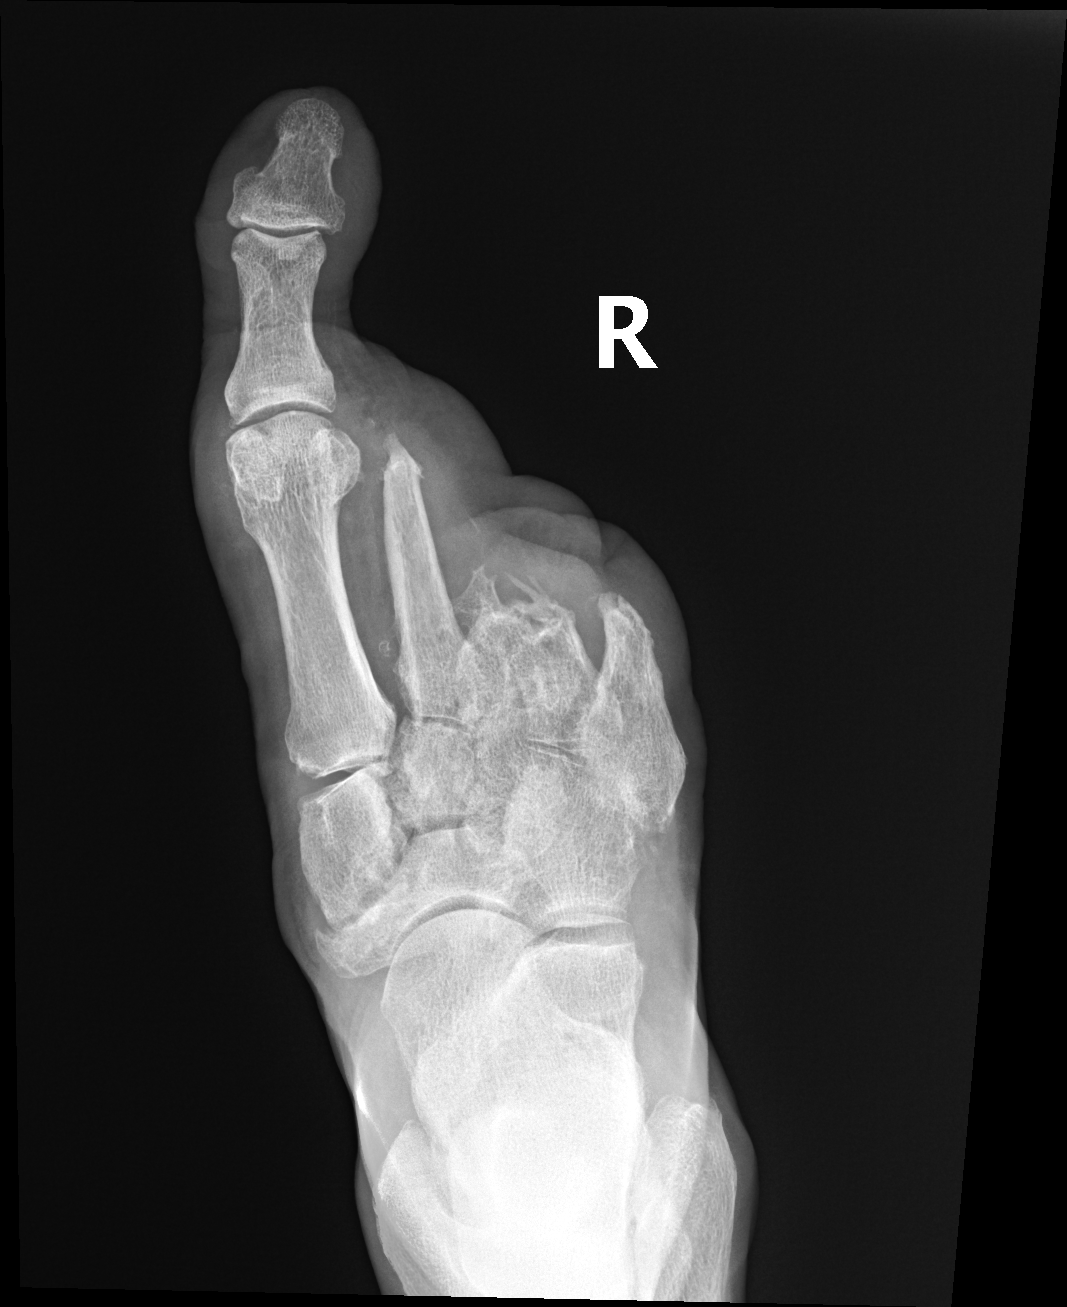
[im 2/3]
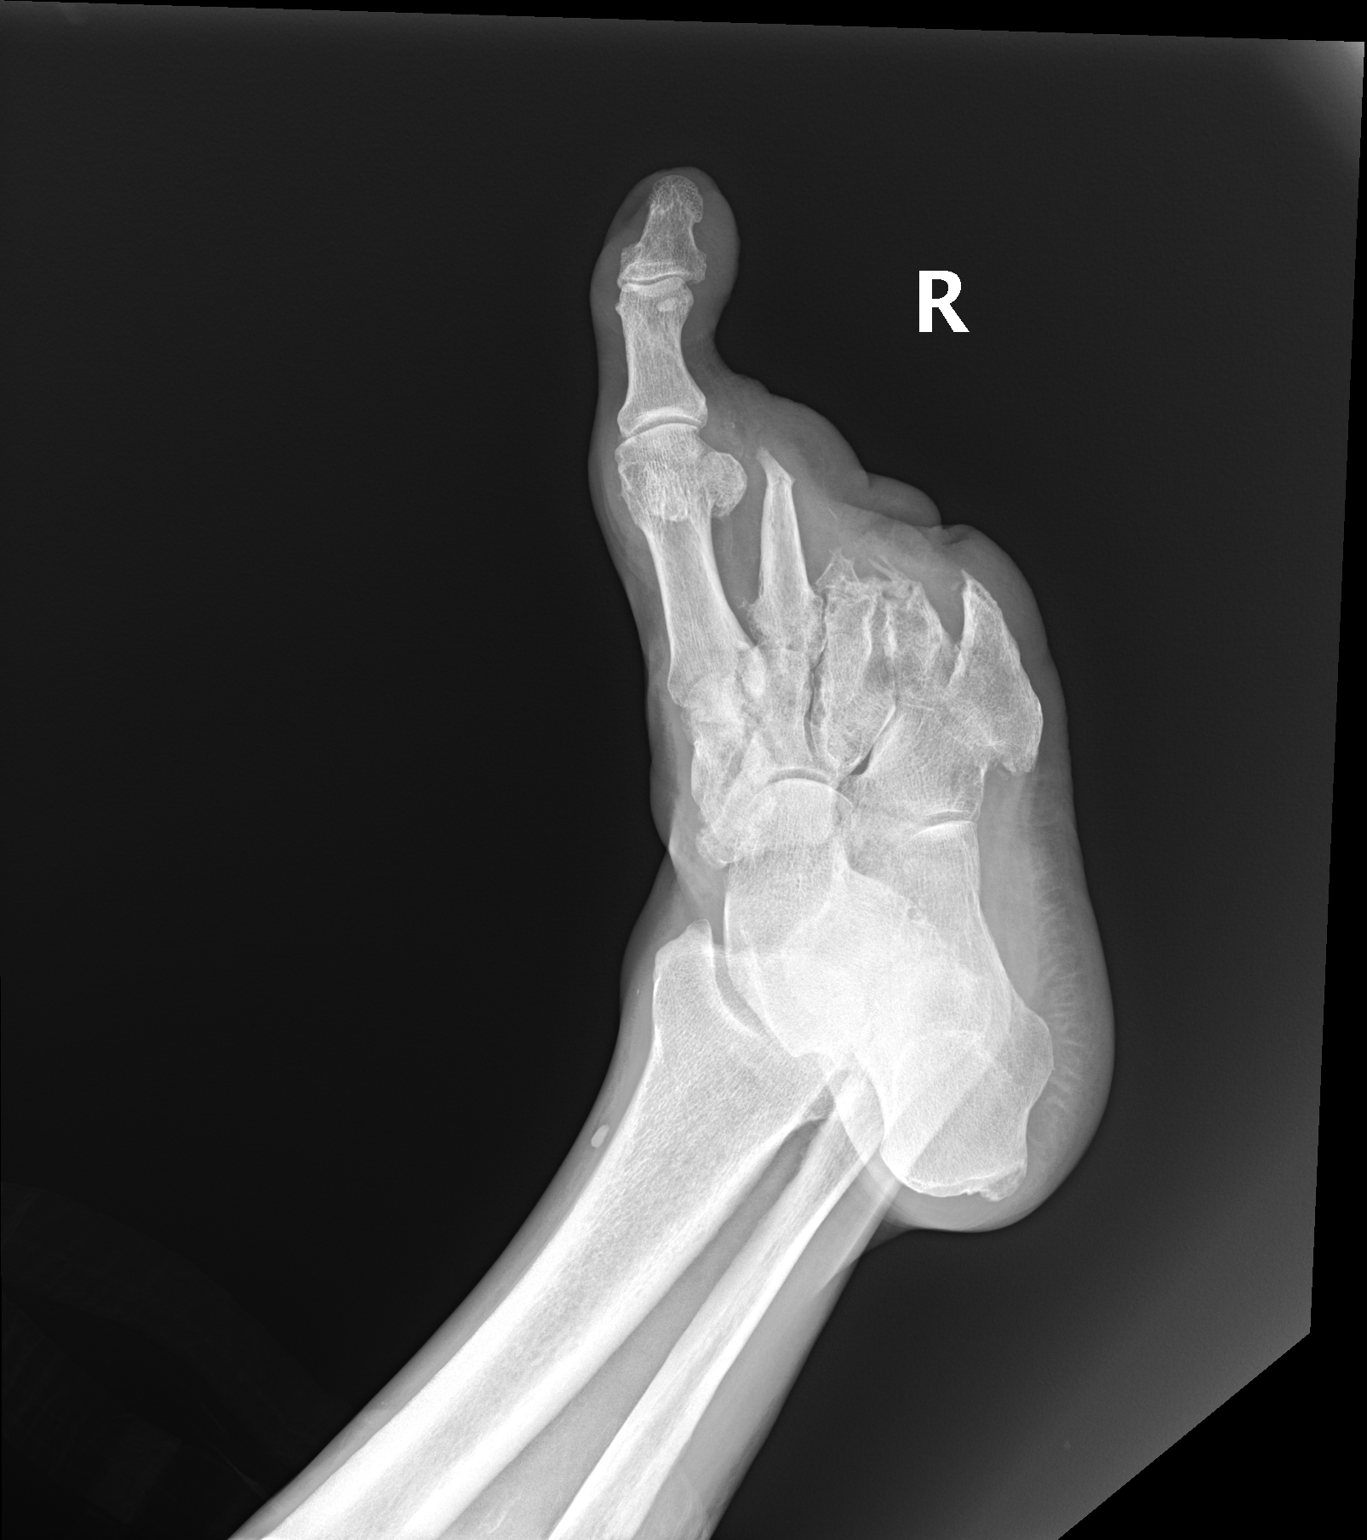
[im 3/3]
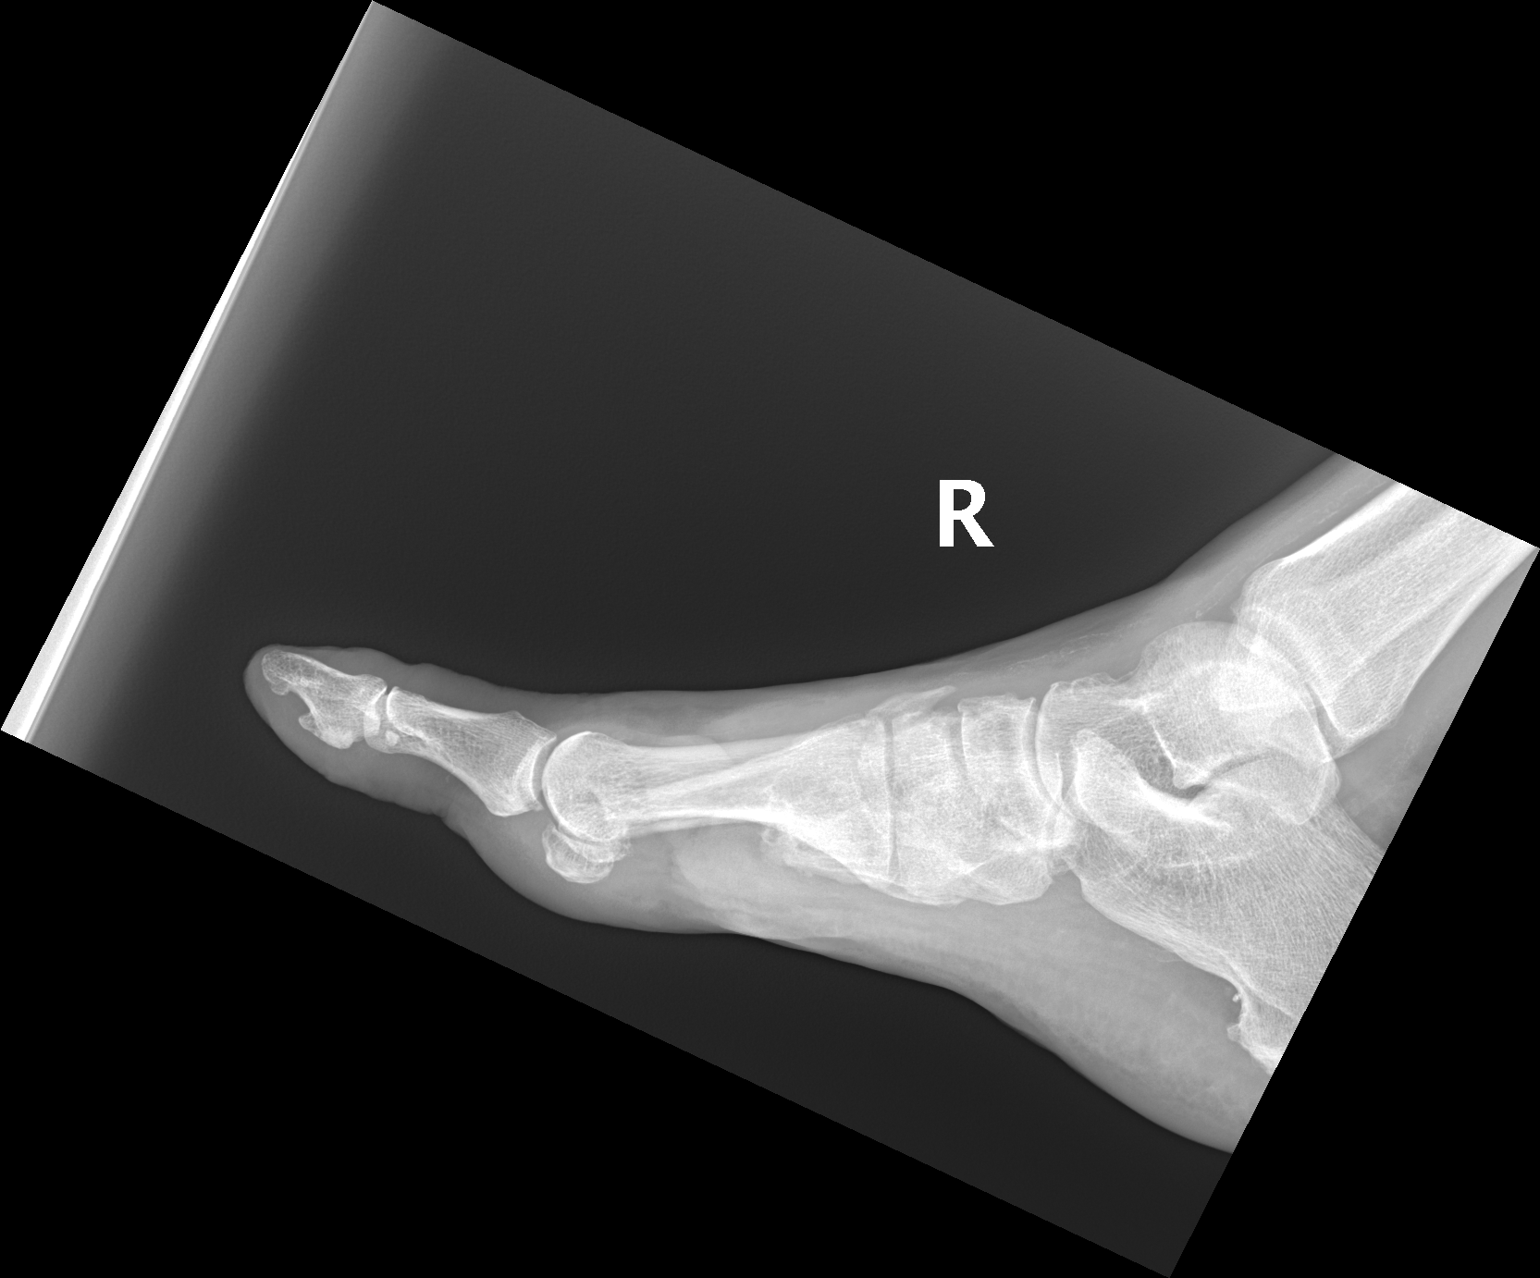

[3 of 3 positions shown; findings below may reference images not displayed]

No bone destruction or periosteal reaction is seen in the first toe. No acute fracture is seen. The joint spaces are maintained.
IMPRESSION: Previous amputations. No acute osseous abnormality.

## 2023-03-18 IMAGING — US US LOW EXT ARTERIES RIGHT
1 series · 13 of 13 positions shown · non-contrast
Comparison: none

FINAL REPORT:
REASON FOR EXAM: Peripheral arterial disease
STUDY PERFORMED: Right lower extremity arterial duplex.

[Series 1: us low ext arteries right · 13 of 13 slices shown]
[im 1/13]
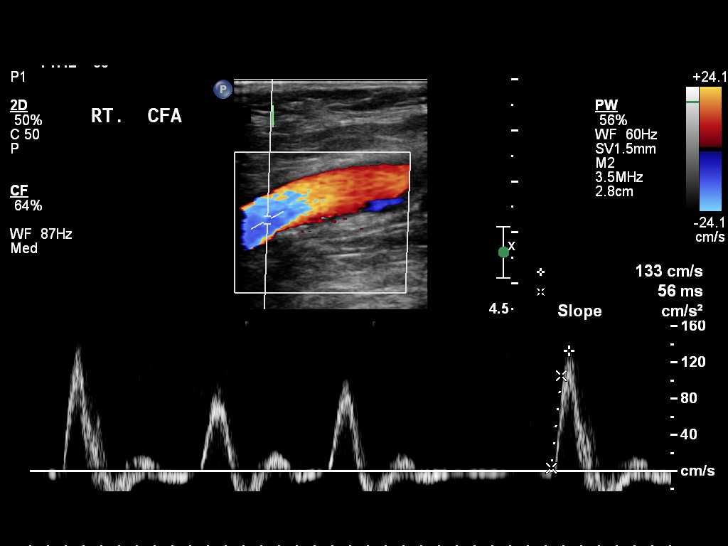
[im 2/13]
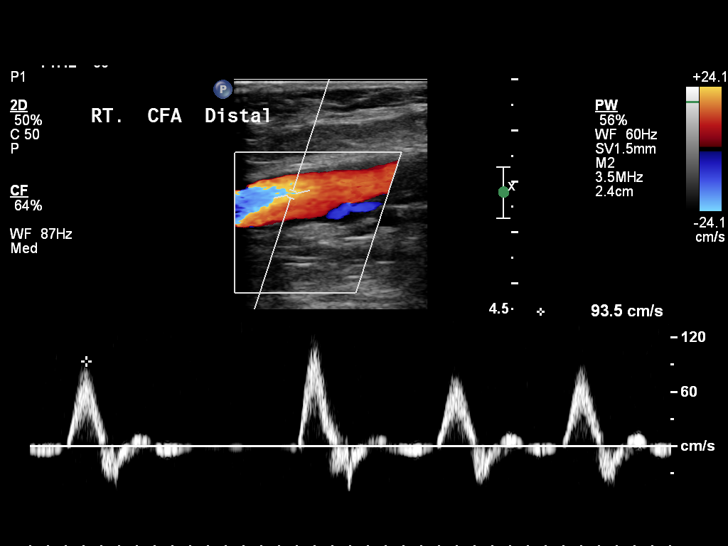
[im 3/13]
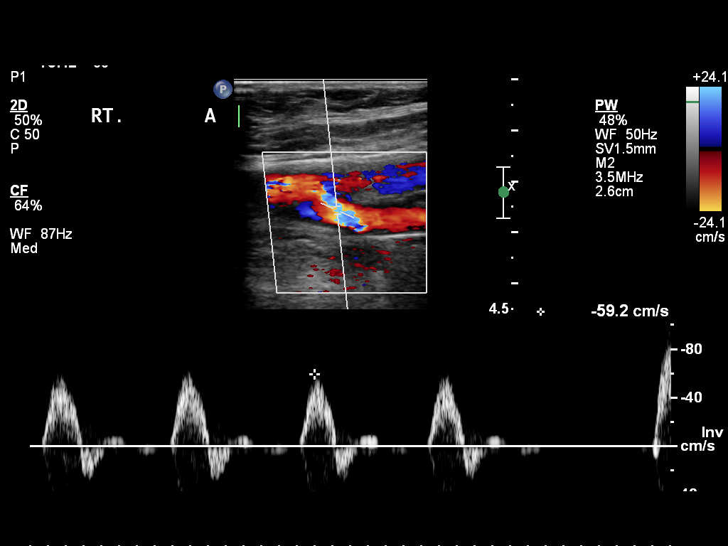
[im 4/13]
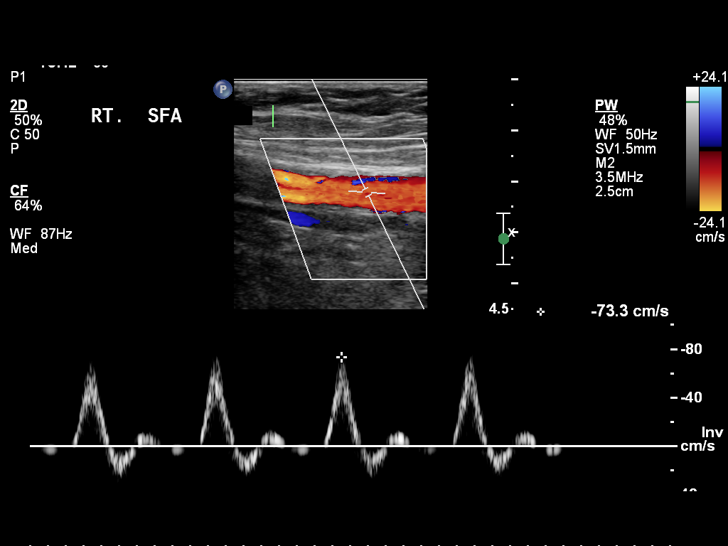
[im 5/13]
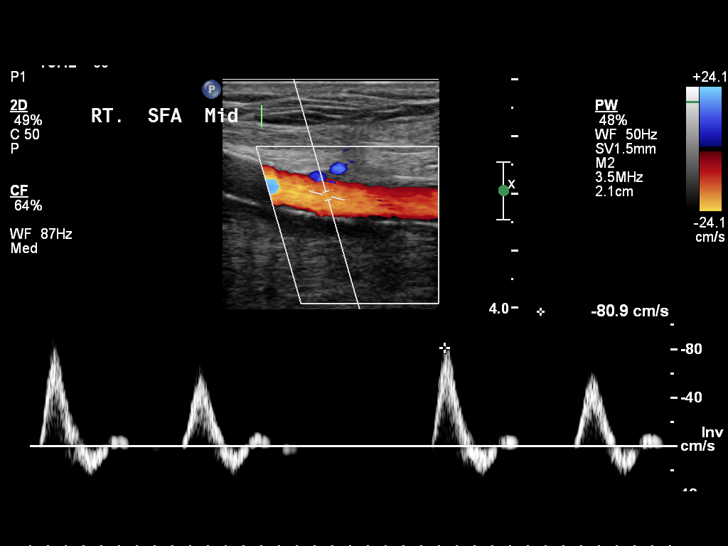
[im 6/13]
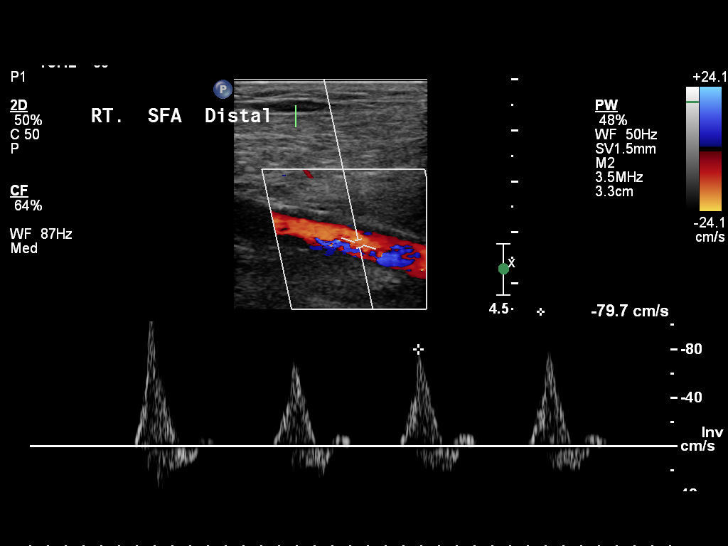
[im 7/13]
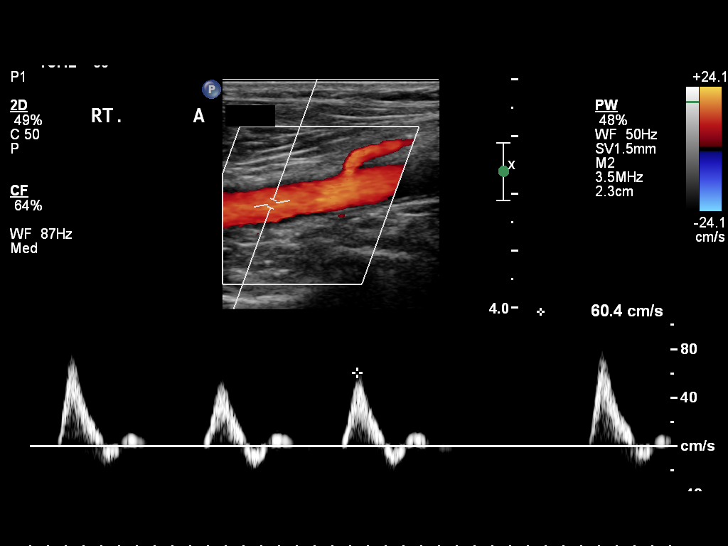
[im 8/13]
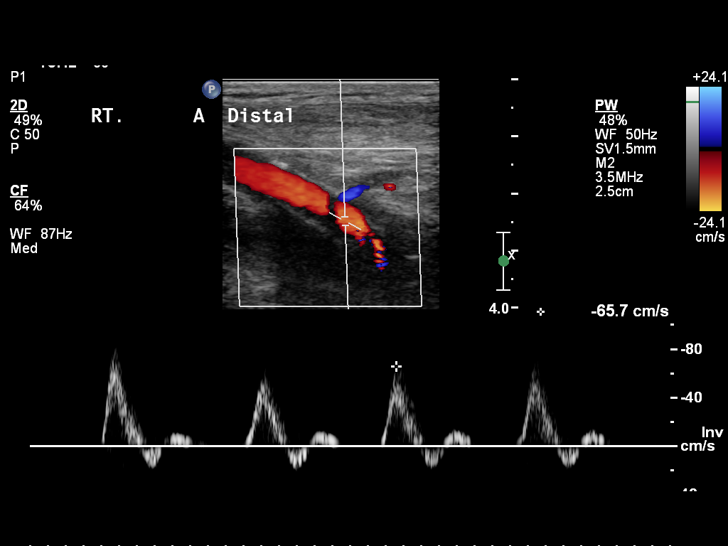
[im 9/13]
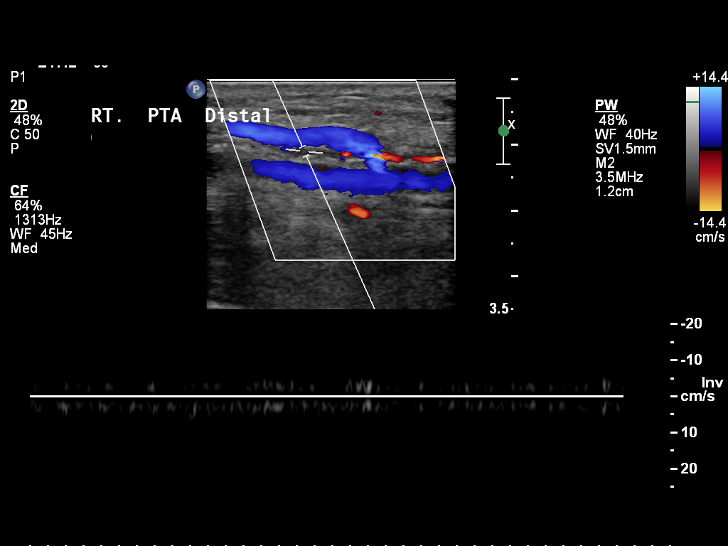
[im 10/13]
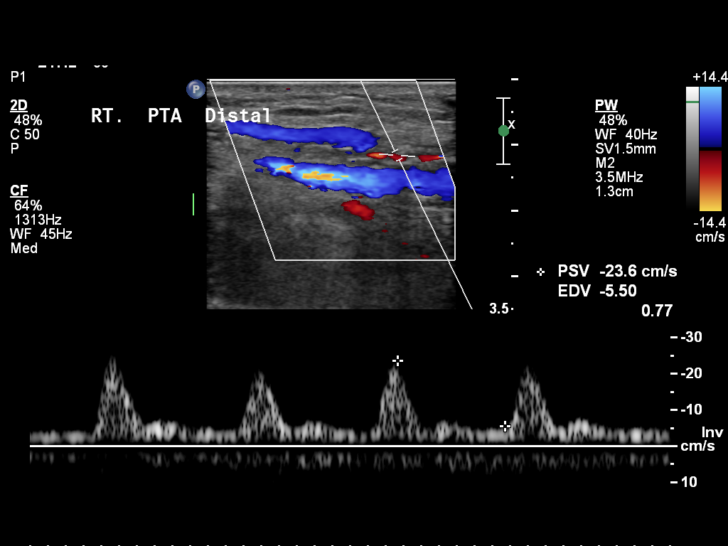
[im 11/13]
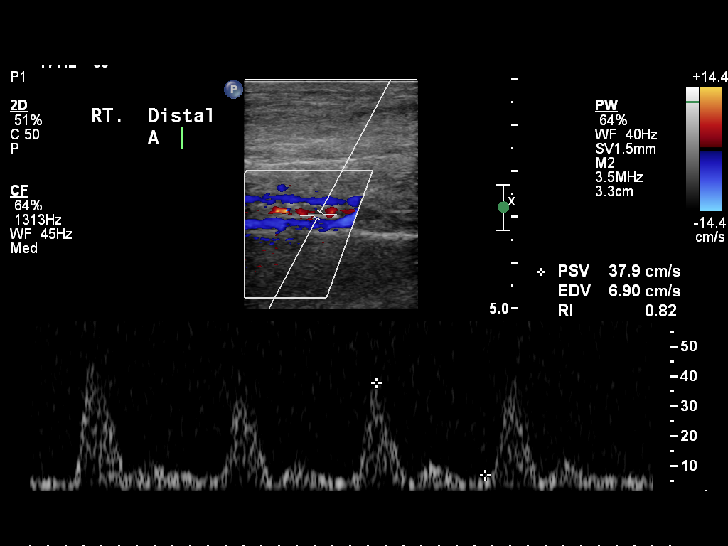
[im 12/13]
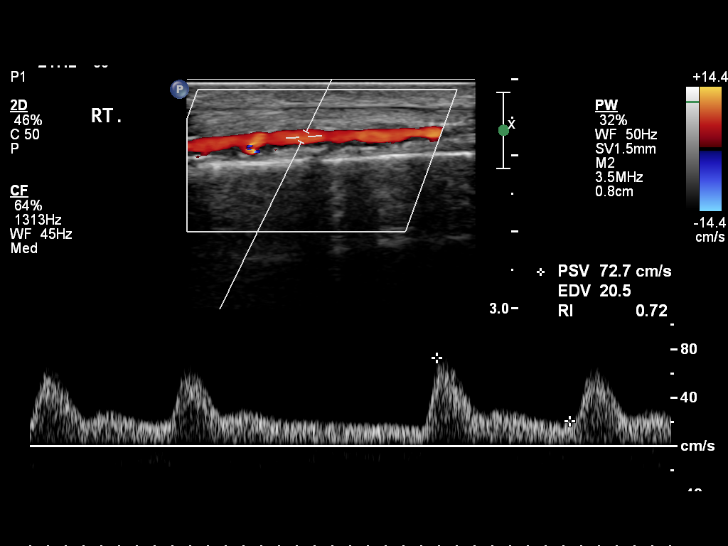
[im 13/13]
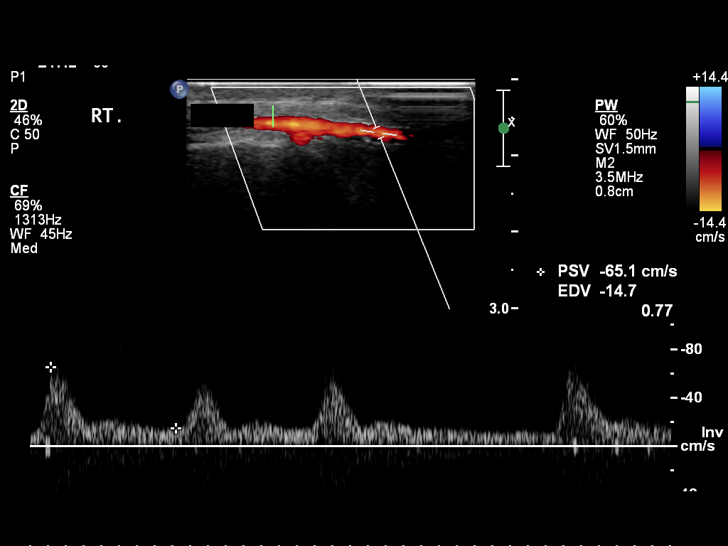

[13 of 13 positions shown; findings below may reference images not displayed]

FINDINGS: Right common femoral artery is patent with normal-appearing biphasic waveform, velocity 133 cm/s. Right profunda femoris artery is patent. Right superficial femoral artery is patent with no focal velocity elevation to suggest stenosis. Right popliteal artery is patent with maintained biphasic waveform, distal velocity 66 cm/s. Primary runoff is through the peroneal and anterior tibial arteries which remain biphasic with forward flow in diastole consistent with vessel wall calcification. The anterior tibial velocity is 73 cm/s dorsalis pedis 65 and peroneal 38 cm/s. The posterior tibial artery is occluded with distal reconstitution.
IMPRESSION: 1. Mild tibial vessel disease on the right side with short posterior tibial artery occlusion with biphasic runoff maintained through the anterior tibial and peroneal arteries.

## 2023-05-05 IMAGING — CR XR FOOT 3+ VIEWS RIGHT
1 series · 3 of 3 positions shown · non-contrast
Comparison: 05/14/2022

Lateral side of foot and at 5th metatarsal swelling and pain/erosive
FINAL REPORT:
XR FOOT 3+ VIEWS RIGHT
INDICATION: Non-pressure chronic ulcer of other part of right foot with fat layer exposed

[Series 4846: AP · right · 0.20mm/px · 3 of 3 slices shown]
[im 1/3]
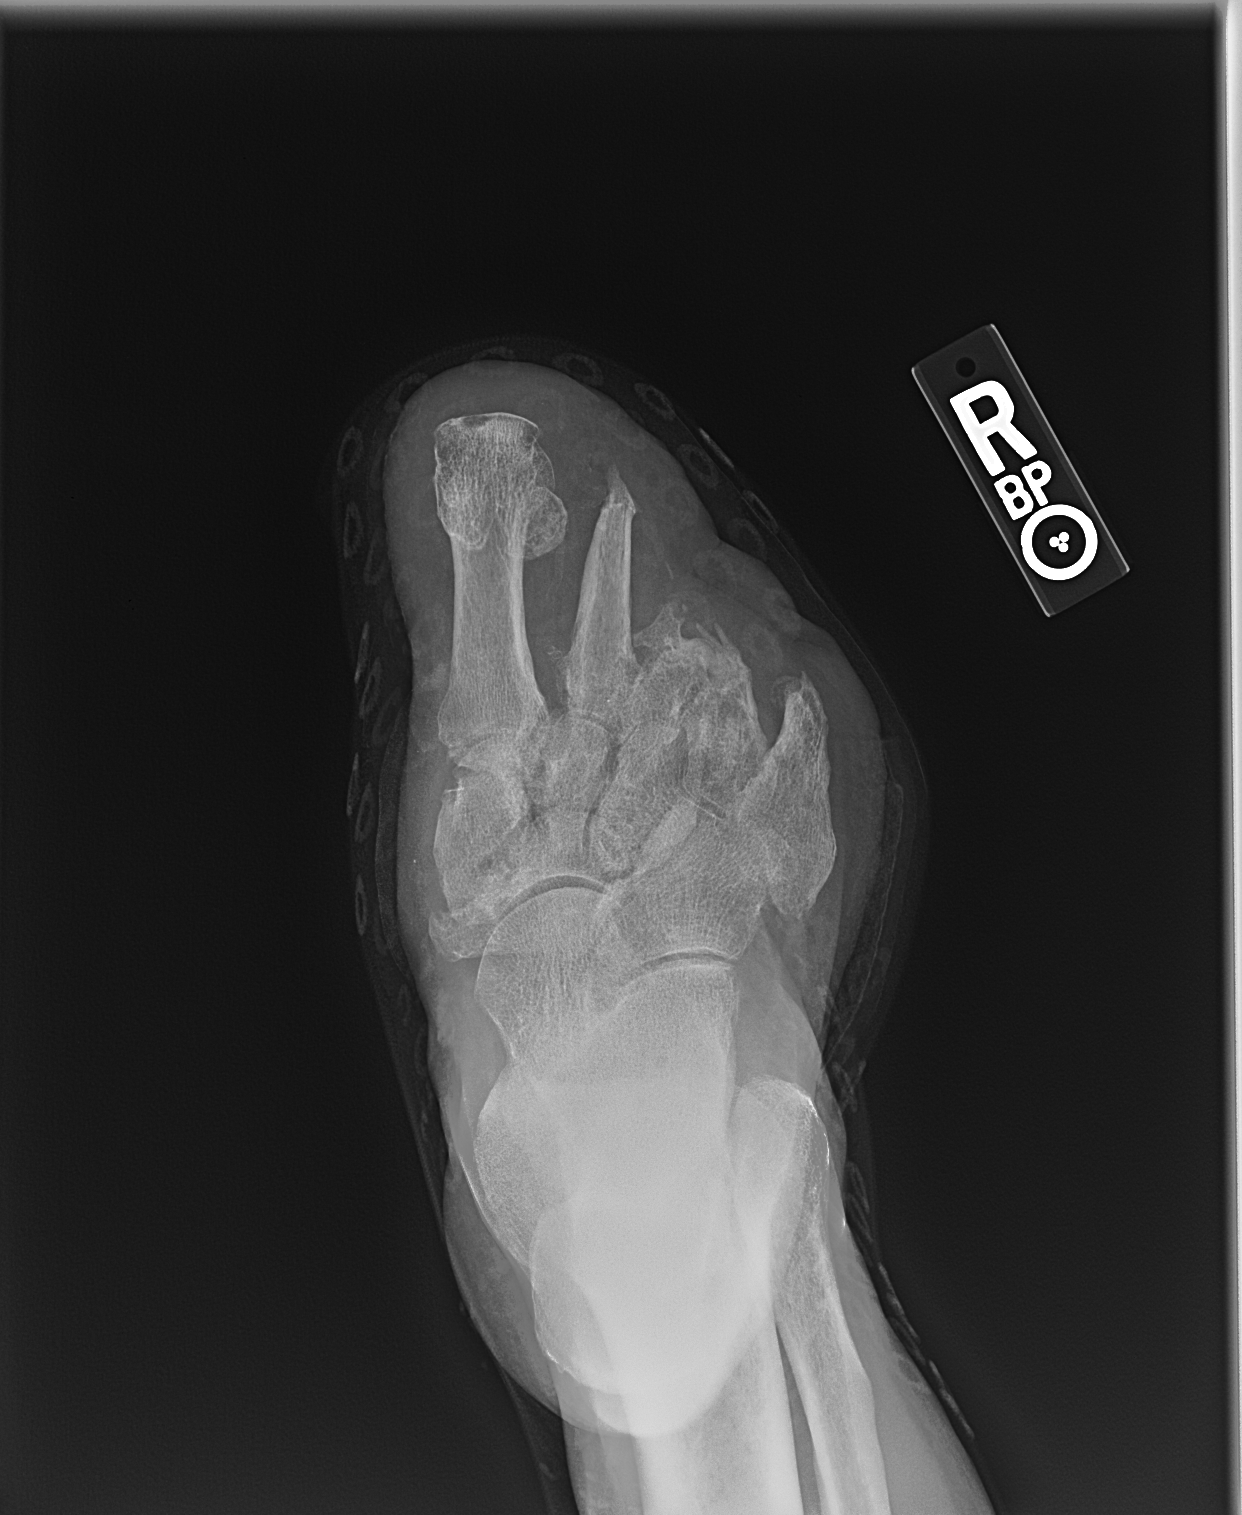
[im 2/3]
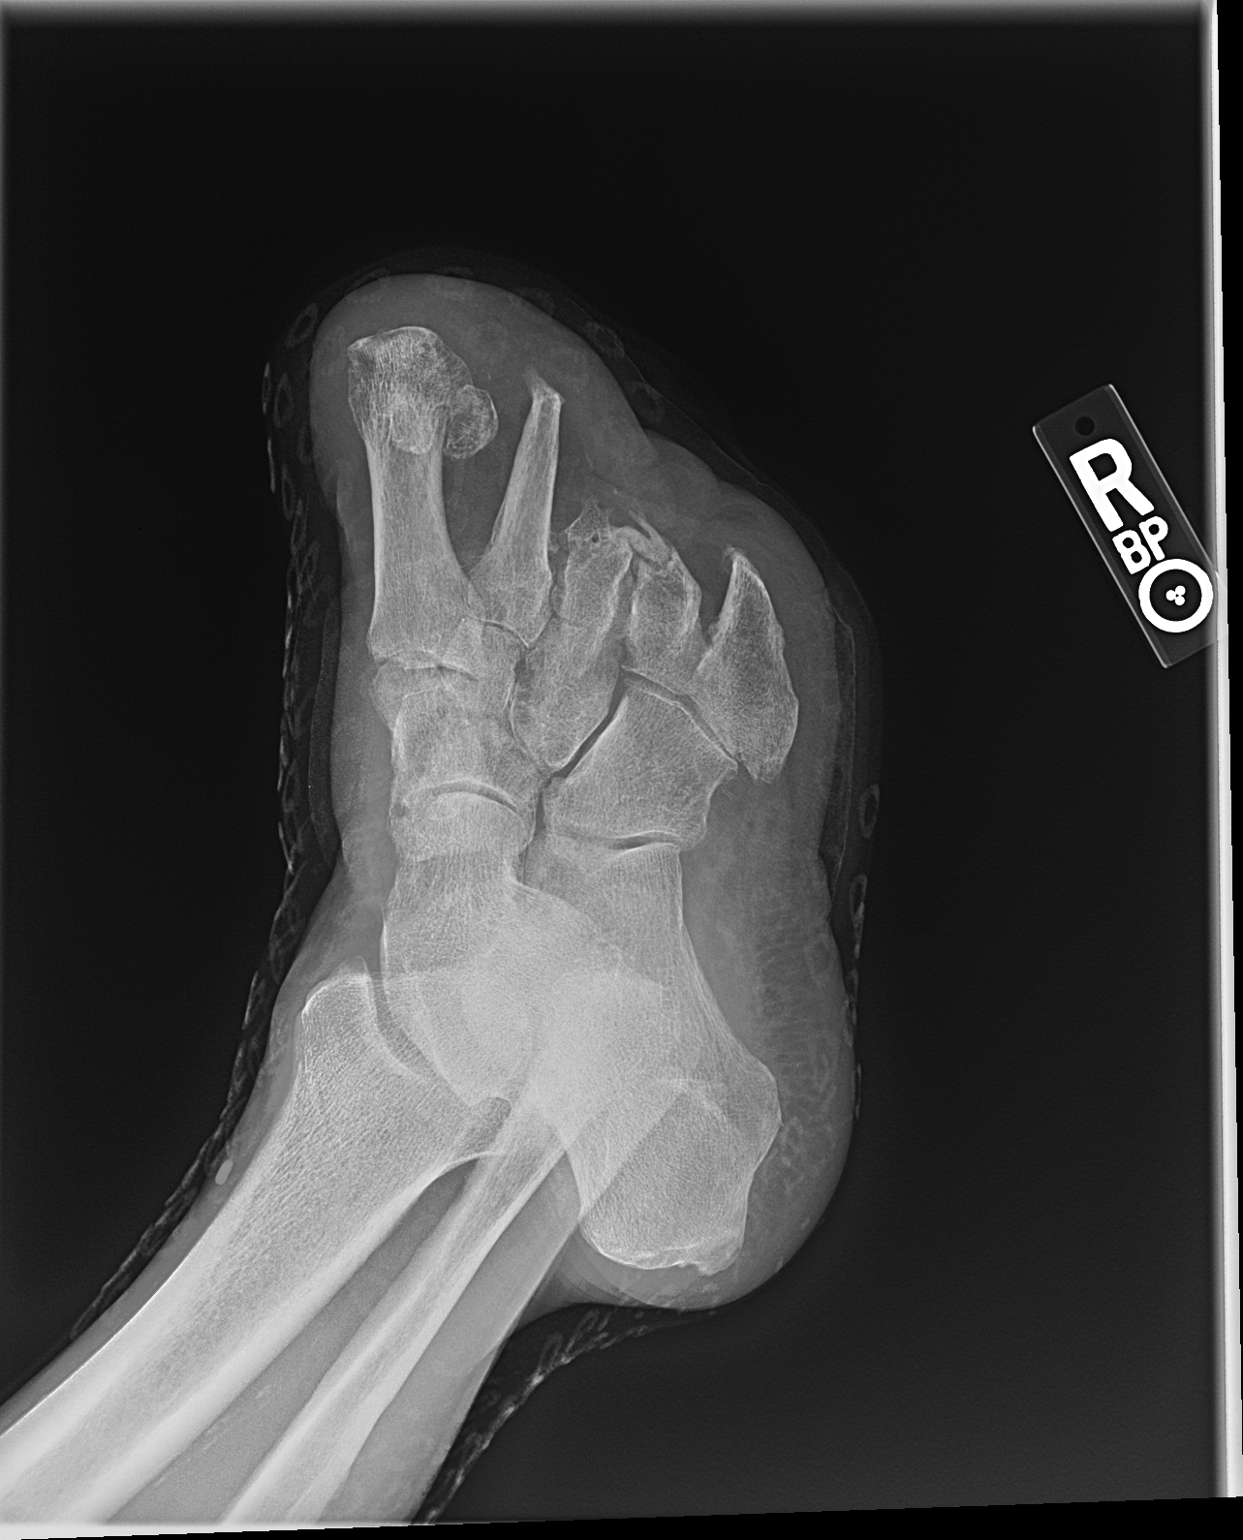
[im 3/3]
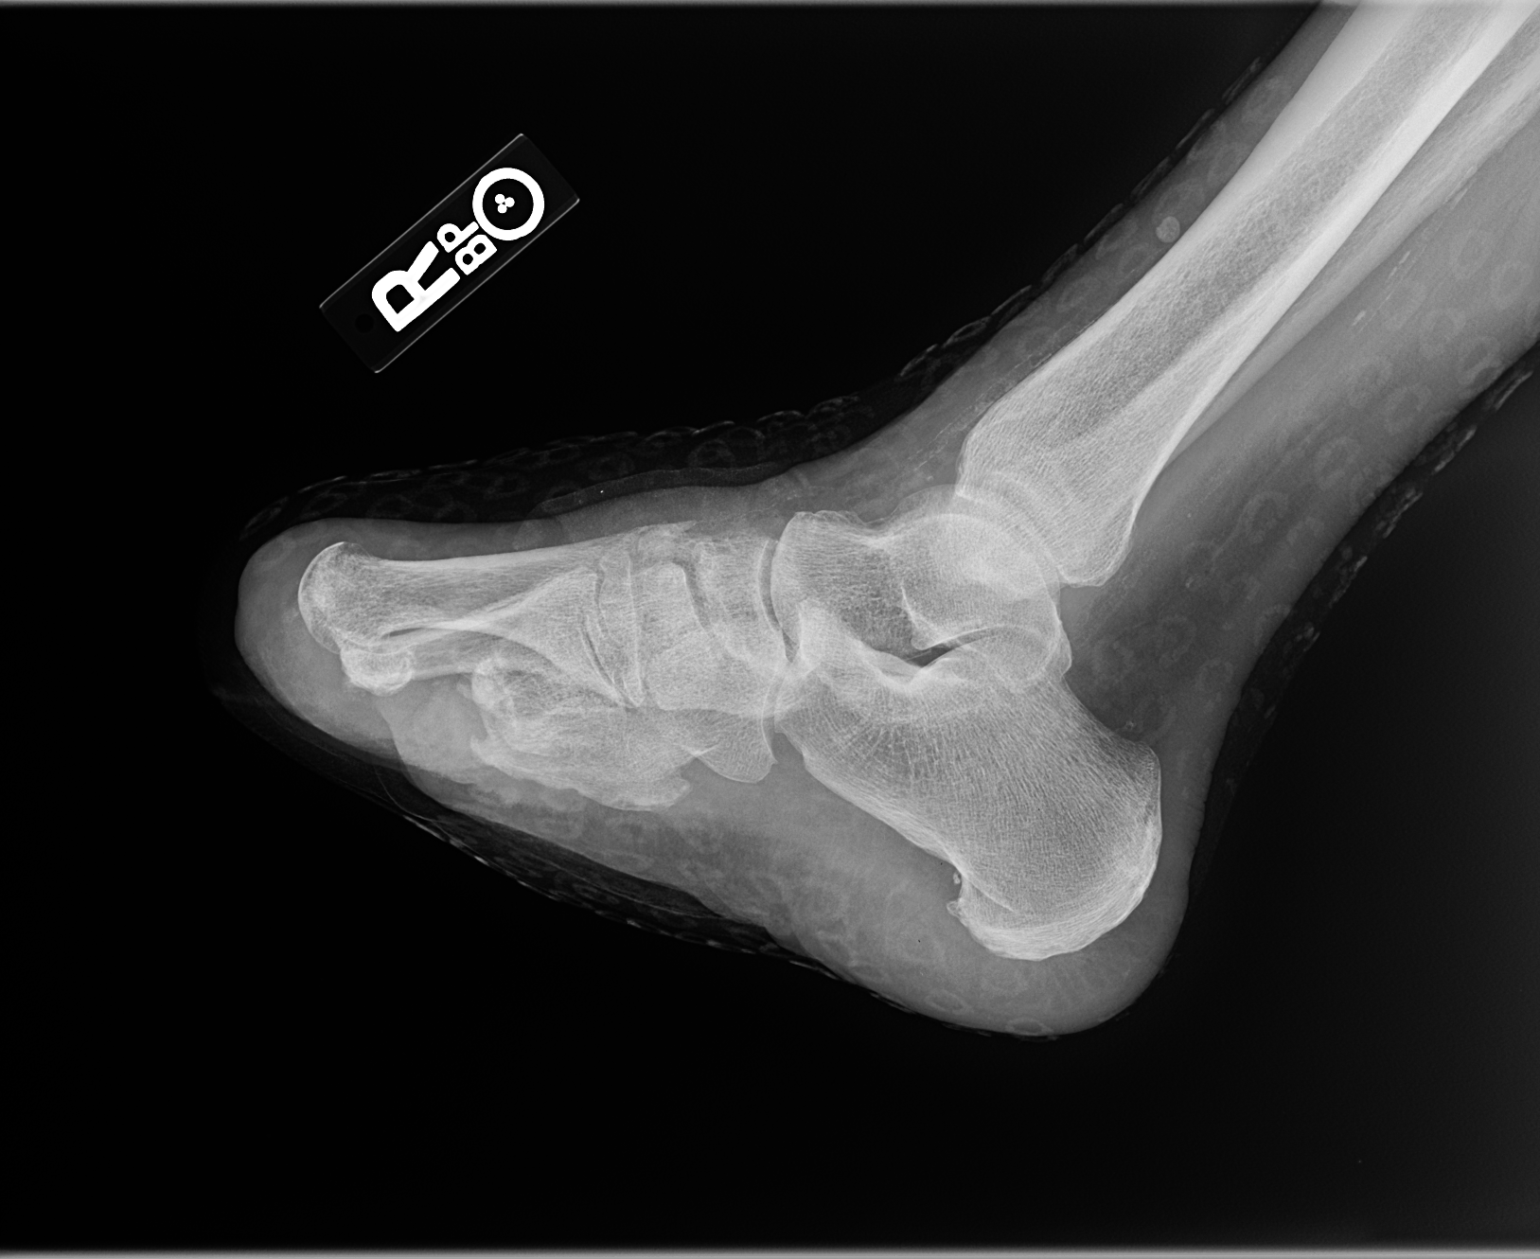

[3 of 3 positions shown; findings below may reference images not displayed]

FINDINGS: 3 views were obtained.
Since the prior study the patient has had resection of the first digit at the metatarsophalangeal joint. The patient has had transmetatarsal resection of the remaining foot in the past. There is bony destruction of the remainder of the third and fourth metatarsals since the prior study. There are new erosions at the medial cuneiform. Degenerative change between the navicular and medial cuneiform is progressed. There is soft tissue swelling over the distal foot. Pressure ulcer is not appreciated on this x-ray correlate with physical examination..
IMPRESSION: Findings of osteomyelitis involving the third and fourth metatarsals and medial cuneiform. This may be confirmed with MRI.

## 2023-05-20 IMAGING — US US CAROTID DOPPLER BILATERAL
1 series · 13 of 24 positions shown · non-contrast
Comparison: none

FINAL REPORT:
REASON FOR EXAM: Carotid Artery Disease
STUDY PERFORMED: Bilateral carotid artery duplex.

[Series 1: us carotid doppler bilateral · 13 of 34 slices shown]
[im 1/34]
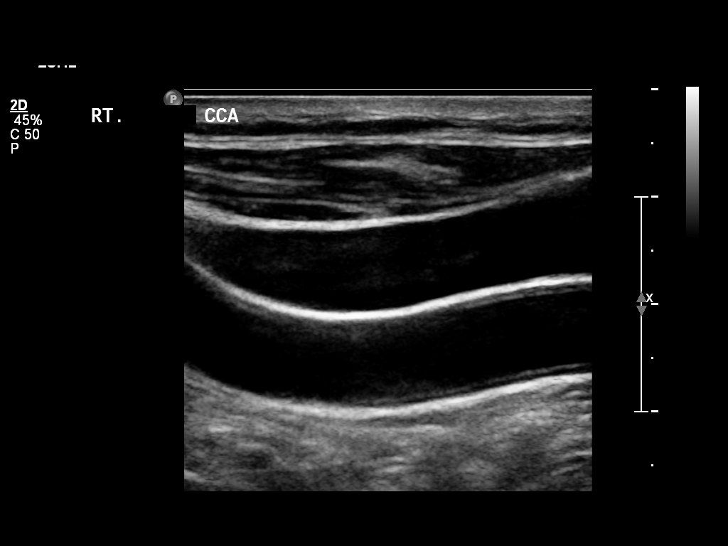
[im 3/34]
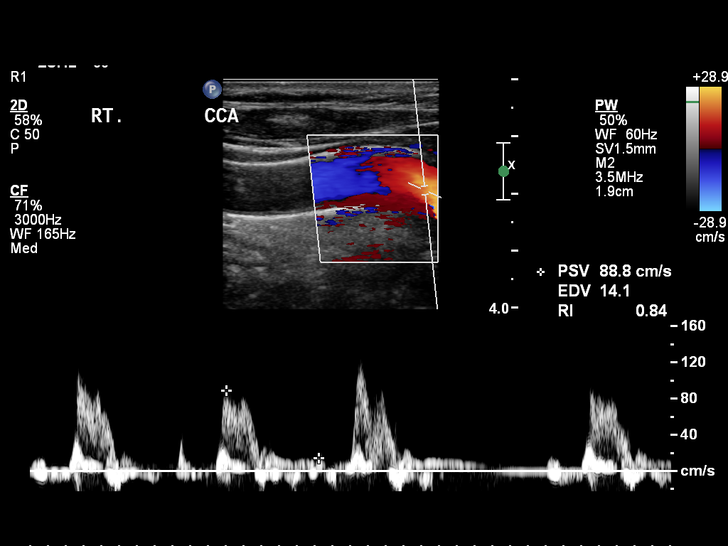
[im 6/34]
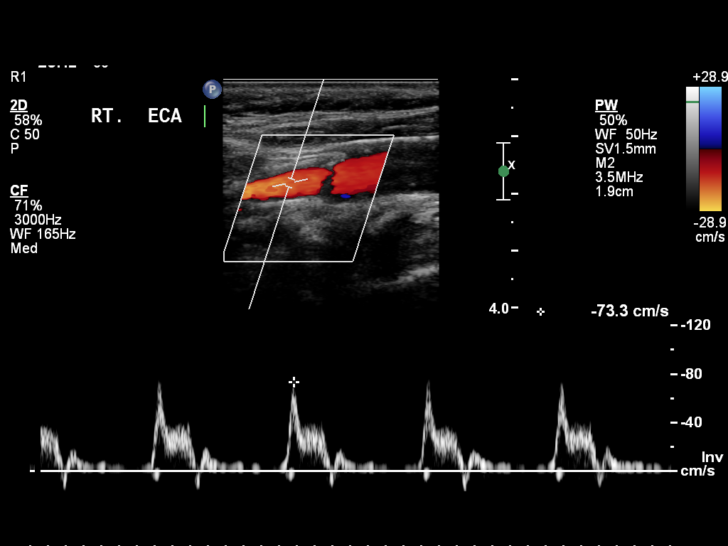
[im 9/34]
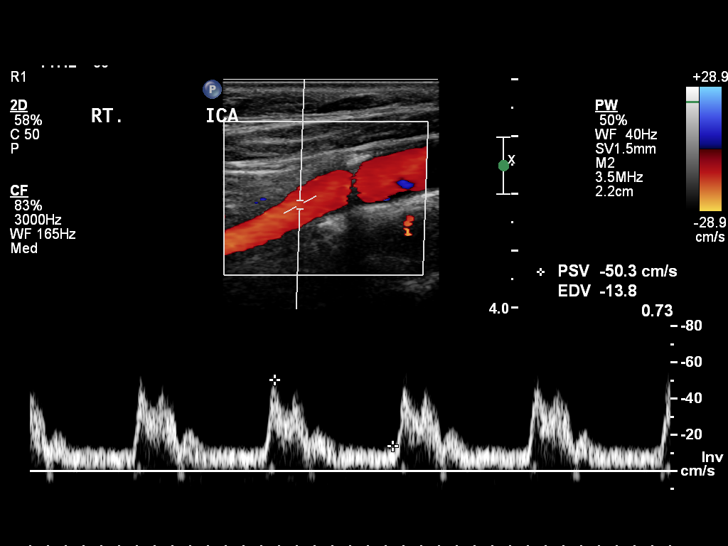
[im 12/34]
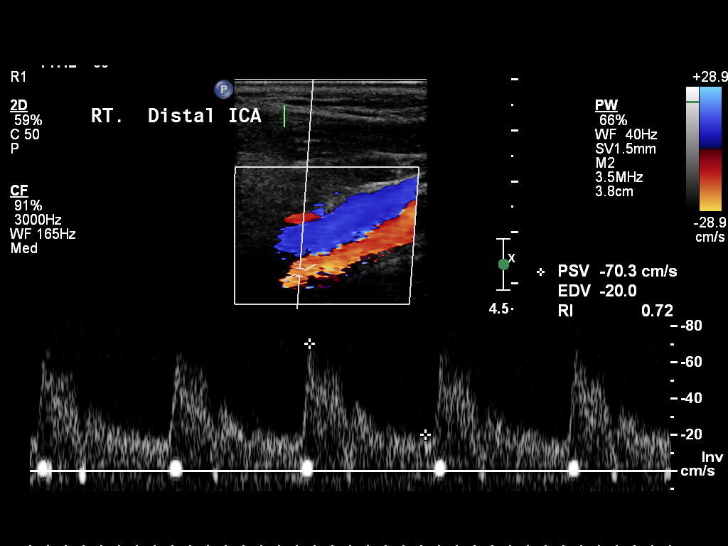
[im 15/34]
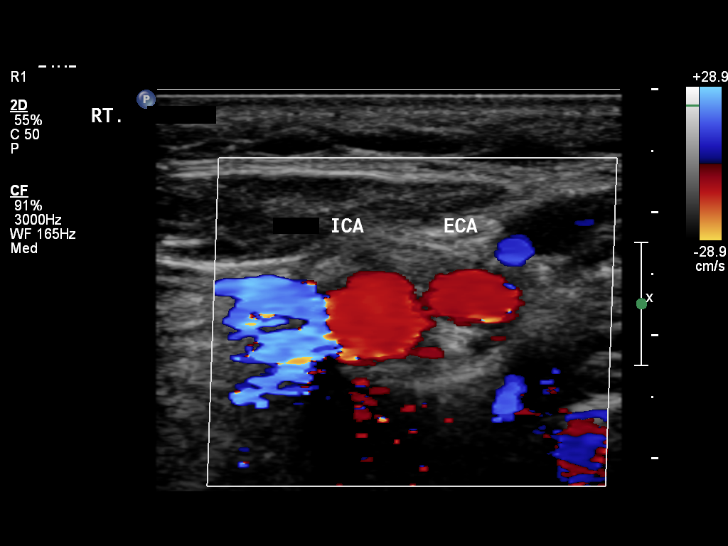
[im 18/34]
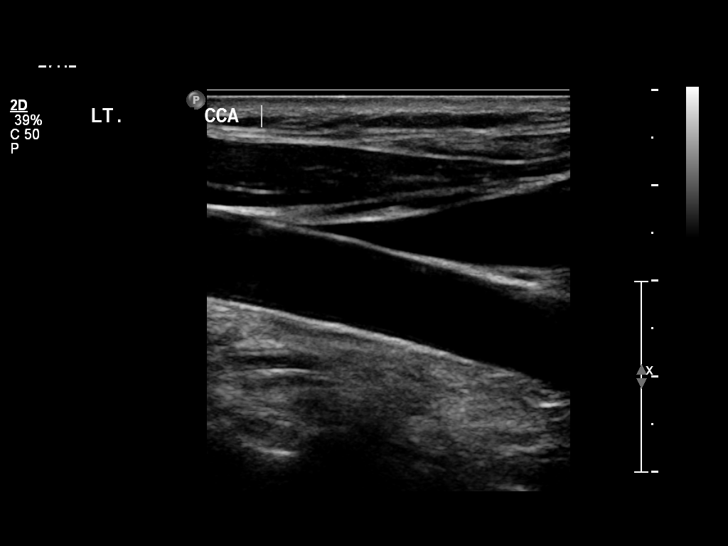
[im 19/34]
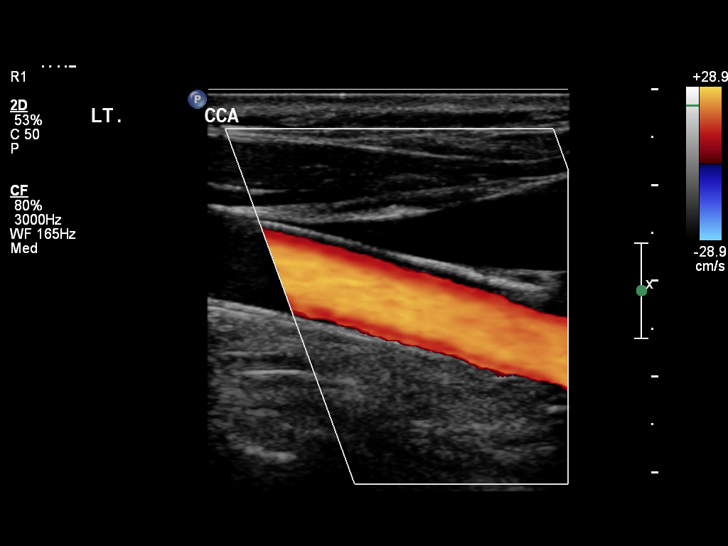
[im 22/34]
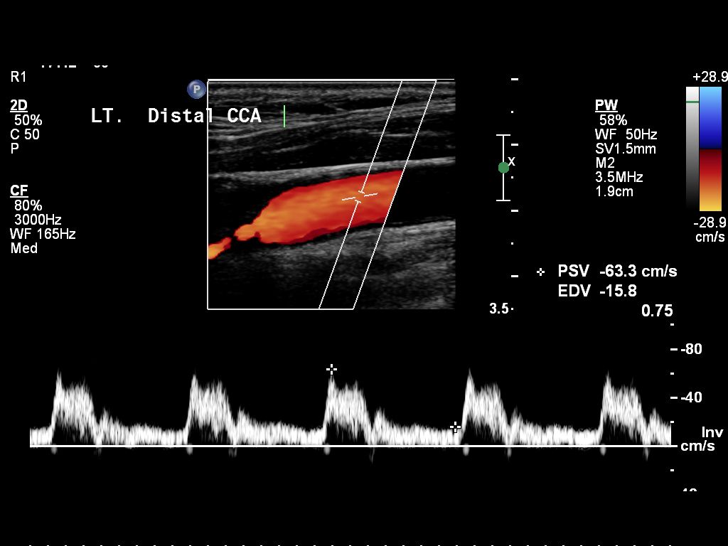
[im 25/34]
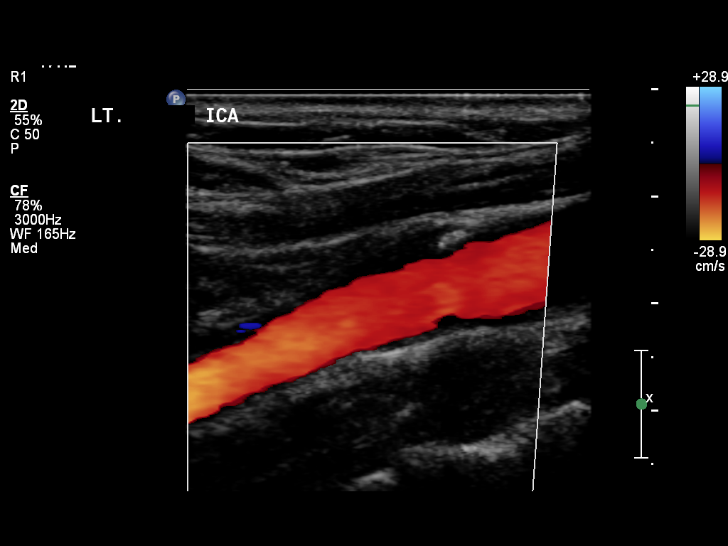
[im 28/34]
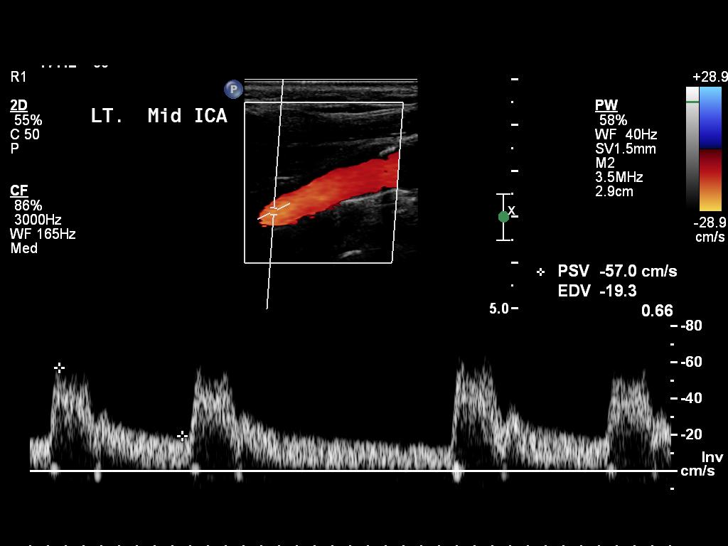
[im 31/34]
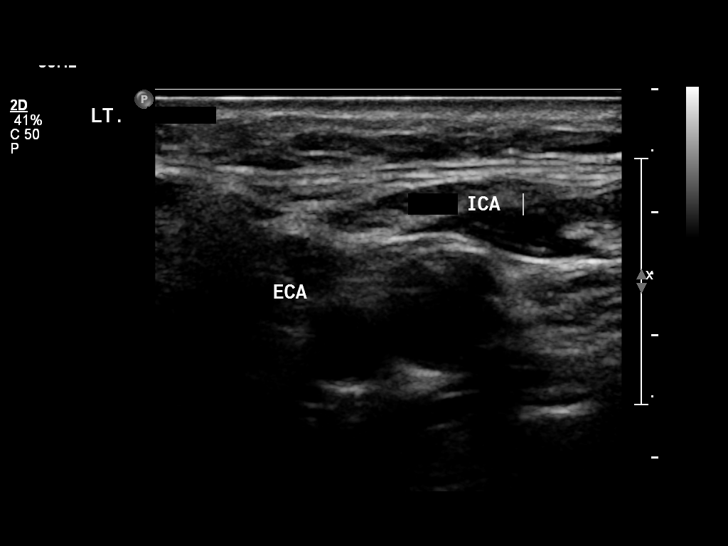
[im 34/34]
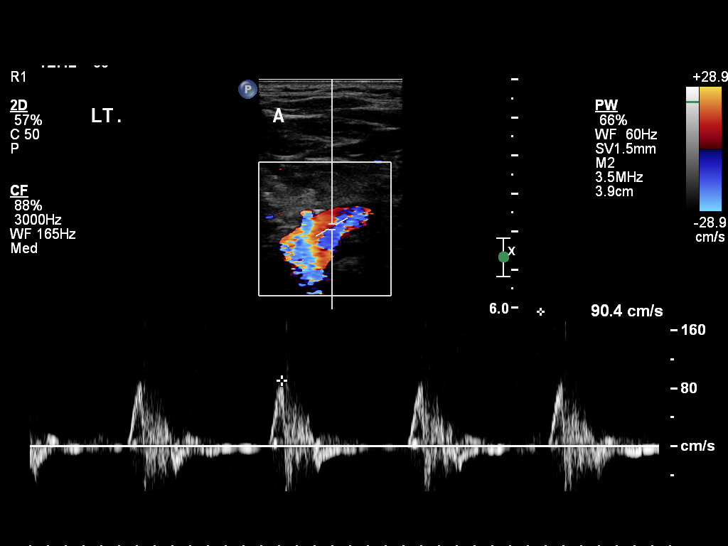

[13 of 24 positions shown; findings below may reference images not displayed]

FINDINGS: RIGHT:
The right common and internal carotid artery demonstrate minimal plaque on grayscale imaging. Duplex imaging shows mild spectral broadening. The common carotid artery demonstrates velocities of 52 cm/s. The external carotid artery demonstrates velocities of 73 cm/s. The maximal internal carotid artery velocities are 70/20 cm/s with an ICA:CCA ratio of 1.3. The vertebral artery demonstrates antegrade flow and velocities of 39 cm/s. The subclavian artery demonstrates monophasic waveforms and velocities of 294 cm/s.
LEFT:
The left common and internal carotid artery demonstrate mild intimal thickening on grayscale imaging. Duplex imaging shows mild spectral broadening. The common carotid artery demonstrates velocities of 63 cm/s. The external carotid artery demonstrates velocities of 75 cm/s. The maximal internal carotid artery velocities are 77/25 cm/s with an ICA:CCA ratio of 1.2. The vertebral artery demonstrates antegrade flow and velocities of 63 cm/s. The subclavian artery demonstrates triphasic waveforms and velocities of 90 cm/s.
IMPRESSION: 1.  The right internal carotid demonstrates less than 50% stenosis based on diagnostic criteria. The right vertebral artery flow is antegrade although somewhat diminished velocities. Likely at least moderate stenosis noted within the right subclavian artery.
2.  The left internal carotid demonstrates less than 50% stenosis based on diagnostic criteria. The left vertebral artery flow is antegrade.

## 2023-05-20 IMAGING — US US LOW EXT ARTERIES RIGHT
1 series · 13 of 15 positions shown · non-contrast
Comparison: none

FINAL REPORT:
REASON FOR EXAM: Peripheral arterial disease
STUDY PERFORMED: Right lower extremity arterial duplex.

[Series 1: us low ext arteries right · 13 of 15 slices shown]
[im 1/15]
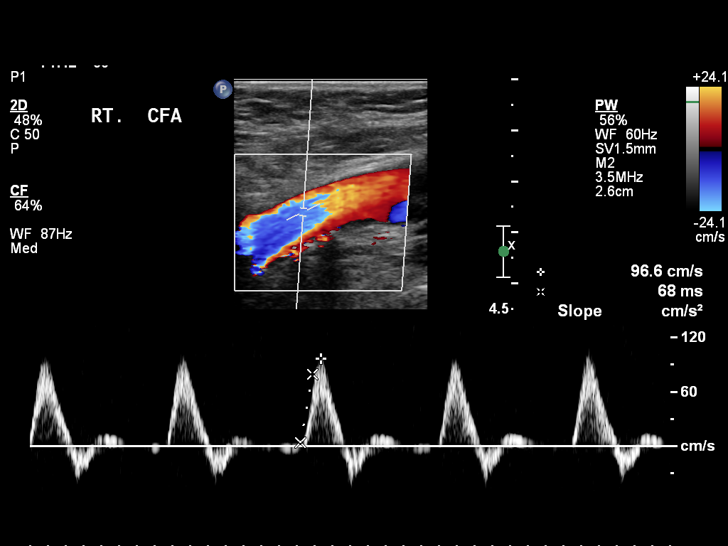
[im 2/15]
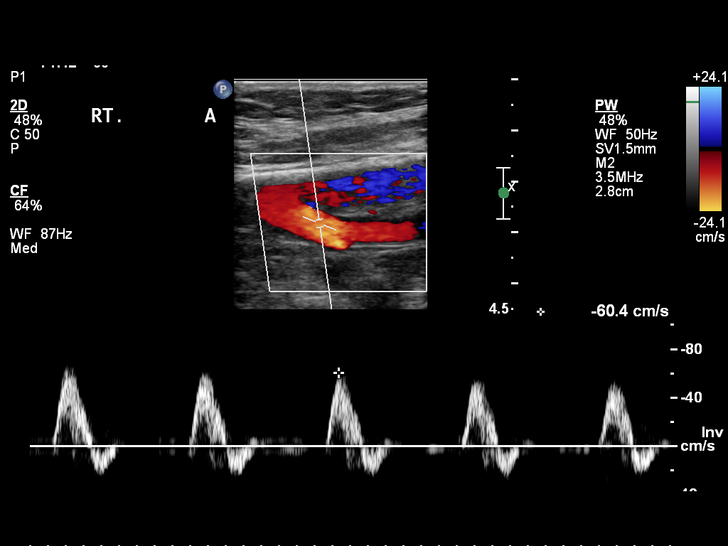
[im 3/15]
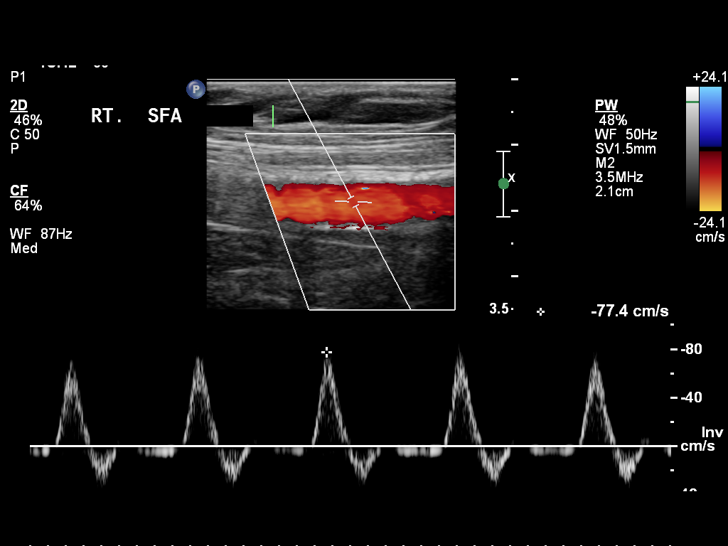
[im 5/15]
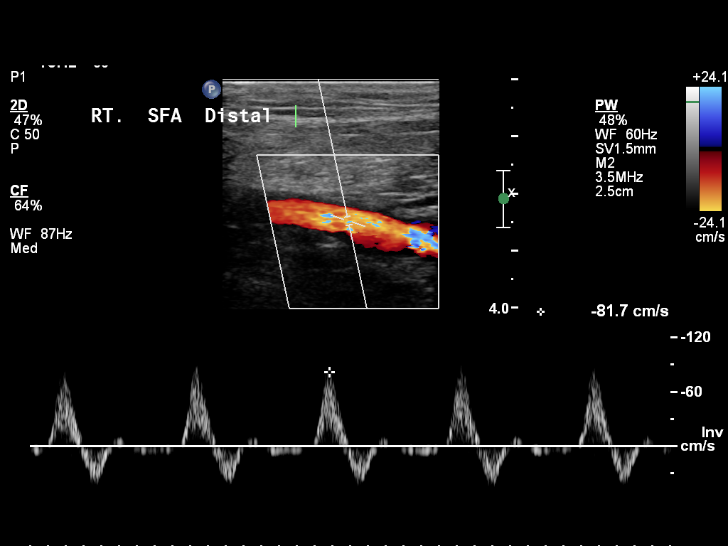
[im 6/15]
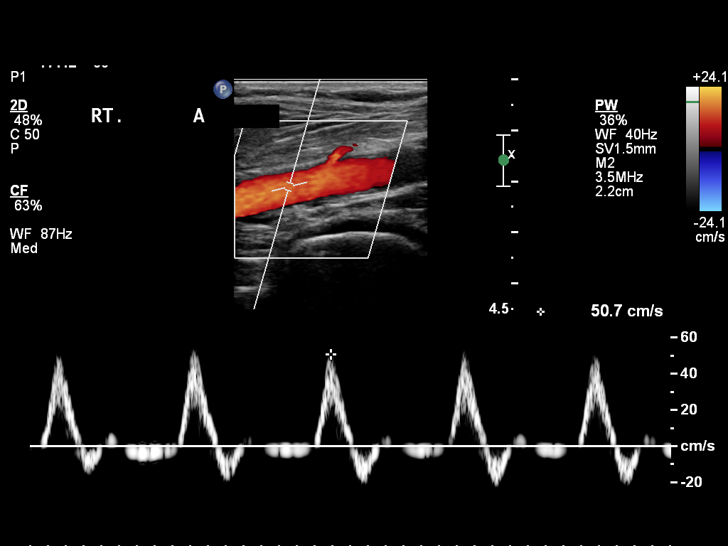
[im 7/15]
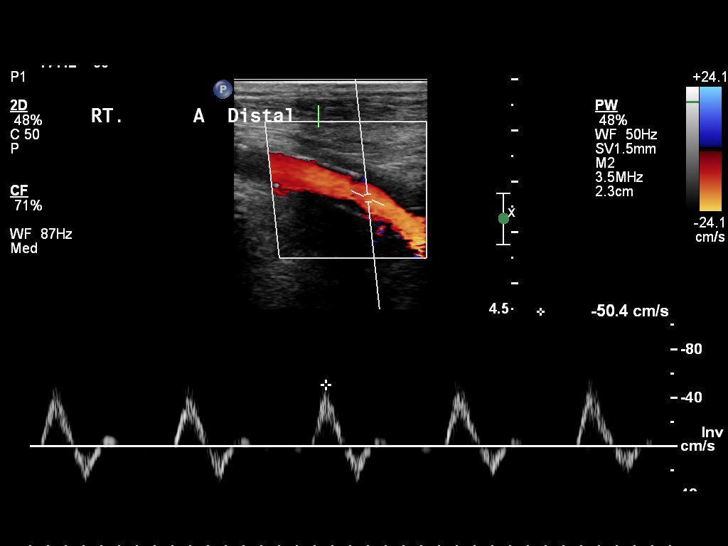
[im 8/15]
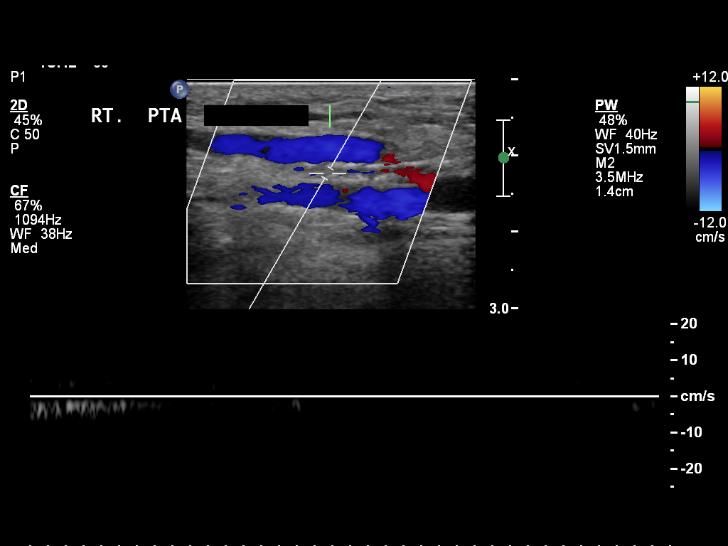
[im 9/15]
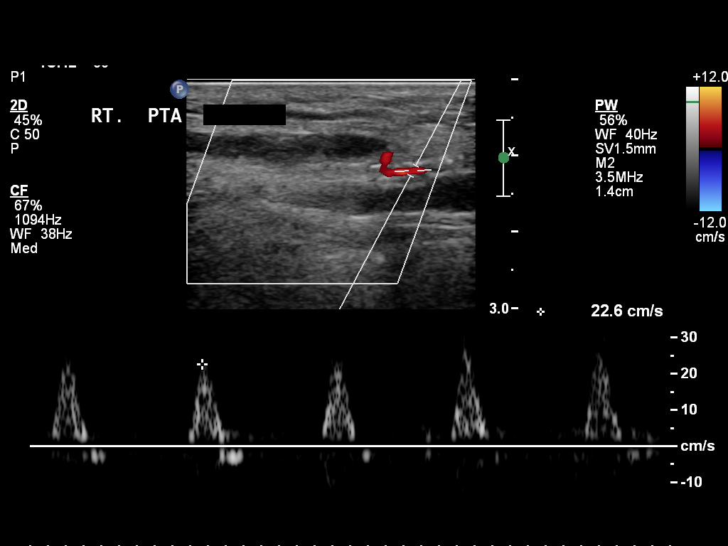
[im 10/15]
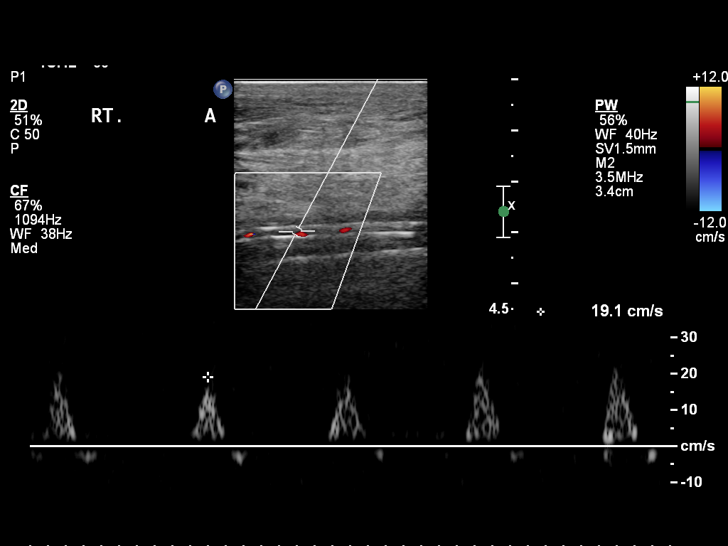
[im 11/15]
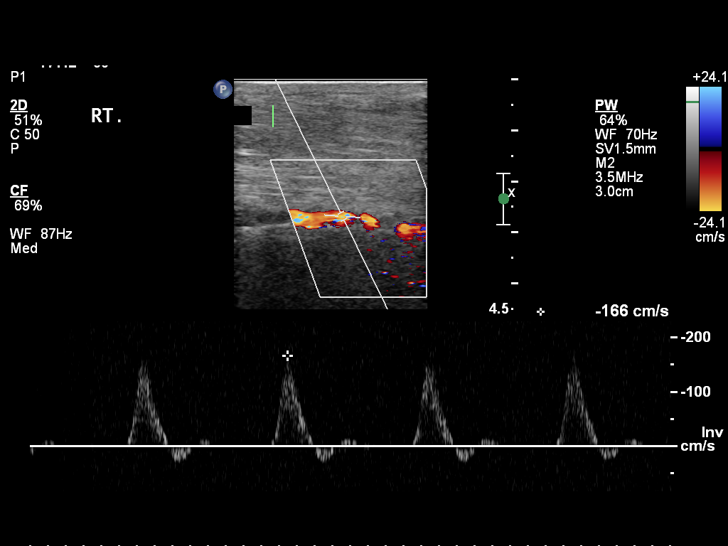
[im 13/15]
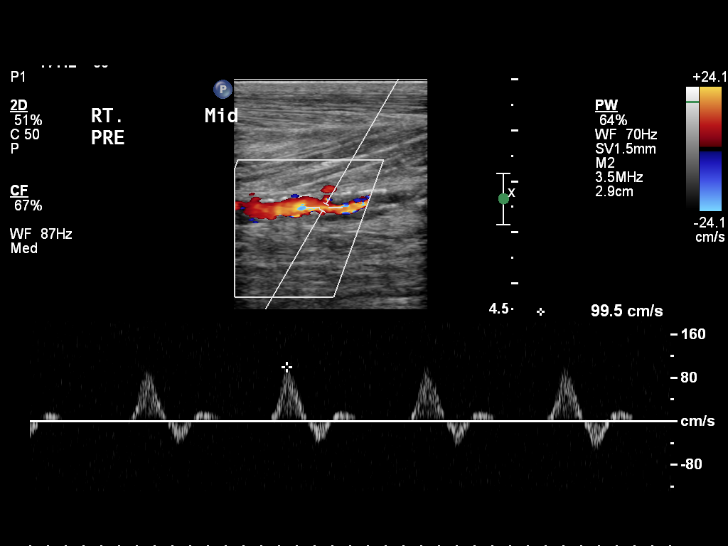
[im 14/15]
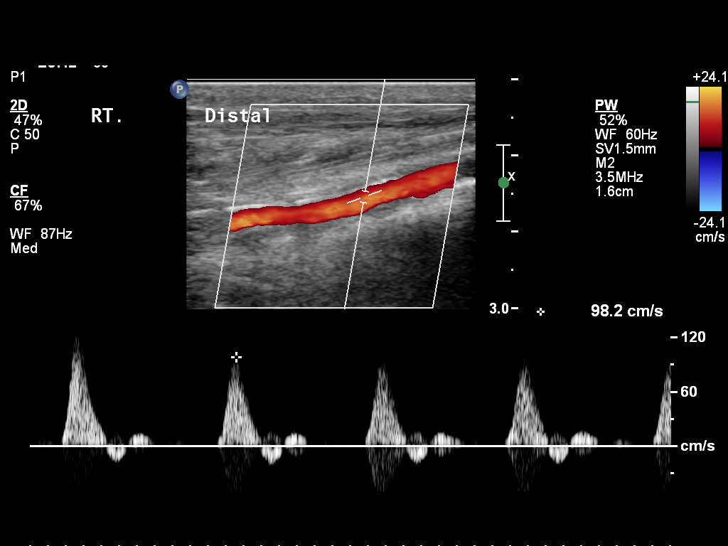
[im 15/15]
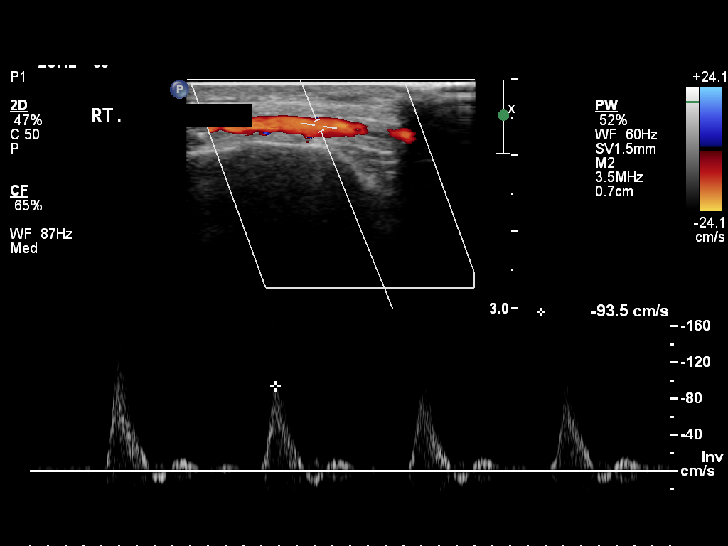

[13 of 15 positions shown; findings below may reference images not displayed]

FINDINGS: RIGHT:
Right common femoral artery is patent with triphasic waveforms. The common femoral artery demonstrates velocities of 97 cm/s. The profunda femoris artery demonstrates velocities of 60 cm/s. The right superficial femoral artery demonstrates triphasic waveforms throughout with velocities proximally of 77 cm/s, mid 60 cm/s and distally 82 cm/s. The proximal popliteal artery demonstrates triphasic waveforms and velocities of 51 cm/s and distally velocities of 50 cm/s. The posterior tibial artery is occluded proximally with reconstituted flow and velocities of 23 cm/s. The peroneal artery demonstrates velocities and 19 cm/s. The anterior tibial artery proximally demonstrates triphasic waveforms and velocities of 166 cm/s, mid 202 cm/s and distally 98 cm/s with maintained triphasic waveforms. The dorsalis pedis demonstrates triphasic waveforms and velocities of 94 cm/s..
IMPRESSION: 1. The right posterior tibial artery is occluded proximally with what appears to be reconstituted flow distally. The peroneal artery demonstrates diminished velocities. The anterior tibial artery is the dominant runoff to the foot with elevated velocities in the mid segment although velocities and waveforms are maintained distal to this.

## 2023-08-20 IMAGING — US US LOW EXT ARTERIES RIGHT
1 series · 13 of 16 positions shown · non-contrast
Comparison: none

FINAL REPORT:
REASON FOR EXAM: Peripheral arterial disease
STUDY PERFORMED: Right lower extremity arterial duplex.

[Series 1: us low ext arteries right · 13 of 34 slices shown]
[im 1/34]
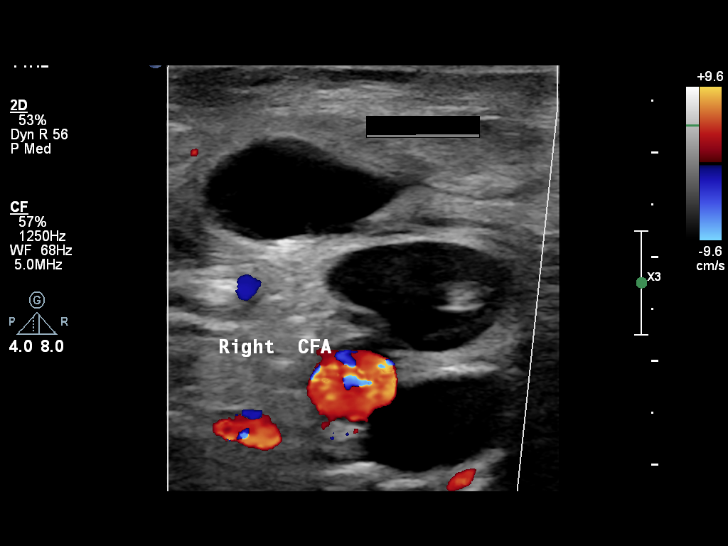
[im 3/34]
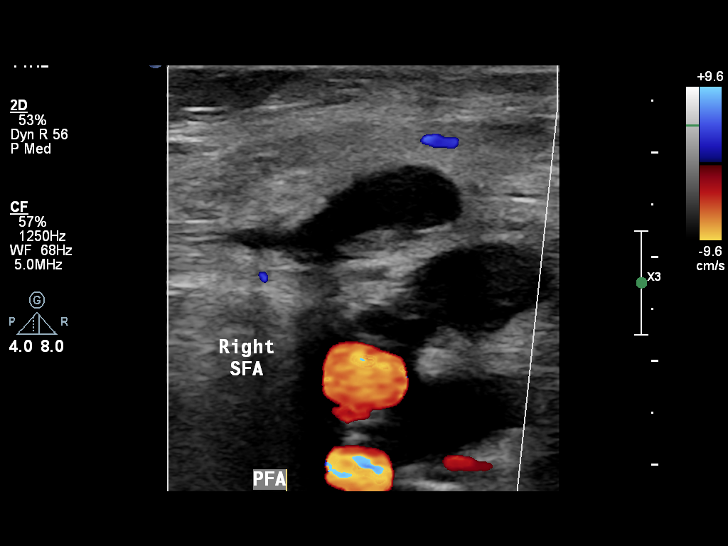
[im 7/34]
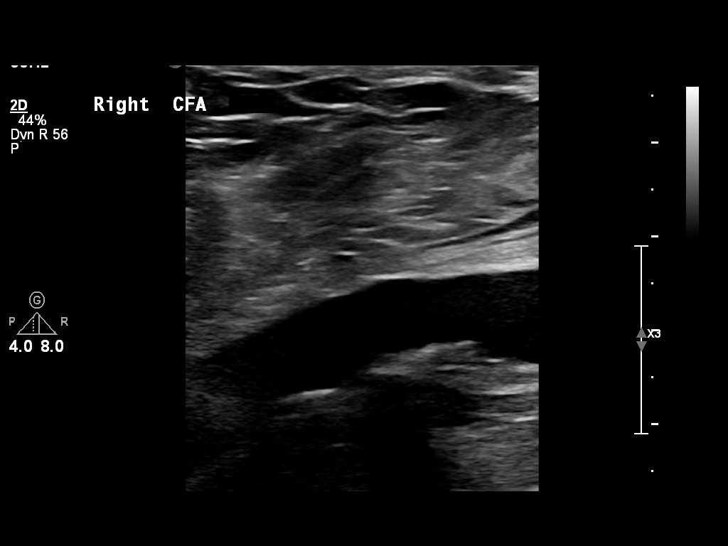
[im 9/34]
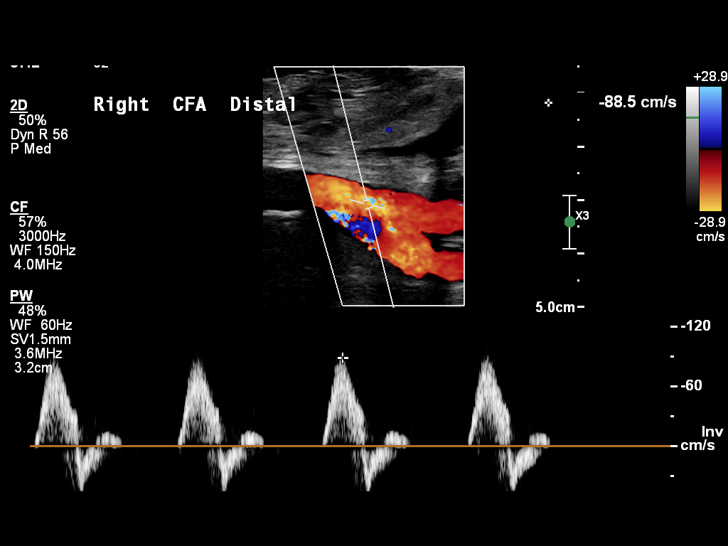
[im 12/34]
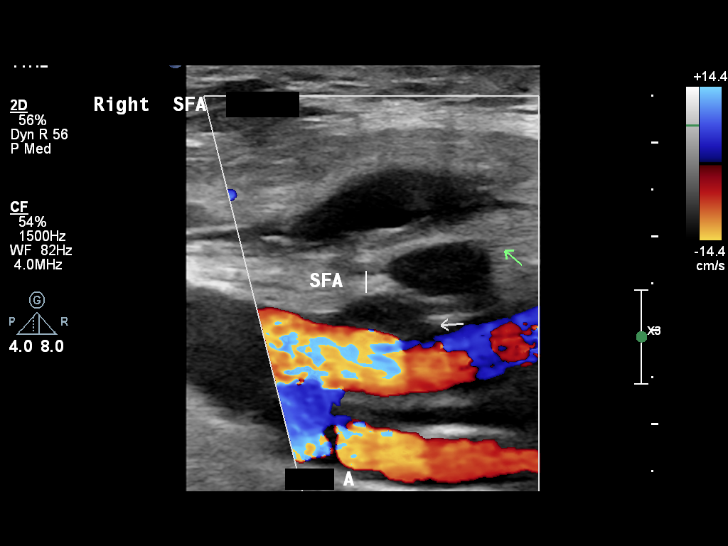
[im 14/34]
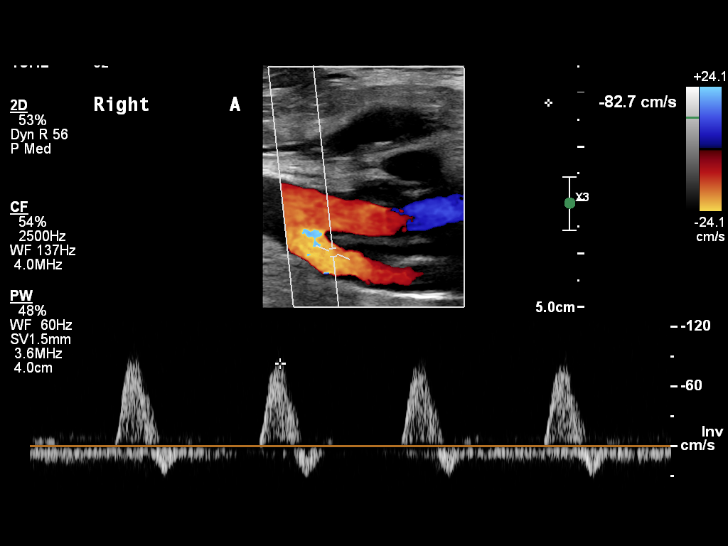
[im 18/34]
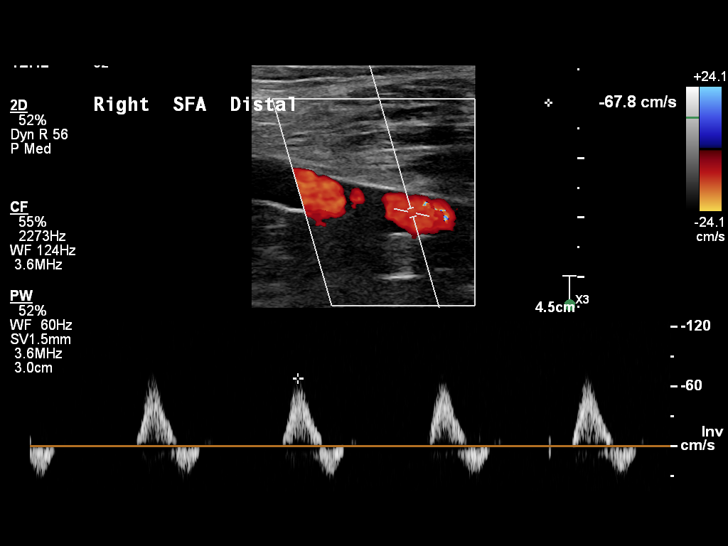
[im 20/34]
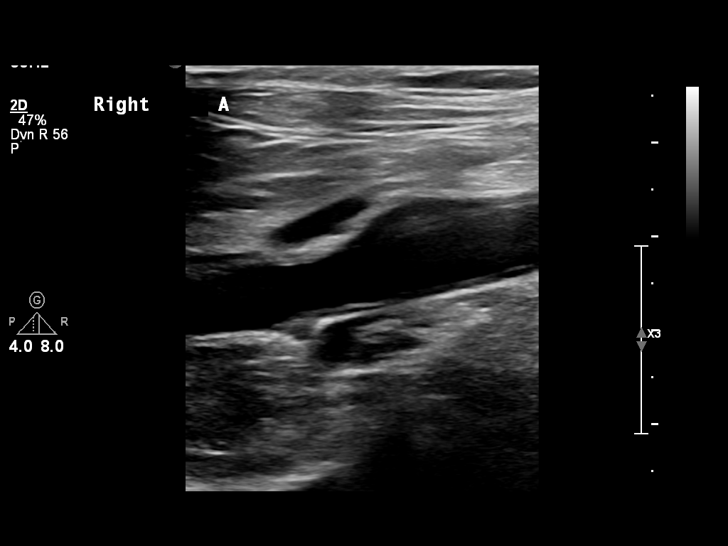
[im 23/34]
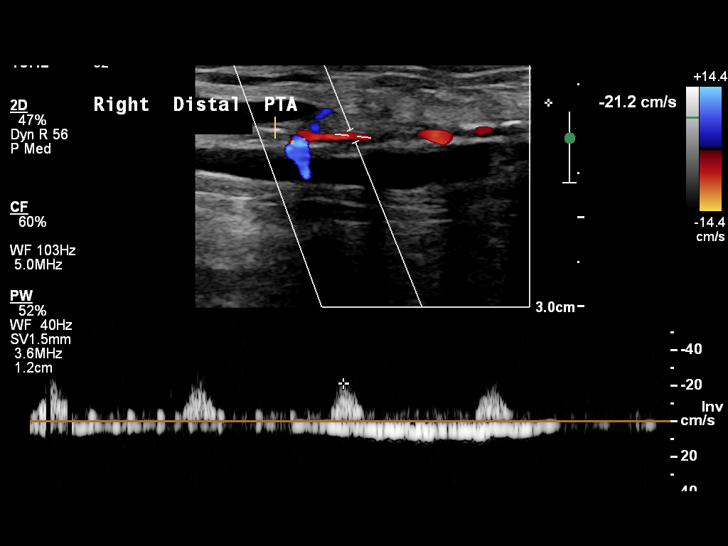
[im 25/34]
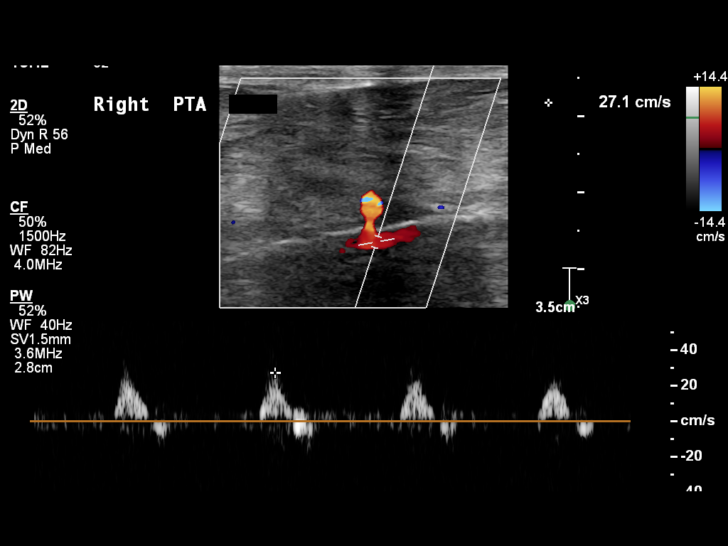
[im 27/34]
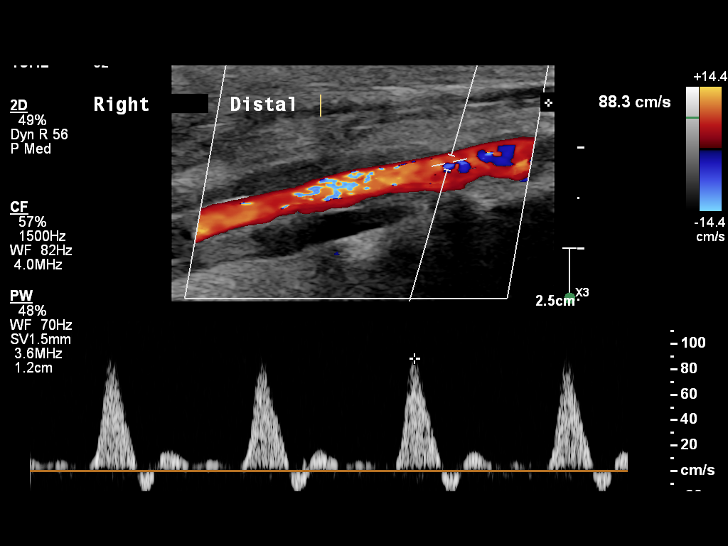
[im 31/34]
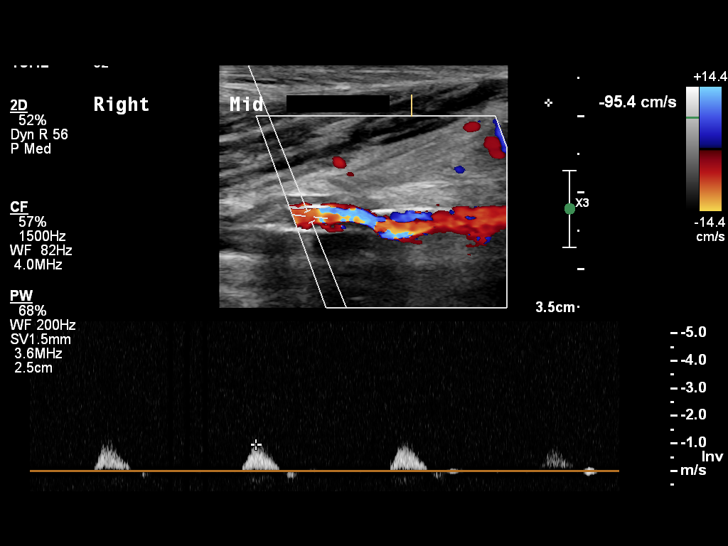
[im 34/34]
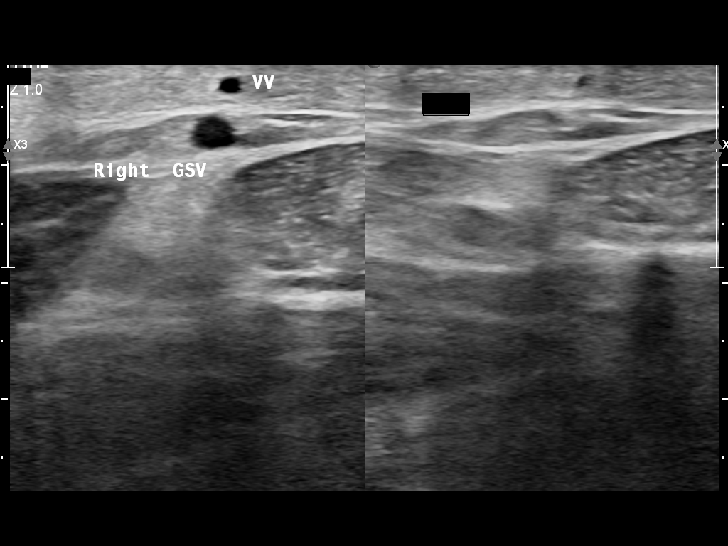

[13 of 16 positions shown; findings below may reference images not displayed]

FINDINGS: RIGHT:
On color Doppler, there are 2 heterogeneous masses that do not demonstrate color flow anterior to the common femoral artery and proximal superficial femoral artery concerning for possible hematoma. Right common femoral artery is patent with triphasic waveforms. The common femoral artery demonstrates velocities of 101 cm/s proximally and 89 cm/s distally. The profunda femoris artery demonstrates velocities of 83 cm/s. There does appear to be a hematoma in some of these fluid collections that do not demonstrate flow over the proximal superficial femoral artery concerning for possible hematoma. The origin of the right SFA demonstrates triphasic waveforms and velocities of 91 cm/s. The right SFA demonstrates triphasic to biphasic waveforms throughout with velocities proximally of 74 cm/s, mid 71 cm/s and distally 68 cm/s. The popliteal artery above the knee demonstrates triphasic waveforms and velocities of 89 cm/s and distally velocities of around 80 cm/s. The posterior tibial artery is
occluded but there does appear to be distal reconstituted flow with velocities of 21 cm/s. The proximal posterior tibial artery is patent with velocities of 27 cm/s. The peroneal artery distally demonstrates velocities of 24 cm/s. The anterior tibial artery distally demonstrates triphasic waveforms. The proximal anterior tibial artery velocities are 114 cm/s and mid 459 cm/s. The right dorsalis pedis demonstrates velocities of 146 cm/s.
IMPRESSION: 1. Several fluid collections over the proximal right superficial femoral artery concerning for hematoma. No evidence of pseudoaneurysm is appreciated. The posterior tibial artery is occluded distally with what appears to be reconstituted flow via collaterals. Diminished velocities within the peroneal artery. The dominant runoff is via the anterior tibial artery although there is a high-grade stenosis in the mid anterior tibial artery.

## 2023-11-18 IMAGING — US US LOW EXT ARTERIES RIGHT
1 series · 13 of 15 positions shown · non-contrast
Comparison: none

FINAL REPORT:
REASON FOR EXAM: Peripheral arterial disease
STUDY PERFORMED: Right lower extremity arterial duplex.

[Series 1: us low ext arteries right · 13 of 15 slices shown]
[im 1/15]
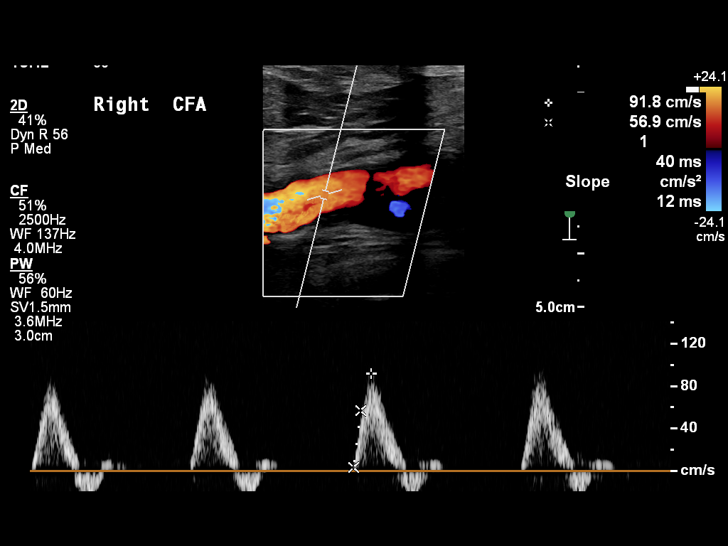
[im 2/15]
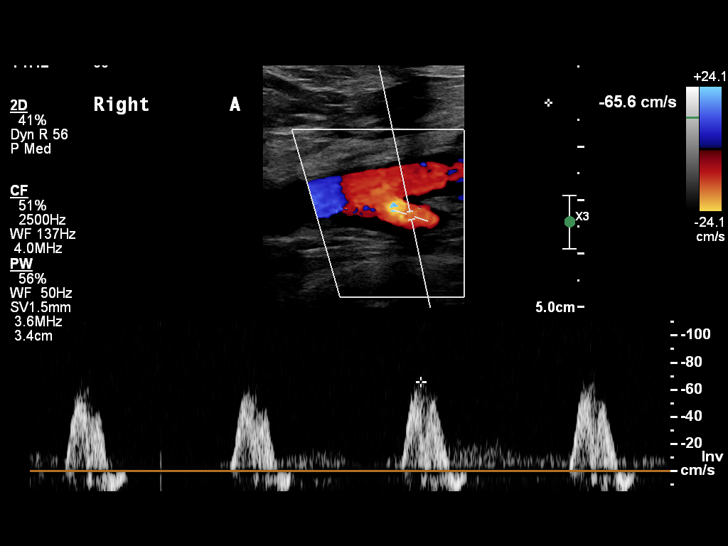
[im 3/15]
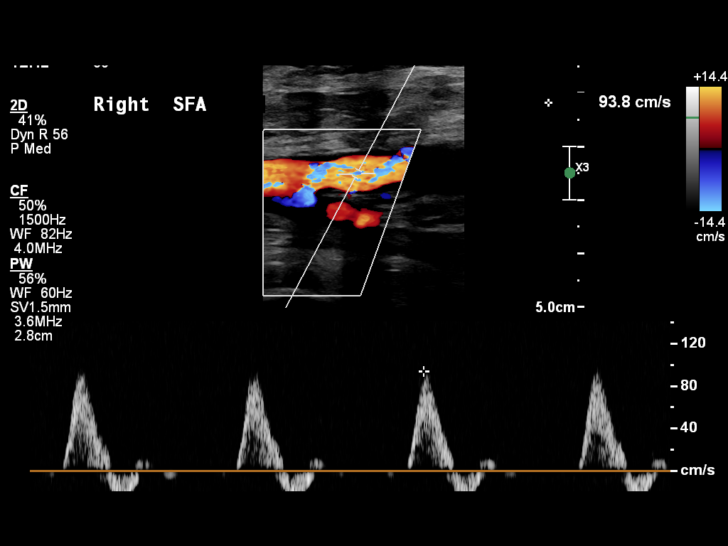
[im 5/15]
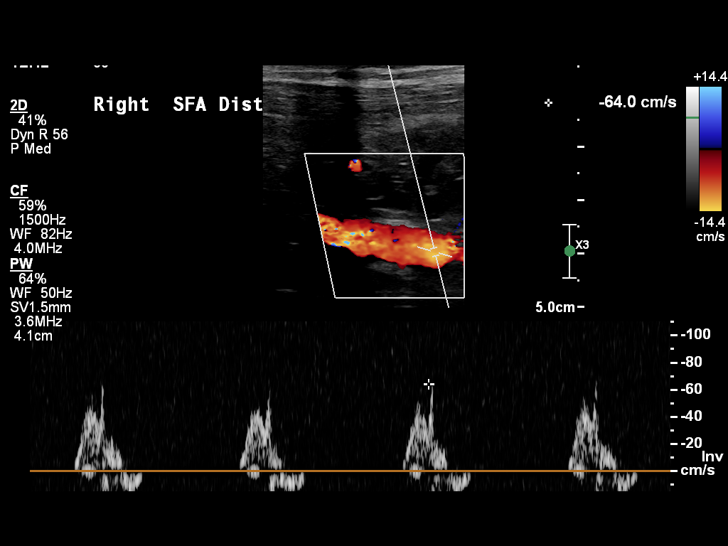
[im 6/15]
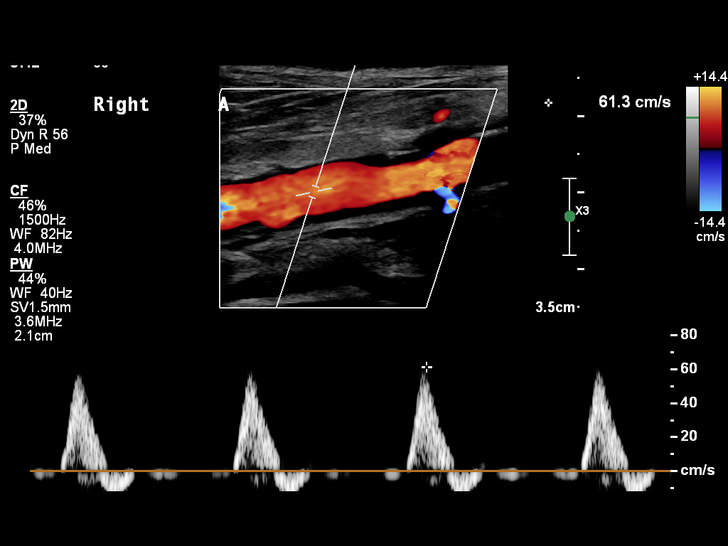
[im 7/15]
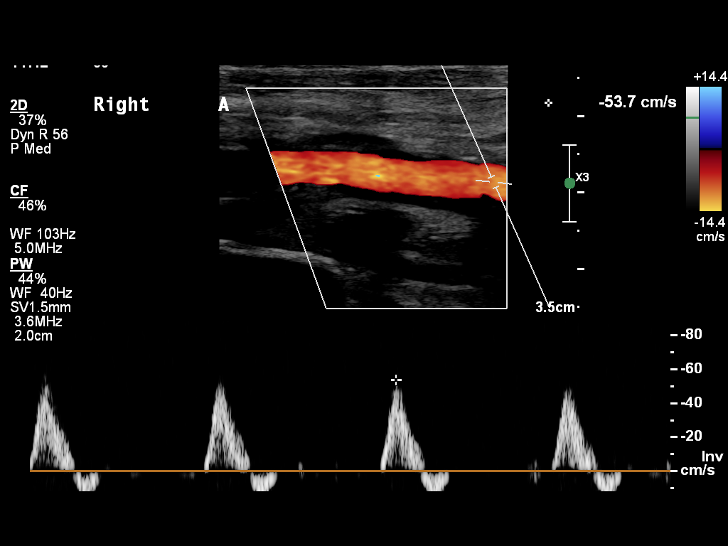
[im 8/15]
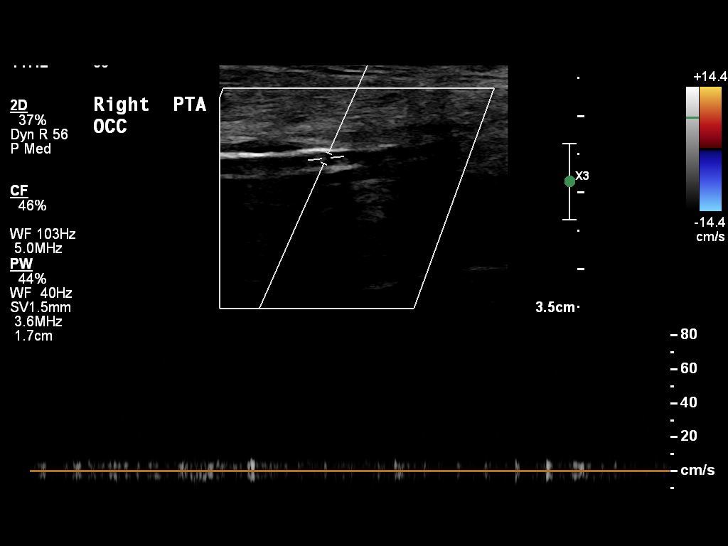
[im 9/15]
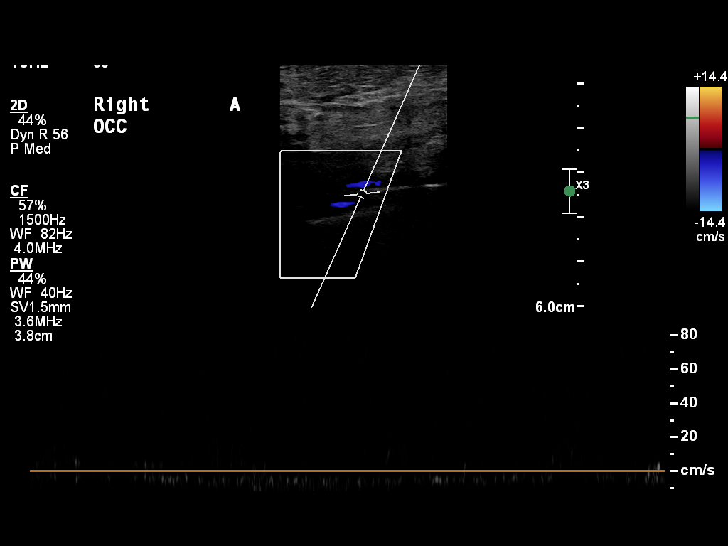
[im 10/15]
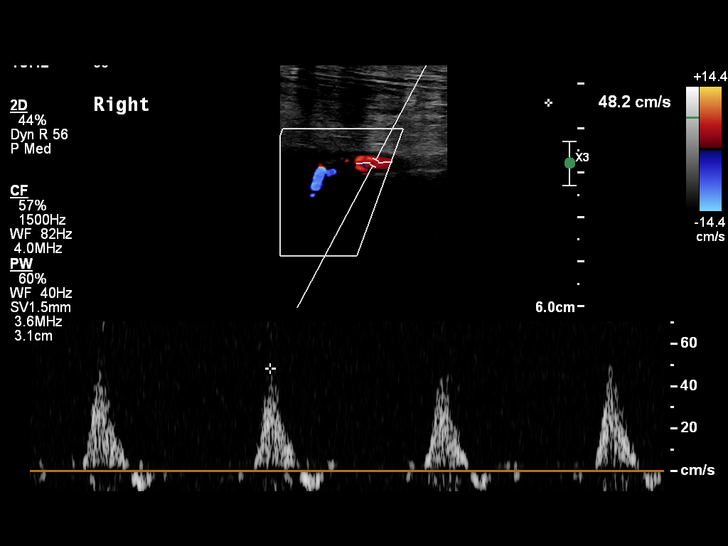
[im 11/15]
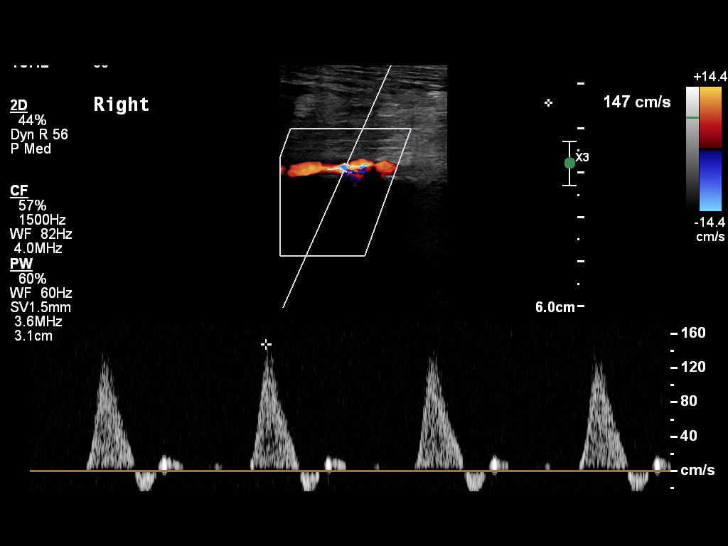
[im 13/15]
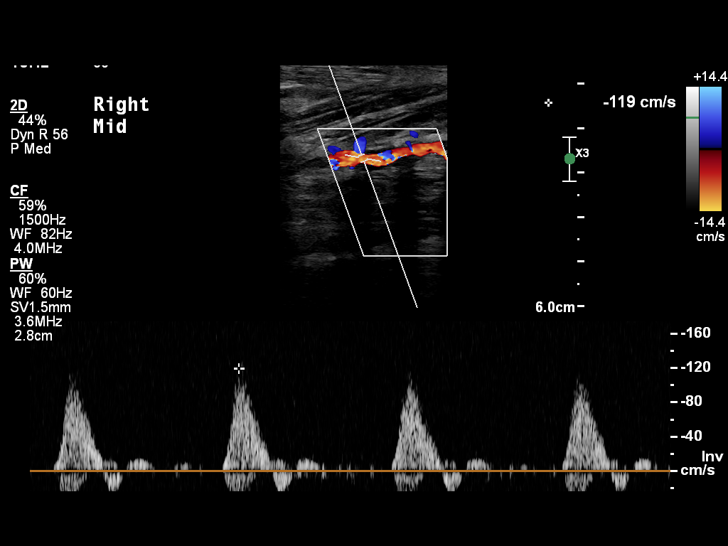
[im 14/15]
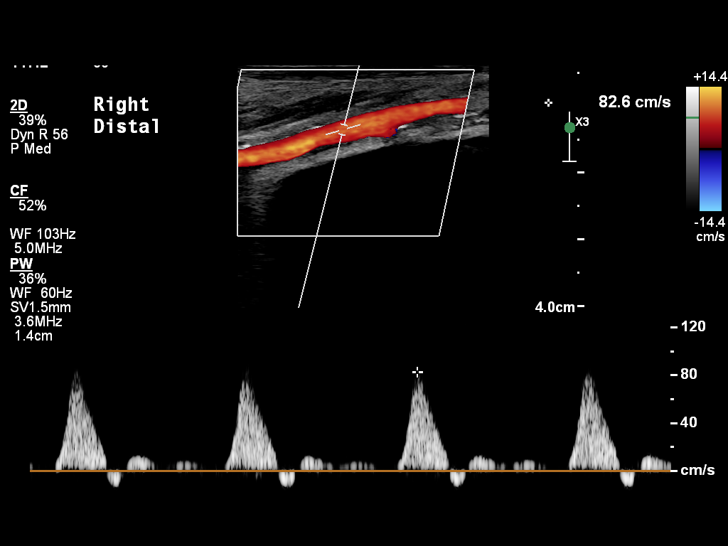
[im 15/15]
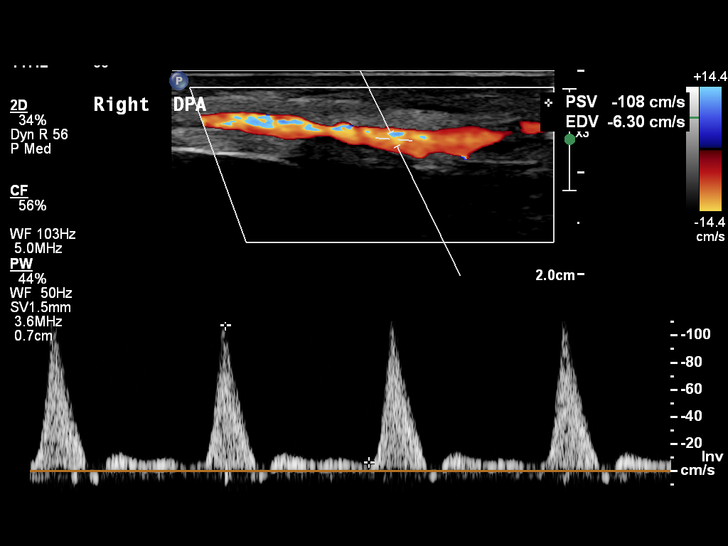

[13 of 15 positions shown; findings below may reference images not displayed]

FINDINGS: RIGHT:
Right common femoral artery is patent with triphasic waveforms. The common femoral artery demonstrates velocities of 92 cm/s. The profunda femoris artery demonstrates velocities of 66 cm/s. The right superficial femoral artery demonstrates triphasic to biphasic waveforms throughout with velocities proximally 94 cm/s, mid 77 cm/s and distally 64 cm/s. The popliteal artery demonstrates normal triphasic waveforms and velocities of 61 cm/s. The posterior tibial artery and peroneal artery are occluded. The proximal anterior tibial artery velocities are 48 cm/s which increase up slightly to 147 cm/s. The mid anterior tibial artery demonstrates triphasic waveforms and velocities of 119 cm/s and distally 83 cm/s. The dorsalis pedis demonstrates velocities of 108 cm/s.
IMPRESSION: 1. Likely mild stenosis noted within the proximal right anterior tibial artery although likely not hemodynamically significant as the velocities and waveforms distally are maintained. The peroneal and posterior tibial artery are occluded. No other significant stenosis or occlusion identified.
